# Patient Record
Sex: Female | Born: 1960 | Race: Black or African American | Hispanic: No | Marital: Single | Smoking: Current every day smoker
Health system: Southern US, Community
[De-identification: ages and names within clinical notes are randomized; demographics above are authoritative.]

## PROBLEM LIST (undated history)

## (undated) DIAGNOSIS — I10 Essential (primary) hypertension: Secondary | ICD-10-CM

## (undated) DIAGNOSIS — G43909 Migraine, unspecified, not intractable, without status migrainosus: Secondary | ICD-10-CM

## (undated) DIAGNOSIS — C801 Malignant (primary) neoplasm, unspecified: Secondary | ICD-10-CM

## (undated) DIAGNOSIS — G4733 Obstructive sleep apnea (adult) (pediatric): Secondary | ICD-10-CM

## (undated) DIAGNOSIS — F32A Depression, unspecified: Secondary | ICD-10-CM

## (undated) DIAGNOSIS — Z923 Personal history of irradiation: Secondary | ICD-10-CM

## (undated) DIAGNOSIS — T7840XA Allergy, unspecified, initial encounter: Secondary | ICD-10-CM

## (undated) DIAGNOSIS — R519 Headache, unspecified: Secondary | ICD-10-CM

## (undated) DIAGNOSIS — E785 Hyperlipidemia, unspecified: Secondary | ICD-10-CM

## (undated) DIAGNOSIS — F419 Anxiety disorder, unspecified: Secondary | ICD-10-CM

## (undated) DIAGNOSIS — C50919 Malignant neoplasm of unspecified site of unspecified female breast: Secondary | ICD-10-CM

## (undated) DIAGNOSIS — IMO0001 Reserved for inherently not codable concepts without codable children: Secondary | ICD-10-CM

## (undated) DIAGNOSIS — K5900 Constipation, unspecified: Secondary | ICD-10-CM

## (undated) DIAGNOSIS — R51 Headache: Secondary | ICD-10-CM

## (undated) DIAGNOSIS — F329 Major depressive disorder, single episode, unspecified: Secondary | ICD-10-CM

## (undated) DIAGNOSIS — E7800 Pure hypercholesterolemia, unspecified: Secondary | ICD-10-CM

## (undated) DIAGNOSIS — IMO0002 Reserved for concepts with insufficient information to code with codable children: Secondary | ICD-10-CM

## (undated) DIAGNOSIS — R921 Mammographic calcification found on diagnostic imaging of breast: Secondary | ICD-10-CM

## (undated) DIAGNOSIS — R569 Unspecified convulsions: Secondary | ICD-10-CM

## (undated) HISTORY — DX: Allergy, unspecified, initial encounter: T78.40XA

## (undated) HISTORY — DX: Reserved for concepts with insufficient information to code with codable children: IMO0002

## (undated) HISTORY — DX: Constipation, unspecified: K59.00

## (undated) HISTORY — PX: OTHER SURGICAL HISTORY: SHX169

## (undated) HISTORY — DX: Malignant (primary) neoplasm, unspecified: C80.1

## (undated) HISTORY — DX: Hyperlipidemia, unspecified: E78.5

## (undated) HISTORY — DX: Reserved for inherently not codable concepts without codable children: IMO0001

## (undated) HISTORY — DX: Unspecified convulsions: R56.9

## (undated) HISTORY — PX: COLONOSCOPY: SHX174

## (undated) HISTORY — DX: Obstructive sleep apnea (adult) (pediatric): G47.33

---

## 1998-08-15 HISTORY — PX: KNEE ARTHROSCOPY: SHX127

## 2001-03-22 ENCOUNTER — Other Ambulatory Visit: Admission: RE | Admit: 2001-03-22 | Discharge: 2001-03-22 | Payer: Self-pay | Admitting: *Deleted

## 2001-09-28 ENCOUNTER — Other Ambulatory Visit: Admission: RE | Admit: 2001-09-28 | Discharge: 2001-09-28 | Payer: Self-pay | Admitting: Obstetrics and Gynecology

## 2002-06-13 ENCOUNTER — Other Ambulatory Visit: Admission: RE | Admit: 2002-06-13 | Discharge: 2002-06-13 | Payer: Self-pay | Admitting: Obstetrics and Gynecology

## 2003-06-16 ENCOUNTER — Other Ambulatory Visit: Admission: RE | Admit: 2003-06-16 | Discharge: 2003-06-16 | Payer: Self-pay | Admitting: *Deleted

## 2003-06-16 ENCOUNTER — Other Ambulatory Visit: Admission: RE | Admit: 2003-06-16 | Discharge: 2003-06-16 | Payer: Self-pay | Admitting: Obstetrics and Gynecology

## 2004-05-18 ENCOUNTER — Encounter: Admission: RE | Admit: 2004-05-18 | Discharge: 2004-05-18 | Payer: Self-pay | Admitting: Emergency Medicine

## 2004-06-16 ENCOUNTER — Other Ambulatory Visit: Admission: RE | Admit: 2004-06-16 | Discharge: 2004-06-16 | Payer: Self-pay | Admitting: Obstetrics and Gynecology

## 2004-12-20 ENCOUNTER — Emergency Department (HOSPITAL_COMMUNITY): Admission: EM | Admit: 2004-12-20 | Discharge: 2004-12-20 | Payer: Self-pay | Admitting: *Deleted

## 2005-07-14 ENCOUNTER — Encounter: Admission: RE | Admit: 2005-07-14 | Discharge: 2005-07-14 | Payer: Self-pay | Admitting: Emergency Medicine

## 2005-07-18 ENCOUNTER — Other Ambulatory Visit: Admission: RE | Admit: 2005-07-18 | Discharge: 2005-07-18 | Payer: Self-pay | Admitting: Obstetrics and Gynecology

## 2006-08-18 ENCOUNTER — Encounter: Admission: RE | Admit: 2006-08-18 | Discharge: 2006-08-18 | Payer: Self-pay | Admitting: Emergency Medicine

## 2006-08-29 ENCOUNTER — Other Ambulatory Visit: Admission: RE | Admit: 2006-08-29 | Discharge: 2006-08-29 | Payer: Self-pay | Admitting: Obstetrics & Gynecology

## 2006-10-06 ENCOUNTER — Other Ambulatory Visit: Admission: RE | Admit: 2006-10-06 | Discharge: 2006-10-06 | Payer: Self-pay | Admitting: Obstetrics and Gynecology

## 2007-02-05 ENCOUNTER — Other Ambulatory Visit: Admission: RE | Admit: 2007-02-05 | Discharge: 2007-02-05 | Payer: Self-pay | Admitting: Obstetrics and Gynecology

## 2007-08-20 ENCOUNTER — Other Ambulatory Visit: Admission: RE | Admit: 2007-08-20 | Discharge: 2007-08-20 | Payer: Self-pay | Admitting: Obstetrics & Gynecology

## 2007-09-19 ENCOUNTER — Encounter: Admission: RE | Admit: 2007-09-19 | Discharge: 2007-09-19 | Payer: Self-pay | Admitting: Emergency Medicine

## 2008-10-06 ENCOUNTER — Other Ambulatory Visit: Admission: RE | Admit: 2008-10-06 | Discharge: 2008-10-06 | Payer: Self-pay | Admitting: Obstetrics and Gynecology

## 2008-10-15 ENCOUNTER — Encounter: Admission: RE | Admit: 2008-10-15 | Discharge: 2008-10-15 | Payer: Self-pay | Admitting: Emergency Medicine

## 2008-10-21 ENCOUNTER — Encounter: Admission: RE | Admit: 2008-10-21 | Discharge: 2008-10-21 | Payer: Self-pay | Admitting: Emergency Medicine

## 2009-10-19 ENCOUNTER — Encounter: Admission: RE | Admit: 2009-10-19 | Discharge: 2009-10-19 | Payer: Self-pay | Admitting: Obstetrics and Gynecology

## 2010-08-15 HISTORY — PX: VAGINAL HYSTERECTOMY: SUR661

## 2010-09-30 ENCOUNTER — Other Ambulatory Visit: Payer: Self-pay | Admitting: Family Medicine

## 2010-09-30 DIAGNOSIS — N6459 Other signs and symptoms in breast: Secondary | ICD-10-CM

## 2010-09-30 DIAGNOSIS — N63 Unspecified lump in unspecified breast: Secondary | ICD-10-CM

## 2010-10-06 ENCOUNTER — Other Ambulatory Visit: Payer: Self-pay | Admitting: Family Medicine

## 2010-10-06 ENCOUNTER — Ambulatory Visit
Admission: RE | Admit: 2010-10-06 | Discharge: 2010-10-06 | Disposition: A | Discharge status: Home / Self Care | Payer: Commercial Indemnity | Source: Ambulatory Visit | Attending: Family Medicine | Admitting: Family Medicine

## 2010-10-06 DIAGNOSIS — N63 Unspecified lump in unspecified breast: Secondary | ICD-10-CM

## 2010-10-06 DIAGNOSIS — N6320 Unspecified lump in the left breast, unspecified quadrant: Secondary | ICD-10-CM

## 2010-10-07 ENCOUNTER — Ambulatory Visit
Admission: RE | Admit: 2010-10-07 | Discharge: 2010-10-07 | Disposition: A | Discharge status: Home / Self Care | Payer: Commercial Indemnity | Source: Ambulatory Visit | Attending: Family Medicine | Admitting: Family Medicine

## 2010-10-07 ENCOUNTER — Other Ambulatory Visit: Payer: Self-pay | Admitting: Diagnostic Radiology

## 2010-10-07 DIAGNOSIS — N6320 Unspecified lump in the left breast, unspecified quadrant: Secondary | ICD-10-CM

## 2011-03-23 NOTE — Patient Instructions (Addendum)
20 Eileen Holland  03/24/2011   Your procedure is scheduled on:  03/29/11  Report to [facility] at 6 AM.  Call this number if you have problems the morning of surgery: [phone]   Remember:   Do not eat food:After Midnight.  Do not drink clear liquids: After Midnight.  Take these medicines the morning of surgery with A SIP OF WATER: all blood pressure medications   Do not wear jewelry, make-up or nail polish.  Do not wear lotions, powders, or perfumes. You may wear deodorant.  Do not shave 48 hours prior to surgery.  Do not bring valuables to the hospital.  Contacts, dentures or bridgework may not be worn into surgery.  Leave suitcase in the car. After surgery it may be brought to your room.  For patients admitted to the hospital, checkout time is 11:00 AM the day of discharge.   Patients discharged the day of surgery will not be allowed to drive home.  Name and phone number of your driver: NA  Special Instructions: use CHG wash as directed on written instruction sheet   Please read over the following fact sheets that you were given: MRSA Information

## 2011-03-24 ENCOUNTER — Encounter (HOSPITAL_COMMUNITY): Payer: Self-pay

## 2011-03-24 ENCOUNTER — Encounter (HOSPITAL_COMMUNITY)
Admission: RE | Admit: 2011-03-24 | Discharge: 2011-03-24 | Disposition: A | Discharge status: Home / Self Care | Payer: Managed Care, Other (non HMO) | Source: Ambulatory Visit | Attending: Obstetrics and Gynecology | Admitting: Obstetrics and Gynecology

## 2011-03-24 ENCOUNTER — Other Ambulatory Visit: Payer: Self-pay

## 2011-03-24 HISTORY — DX: Anxiety disorder, unspecified: F41.9

## 2011-03-24 HISTORY — DX: Major depressive disorder, single episode, unspecified: F32.9

## 2011-03-24 HISTORY — DX: Depression, unspecified: F32.A

## 2011-03-24 HISTORY — DX: Essential (primary) hypertension: I10

## 2011-03-24 LAB — CBC
HCT: 39 % (ref 36.0–46.0)
Hemoglobin: 13.1 g/dL (ref 12.0–15.0)
RBC: 3.66 MIL/uL — ABNORMAL LOW (ref 3.87–5.11)

## 2011-03-24 LAB — COMPREHENSIVE METABOLIC PANEL
ALT: 8 U/L (ref 0–35)
Alkaline Phosphatase: 61 U/L (ref 39–117)
CO2: 32 mEq/L (ref 19–32)
Chloride: 99 mEq/L (ref 96–112)
GFR calc Af Amer: >60 mL/min (ref >60)
GFR calc non Af Amer: >60 mL/min (ref >60)
Glucose, Bld: 95 mg/dL (ref 70–99)
Potassium: 4 mEq/L (ref 3.5–5.1)
Sodium: 135 mEq/L (ref 135–145)
Total Bilirubin: 0.4 mg/dL (ref 0.3–1.2)
Total Protein: 7.5 g/dL (ref 6.0–8.3)

## 2011-03-24 NOTE — Anesthesia Preprocedure Evaluation (Signed)
Anesthesia Evaluation  Name, MR# and DOB Patient awake  General Assessment Comment  Reviewed: Allergy & Precautions, H&P , Patient's Chart, lab work & pertinent test results, reviewed documented beta blocker date and time   History of Anesthesia Complications Negative for: history of anesthetic complications  Airway Mallampati: II TM Distance: >3 FB Neck ROM: full    Dental No notable dental hx.    Pulmonary  clear to auscultation  pulmonary exam normalPulmonary Exam Normal breath sounds clear to auscultation none    Cardiovascular Exercise Tolerance: Good hypertension, regular Normal    Neuro/Psych Negative Neurological ROS  Negative Psych ROS  GI/Hepatic/Renal negative GI ROS, negative Liver ROS, and negative Renal ROS (+)       Endo/Other  Negative Endocrine ROS (+)      Abdominal   Musculoskeletal   Hematology negative hematology ROS (+)   Peds  Reproductive/Obstetrics negative OB ROS    Anesthesia Other Findings             Anesthesia Physical Anesthesia Plan  ASA: II  Anesthesia Plan: General   Post-op Pain Management:    Induction:   Airway Management Planned:   Additional Equipment:   Intra-op Plan:   Post-operative Plan:   Informed Consent: I have reviewed the patients History and Physical, chart, labs and discussed the procedure including the risks, benefits and alternatives for the proposed anesthesia with the patient or authorized representative who has indicated his/her understanding and acceptance.   Dental Advisory Given  Plan Discussed with: CRNA and Surgeon  Anesthesia Plan Comments:         Anesthesia Quick Evaluation

## 2011-03-28 MED ORDER — DEXTROSE 5 % IV SOLN
2.0000 g | Freq: Once | INTRAVENOUS | Status: AC
  Administered 2011-03-29: 2 g via INTRAVENOUS
  Filled 2011-03-28: qty 2

## 2011-03-28 NOTE — H&P (Signed)
Eileen Holland is an 50 y.o. femalePatient has an enlarging uterus, and is now having pain into her buttock that may be from the bulk of the uterus. She has numerous fibroids ranging from 1 to 4 cm each, and her uterus is 10 x 6 x 10 cm on PUS.  The right ovary had a 3.7 cm clear cyst.  Left nl.     Pertinent Gynecological History: Menses: 2 times per month with associated pain. Bleeding: dysfunctional uterine bleeding Contraception:micronor OB History: G1 P1 SVD   Menstrual History:  No LMP recorded. 03/05/2011  Past Medical History  Diagnosis Date  . Hypertension   . Depression   . Anxiety     Past Surgical History  Procedure Date  . Knee arthroscopy 2000    No family history on file.  Social History:  reports that she has been smoking.  She does not have any smokeless tobacco history on file. She reports that she drinks alcohol. She reports that she does not use illicit drugs.  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  Not sexually active.  Allergies:  Allergies  Allergen Reactions  . Aspirin Hives  . Ibuprofen Hives    No prescriptions prior to admission    Review of Systems  All other systems reviewed and are negative.    There were no vitals taken for this visit.5 feet 3 inches, 149 pounds. Physical Exam  Constitutional: She is oriented to person, place, and time. She appears well-developed and well-nourished.  HENT:  Head: Normocephalic and atraumatic.  Eyes: Pupils are equal, round, and reactive to light.  Neck: Normal range of motion. Neck supple.  Cardiovascular: Normal rate and regular rhythm.   Respiratory: Effort normal and breath sounds normal.  GI: Soft. Bowel sounds are normal.  Genitourinary: Vagina normal.       Uterus 14-16 week size, nodular, mobile.  Adnexa neg.  Musculoskeletal: Normal range of motion.  Neurological: She is alert and oriented to person, place, and time.  Skin: Skin is warm and dry.  Psychiatric: She has a normal mood and affect.    No  results found for this or any previous visit (from the past 24 hour(s)).  No results found.  Assessment/Plan: 50 yr old SBF with enlarging and symptomatic fibroids, for R TLH.  Plan to retain ovaries if no.   ROMINE,CYNTHIA P 03/28/2011, 5:25 PM

## 2011-03-29 ENCOUNTER — Other Ambulatory Visit: Payer: Self-pay | Admitting: Obstetrics and Gynecology

## 2011-03-29 ENCOUNTER — Ambulatory Visit (HOSPITAL_COMMUNITY): Payer: Managed Care, Other (non HMO) | Admitting: Anesthesiology

## 2011-03-29 ENCOUNTER — Encounter (HOSPITAL_COMMUNITY): Payer: Self-pay | Admitting: Anesthesiology

## 2011-03-29 ENCOUNTER — Encounter (HOSPITAL_COMMUNITY): Payer: Self-pay | Admitting: *Deleted

## 2011-03-29 ENCOUNTER — Encounter (HOSPITAL_COMMUNITY)
Admission: RE | Disposition: A | Discharge status: Home / Self Care | Payer: Self-pay | Source: Ambulatory Visit | Attending: Obstetrics and Gynecology

## 2011-03-29 ENCOUNTER — Ambulatory Visit (HOSPITAL_COMMUNITY)
Admission: RE | Admit: 2011-03-29 | Discharge: 2011-03-29 | Disposition: A | Discharge status: Home / Self Care | Payer: Managed Care, Other (non HMO) | Source: Ambulatory Visit | Attending: Obstetrics and Gynecology | Admitting: Obstetrics and Gynecology

## 2011-03-29 DIAGNOSIS — Z01812 Encounter for preprocedural laboratory examination: Secondary | ICD-10-CM | POA: Insufficient documentation

## 2011-03-29 DIAGNOSIS — N920 Excessive and frequent menstruation with regular cycle: Secondary | ICD-10-CM | POA: Insufficient documentation

## 2011-03-29 DIAGNOSIS — Z01818 Encounter for other preprocedural examination: Secondary | ICD-10-CM | POA: Insufficient documentation

## 2011-03-29 DIAGNOSIS — D259 Leiomyoma of uterus, unspecified: Secondary | ICD-10-CM | POA: Insufficient documentation

## 2011-03-29 SURGERY — ROBOTIC ASSISTED TOTAL HYSTERECTOMY
Anesthesia: General | Site: Abdomen | Wound class: Clean Contaminated

## 2011-03-29 MED ORDER — NEOSTIGMINE METHYLSULFATE 1 MG/ML IJ SOLN
INTRAMUSCULAR | Status: AC
  Filled 2011-03-29: qty 10

## 2011-03-29 MED ORDER — SODIUM CHLORIDE 0.9 % IV SOLN
250.0000 mL | INTRAVENOUS | Status: DC
  Administered 2011-03-29: 10 mL via INTRAVENOUS

## 2011-03-29 MED ORDER — SODIUM CHLORIDE 0.9 % IJ SOLN: 3.0000 mL | INTRAMUSCULAR | Status: DC | PRN

## 2011-03-29 MED ORDER — DEXAMETHASONE SODIUM PHOSPHATE 10 MG/ML IJ SOLN
INTRAMUSCULAR | Status: DC | PRN
  Administered 2011-03-29: 10 mg via INTRAVENOUS

## 2011-03-29 MED ORDER — ONDANSETRON HCL 4 MG/2ML IJ SOLN
INTRAMUSCULAR | Status: DC | PRN
  Administered 2011-03-29: 4 mg via INTRAVENOUS

## 2011-03-29 MED ORDER — GLYCOPYRROLATE 0.2 MG/ML IJ SOLN
INTRAMUSCULAR | Status: DC | PRN
  Administered 2011-03-29: .6 mg via INTRAVENOUS

## 2011-03-29 MED ORDER — MIDAZOLAM HCL 5 MG/5ML IJ SOLN
INTRAMUSCULAR | Status: DC | PRN
  Administered 2011-03-29: 2 mg via INTRAVENOUS

## 2011-03-29 MED ORDER — PROMETHAZINE HCL 25 MG/ML IJ SOLN: 12.5000 mg | INTRAMUSCULAR | Status: DC | PRN

## 2011-03-29 MED ORDER — FENTANYL CITRATE 0.05 MG/ML IJ SOLN
25.0000 ug | INTRAMUSCULAR | Status: DC | PRN
  Administered 2011-03-29: 25 ug via INTRAVENOUS

## 2011-03-29 MED ORDER — LACTATED RINGERS IR SOLN
Status: DC | PRN
  Administered 2011-03-29: 3000 mL

## 2011-03-29 MED ORDER — SUFENTANIL CITRATE 50 MCG/ML IV SOLN
INTRAVENOUS | Status: DC | PRN
  Administered 2011-03-29: 20 ug via INTRAVENOUS
  Administered 2011-03-29 (×3): 10 ug via INTRAVENOUS

## 2011-03-29 MED ORDER — PROPOFOL 10 MG/ML IV EMUL
INTRAVENOUS | Status: AC
  Filled 2011-03-29: qty 40

## 2011-03-29 MED ORDER — MIDAZOLAM HCL 2 MG/2ML IJ SOLN
INTRAMUSCULAR | Status: AC
  Filled 2011-03-29: qty 2

## 2011-03-29 MED ORDER — SCOPOLAMINE 1 MG/3DAYS TD PT72
MEDICATED_PATCH | TRANSDERMAL | Status: AC
  Filled 2011-03-29: qty 1

## 2011-03-29 MED ORDER — SCOPOLAMINE 1 MG/3DAYS TD PT72: 1.0000 | MEDICATED_PATCH | TRANSDERMAL | Status: DC

## 2011-03-29 MED ORDER — LIDOCAINE HCL (CARDIAC) 20 MG/ML IV SOLN
INTRAVENOUS | Status: AC
  Filled 2011-03-29: qty 5

## 2011-03-29 MED ORDER — FENTANYL CITRATE 0.05 MG/ML IJ SOLN
INTRAMUSCULAR | Status: AC
  Filled 2011-03-29: qty 2

## 2011-03-29 MED ORDER — DEXAMETHASONE SODIUM PHOSPHATE 10 MG/ML IJ SOLN
INTRAMUSCULAR | Status: AC
  Filled 2011-03-29: qty 1

## 2011-03-29 MED ORDER — MORPHINE SULFATE 4 MG/ML IJ SOLN: 1.0000 mg | INTRAMUSCULAR | Status: DC | PRN

## 2011-03-29 MED ORDER — SUFENTANIL CITRATE 50 MCG/ML IV SOLN
INTRAVENOUS | Status: AC
  Filled 2011-03-29: qty 1

## 2011-03-29 MED ORDER — SODIUM CHLORIDE 0.9 % IV SOLN: 250.0000 mL | INTRAVENOUS | Status: DC

## 2011-03-29 MED ORDER — LIDOCAINE HCL (CARDIAC) 20 MG/ML IV SOLN
INTRAVENOUS | Status: DC | PRN
  Administered 2011-03-29: 60 mg via INTRAVENOUS

## 2011-03-29 MED ORDER — GLYCOPYRROLATE 0.2 MG/ML IJ SOLN
INTRAMUSCULAR | Status: AC
  Filled 2011-03-29: qty 2

## 2011-03-29 MED ORDER — DEXTROSE IN LACTATED RINGERS 5 % IV SOLN: INTRAVENOUS | Status: DC

## 2011-03-29 MED ORDER — ROCURONIUM BROMIDE 50 MG/5ML IV SOLN
INTRAVENOUS | Status: AC
  Filled 2011-03-29: qty 1

## 2011-03-29 MED ORDER — NEOSTIGMINE METHYLSULFATE 1 MG/ML IJ SOLN
INTRAMUSCULAR | Status: DC | PRN
  Administered 2011-03-29: 3 mg via INTRAMUSCULAR

## 2011-03-29 MED ORDER — ONDANSETRON HCL 4 MG/2ML IJ SOLN: 4.0000 mg | Freq: Once | INTRAMUSCULAR | Status: DC | PRN

## 2011-03-29 MED ORDER — LACTATED RINGERS IV SOLN
INTRAVENOUS | Status: DC
Start: 2011-03-29 — End: 2011-03-29
  Administered 2011-03-29 (×3): via INTRAVENOUS

## 2011-03-29 MED ORDER — ROPIVACAINE HCL 5 MG/ML IJ SOLN
INTRAMUSCULAR | Status: DC | PRN
  Administered 2011-03-29: 110 mL

## 2011-03-29 MED ORDER — SODIUM CHLORIDE 0.9 % IJ SOLN: 3.0000 mL | Freq: Two times a day (BID) | INTRAMUSCULAR | Status: DC

## 2011-03-29 MED ORDER — PROPOFOL 10 MG/ML IV EMUL
INTRAVENOUS | Status: DC | PRN
  Administered 2011-03-29: 50 mg via INTRAVENOUS
  Administered 2011-03-29: 150 mg via INTRAVENOUS

## 2011-03-29 MED ORDER — OXYCODONE-ACETAMINOPHEN 5-325 MG PO TABS
1.0000 | ORAL_TABLET | ORAL | Status: DC | PRN
Start: 2011-03-29 — End: 2011-03-29

## 2011-03-29 MED ORDER — ONDANSETRON HCL 4 MG/2ML IJ SOLN
INTRAMUSCULAR | Status: AC
  Filled 2011-03-29: qty 2

## 2011-03-29 MED ORDER — MUPIROCIN 2 % EX OINT
TOPICAL_OINTMENT | CUTANEOUS | Status: AC
  Filled 2011-03-29: qty 22

## 2011-03-29 MED ORDER — ROCURONIUM BROMIDE 100 MG/10ML IV SOLN
INTRAVENOUS | Status: DC | PRN
  Administered 2011-03-29 (×2): 10 mg via INTRAVENOUS
  Administered 2011-03-29: 20 mg via INTRAVENOUS
  Administered 2011-03-29: 50 mg via INTRAVENOUS

## 2011-03-29 SURGICAL SUPPLY — 58 items
APL SKNCLS STERI-STRIP NONHPOA (GAUZE/BANDAGES/DRESSINGS) ×1
BARRIER ADHS 3X4 INTERCEED (GAUZE/BANDAGES/DRESSINGS) ×2 IMPLANT
BENZOIN TINCTURE PRP APPL 2/3 (GAUZE/BANDAGES/DRESSINGS) ×2 IMPLANT
BLADELESS LONG 8MM (BLADE) IMPLANT
BRR ADH 4X3 ABS CNTRL BYND (GAUZE/BANDAGES/DRESSINGS) ×1
CABLE HIGH FREQUENCY MONO STRZ (ELECTRODE) ×2 IMPLANT
CONT PATH 16OZ SNAP LID 3702 (MISCELLANEOUS) ×2 IMPLANT
COVER MAYO STAND STRL (DRAPES) ×2 IMPLANT
COVER TABLE BACK 60X90 (DRAPES) ×4 IMPLANT
COVER TIP SHEARS 8 DVNC (MISCELLANEOUS) ×1 IMPLANT
COVER TIP SHEARS 8MM DA VINCI (MISCELLANEOUS) ×1
DECANTER SPIKE VIAL GLASS SM (MISCELLANEOUS) ×3 IMPLANT
DRAPE HUG U DISPOSABLE (DRAPE) ×2 IMPLANT
DRAPE LG THREE QUARTER DISP (DRAPES) ×4 IMPLANT
DRAPE MONITOR DA VINCI (DRAPE) ×2 IMPLANT
DRAPE UTILITY XL STRL (DRAPES) ×1 IMPLANT
DRAPE WARM FLUID 44X44 (DRAPE) ×2 IMPLANT
ELECT REM PT RETURN 9FT ADLT (ELECTROSURGICAL) ×2
ELECTRODE REM PT RTRN 9FT ADLT (ELECTROSURGICAL) ×1 IMPLANT
EVACUATOR SMOKE 8.L (FILTER) ×2 IMPLANT
GAUZE VASELINE 3X9 (GAUZE/BANDAGES/DRESSINGS) IMPLANT
GLOVE BIO SURGEON STRL SZ 6.5 (GLOVE) ×6 IMPLANT
GLOVE BIOGEL PI IND STRL 7.0 (GLOVE) ×4 IMPLANT
GLOVE BIOGEL PI INDICATOR 7.0 (GLOVE) ×4
GLOVE ECLIPSE 6.5 STRL STRAW (GLOVE) ×9 IMPLANT
GOWN PREVENTION PLUS LG XLONG (DISPOSABLE) ×14 IMPLANT
KIT DISP ACCESSORY 4 ARM (KITS) ×2 IMPLANT
NDL INSUFFLATION 14GA 120MM (NEEDLE) ×1 IMPLANT
NEEDLE INSUFFLATION 14GA 120MM (NEEDLE) ×2 IMPLANT
NS IRRIG 1000ML POUR BTL (IV SOLUTION) ×3 IMPLANT
OCCLUDER COLPOPNEUMO (BALLOONS) ×1 IMPLANT
PACK LAVH (CUSTOM PROCEDURE TRAY) ×2 IMPLANT
PAD PREP 24X48 CUFFED NSTRL (MISCELLANEOUS) ×4 IMPLANT
SET IRRIG TUBING LAPAROSCOPIC (IRRIGATION / IRRIGATOR) ×2 IMPLANT
SOLUTION ELECTROLUBE (MISCELLANEOUS) ×2 IMPLANT
SPONGE LAP 18X18 X RAY DECT (DISPOSABLE) IMPLANT
STRIP CLOSURE SKIN 1/4X4 (GAUZE/BANDAGES/DRESSINGS) ×2 IMPLANT
SUT VIC AB 0 CT1 27 (SUTURE) ×10
SUT VIC AB 0 CT1 27XBRD ANTBC (SUTURE) ×5 IMPLANT
SUT VICRYL 0 UR6 27IN ABS (SUTURE) ×2 IMPLANT
SUT VICRYL RAPIDE 4/0 PS 2 (SUTURE) ×4 IMPLANT
SYR 50ML LL SCALE MARK (SYRINGE) ×2 IMPLANT
SYSTEM CONVERTIBLE TROCAR (TROCAR) IMPLANT
TIP RUMI ORANGE 6.7MMX12CM (TIP) ×1 IMPLANT
TIP UTERINE 5.1X6CM LAV DISP (MISCELLANEOUS) IMPLANT
TIP UTERINE 6.7X10CM GRN DISP (MISCELLANEOUS) IMPLANT
TIP UTERINE 6.7X6CM WHT DISP (MISCELLANEOUS) IMPLANT
TIP UTERINE 6.7X8CM BLUE DISP (MISCELLANEOUS) IMPLANT
TOWEL OR 17X24 6PK STRL BLUE (TOWEL DISPOSABLE) ×6 IMPLANT
TRAY FOLEY BAG SILVER LF 14FR (CATHETERS) ×2 IMPLANT
TROCAR 12M 150ML BLUNT (TROCAR) ×2 IMPLANT
TROCAR DISP BLADELESS 8 DVNC (TROCAR) ×1 IMPLANT
TROCAR DISP BLADELESS 8MM (TROCAR) ×1
TROCAR XCEL 12X100 BLDLESS (ENDOMECHANICALS) ×2 IMPLANT
TROCAR Z-THREAD 12X150 (TROCAR) ×1 IMPLANT
TUBING FILTER THERMOFLATOR (ELECTROSURGICAL) ×2 IMPLANT
WARMER LAPAROSCOPE (MISCELLANEOUS) ×2 IMPLANT
WATER STERILE IRR 1000ML POUR (IV SOLUTION) ×3 IMPLANT

## 2011-03-29 NOTE — Op Note (Signed)
Preoperative diagnosis menorrhagia and uterine fibroids Postoperative diagnosis same path pending Procedure robotic assisted total laparoscopic hysterectomy Surgeon Dr. Aram Beecham Taneeka Curtner Asst. Dr. Leda Quail Anesthesia Gen. endotracheal Estimated blood loss 50 cc Complications none Procedure The patient was taken to the operating room and after induction of adequate general endotracheal anesthesia was placed in the dorsolithotomy position and prepped and draped in the usual fashion.  A posterior weighted and anterior Deaver were placed and the cervix was grasped on its anterior lip with a single-tooth tenaculum.  The cervix was dilated to #23 Shawnie Pons.  The uterus sounded to 12 cm.  A 12 cm roomy manipulator with a medium Koh ring was inserted into the uterus without difficulty.  A Foley catheter was inserted.  Attention was next turned to the abdomen.  An area approximately 2 fingerbreadths above the umbilicus was infiltrated with half percent ropivacaine that had been diluted one-to-one with sterile saline.  The skin was incised with a knife.  A Veress needle was inserted into the peritoneal space.  Proper placement of the Veress needle was tested by noting a negative aspirate, and free flow of saline through the verres needle again with a negative aspirate,, and then by noting the response of a drop of saline placed at the hub of the Verres needle as the abdominal wall was elevated.  Pneumoperitoneum was created with 4 L of CO2 using the automatic insufflator.  A 12 mm bladed trocar was then inserted and the peritoneal space in its proper placement noted with the laparoscope.  60 cc of a dilute ropivacaine solution were then injected into the pelvis.  The planned sites for the 2 robotic ports and the assistant's port were marked with a marking pen and then transilluminated using the laparoscope to ensure no visible nearby vasculature.  The sites were infiltrated with a dilute ropivacaine solution, incised  with a knife, and the trochars were all inserted under direct visualization.  The robot was then brought in and out from the side.  The monopolar scissors were placed and port one in the PK gyrus was placed and port 2 and these were followed in to the pelvis using direct visualization.  The surgeon then proceeded to the consult.  The ureters were identified bilaterally and could be seen peristalsing in their placement in the pelvis noted.  The ovaries were inspected and were freely mobile and felt to be normal.  The utero-ovarian ligament tube and mesosalpinx and round ligaments were cauterized and cut.  The anterior and posterior leaf of the broad ligament on the patient's right wall were opened.  The bladder was dissected off the cervix sharply.  The uterine artery on the patient's right was skeletonized cauterized multiple times and cut.  Attention was then turned to the patient's left again the ureter was identified.  The utero-ovarian ligament to mesosalpinx and round ligament were cauterized and then cut.  Interlooped and posterior leafs of the broad ligament were taken down sharply.  Further dissection was done to ensure that the bladder was dissected free off the cervix and again the uterine artery on the left was skeletonized.  It was cauterized multiple times and then cut.  The uterus was felt to be approximately 14 weeks size with multiple fibroids.  Since it was too large to be delivered through the vagina intact several of the fibroids were dissected so that they tangled on a small stalk from their attachments in the uterus to enable the uterus to fold and then come  out through the vagina.  When the uterus was pulled through the vagina it was necessary to completely remove 2 of these fibroids to enable the uterus to be delivered through the vagina and 2 fibroids were also delivered into the vagina.  The robotic instruments were then changed out for a Prograsp and port 2 and a mega-suture cut needle  holder in port one.  3 sutures of 0 Vicryl in a figure-of-eight fashion were used to close the vaginal cuff.  The pelvis was then irrigated and found to be hemostatic.  The ureters were again identified on both sides and seen peristalsing freely.  A sheet of intercede was then placed over the vaginal cuff.  The instruments were removed from the abdomen. the trocar sleeves were removed under direct visualization.  Pneumoperitoneum was allowed to escape through the trocar sleeve above the umbilicus prior to its removal.  The fascia at the umbilicus and a 12 mm assistant's port were closed with a single suture of 0 Vicryl.  The skin was closed with 4-0 Vicryl Rapide at all 4 incisions.  Benzoin and Steri-Strips were applied.  The vagina was wiped clean of clots with a sponge.  The Foley catheter is removed.  Sponge needle instrument counts were correct x3.  The patient was taken to the recovery room in satisfactory condition.

## 2011-03-29 NOTE — Anesthesia Postprocedure Evaluation (Signed)
  Anesthesia Post-op Note  Patient: Eileen Holland  Procedure(s) Performed:  ROBOTIC ASSISTED TOTAL HYSTERECTOMY  Patient Location: womens unit  Anesthesia Type: General  Level of Consciousness: awake, alert  and oriented  Airway and Oxygen Therapy: Patient Spontanous Breathing  Post-op Pain: mild  Post-op Assessment: Post-op Vital signs reviewed, Patient's Cardiovascular Status Stable and Respiratory Function Stable  Post-op Vital Signs: Reviewed  Complications: No apparent anesthesia complications

## 2011-03-29 NOTE — Anesthesia Postprocedure Evaluation (Signed)
  Anesthesia Post-op Note  Patient: Eileen Holland  Procedure(s) Performed:  ROBOTIC ASSISTED TOTAL HYSTERECTOMY  Patient Location: Women's Unit  Anesthesia Type: General  Level of Consciousness: awake, alert  and oriented  Airway and Oxygen Therapy: Patient Spontanous Breathing  Post-op Pain: mild  Post-op Assessment: Patient's Cardiovascular Status Stable and Respiratory Function Stable  Post-op Vital Signs: Reviewed and stable  Complications: No apparent anesthesia complications

## 2011-03-29 NOTE — Transfer of Care (Signed)
Immediate Anesthesia Transfer of Care Note  Patient: Eileen Holland  Procedure(s) Performed:  ROBOTIC ASSISTED TOTAL HYSTERECTOMY  Patient Location: PACU  Anesthesia Type: General  Level of Consciousness: alert , oriented and sedated  Airway & Oxygen Therapy: Patient Spontanous Breathing and Patient connected to nasal cannula oxygen  Post-op Assessment: Report given to PACU RN and Post -op Vital signs reviewed and stable  Post vital signs: Reviewed and stable  Complications: No apparent anesthesia complications

## 2011-06-14 NOTE — Discharge Summary (Signed)
Physician Discharge Summary  Patient ID: Eileen Holland MRN: [id number] DOB/AGE: 09/18/1960 50 y.o.  Admit date: 03/29/2011 Discharge date: 06/14/2011  Admission Diagnoses:  Discharge Diagnoses:  Active Problems:  * No active hospital problems. *    Discharged Condition: good  Hospital Course: 50 yo SBF with 14-16 week fibroids.  Pt had a R-TLH on 03-29-11 without complication.  Pathology revealed a 393g uterus, with multiple myomas.  Pt was discharged the afternoon of surgery with full discharge instructions.  Consults: none  Significant Diagnostic Studies:none  Treatments: IV hydration, surgery  Discharge Exam: Blood pressure 135/77, pulse 53, temperature 97.6 F (36.4 C), temperature source Oral, resp. rate 14, height 5\' 3"  (1.6 m), weight 66.225 kg (146 lb), last menstrual period 03/14/2011, SpO2 98.00%. General appearance: alert  Disposition: Home or Self Care   Discharge Medication List as of 03/29/2011  3:14 PM    CONTINUE these medications which have NOT CHANGED   Details  ACETAMINOPHEN-BUTALBITAL 50-325 MG TABS Take 1 tablet by mouth every 4 (four) hours as needed. For pain  , Until Discontinued, Historical Med    fluticasone (VERAMYST) 27.5 MCG/SPRAY nasal spray Place 2 sprays into the nose daily.  , Until Discontinued, Historical Med    hydrochlorothiazide 25 MG tablet Take 25 mg by mouth daily.  , Until Discontinued, Historical Med    levocetirizine (XYZAL) 5 MG tablet Take 5 mg by mouth daily as needed. For allergies  , Until Discontinued, Historical Med    losartan (COZAAR) 50 MG tablet Take 50 mg by mouth daily.  , Until Discontinued, Historical Med    methocarbamol (ROBAXIN) 500 MG tablet Take 500 mg by mouth 4 (four) times daily.  , Until Discontinued, Historical Med    metoprolol (TOPROL-XL) 50 MG 24 hr tablet Take 50 mg by mouth daily.  , Until Discontinued, Historical Med    Multiple Vitamins-Minerals (STRESS TAB NF PO) Take 1 tablet by mouth daily.   , Until Discontinued, Historical Med    multivitamin (THERAGRAN) per tablet Take 1 tablet by mouth daily.  , Until Discontinued, Historical Med    PARoxetine (PAXIL) 20 MG tablet Take 20 mg by mouth every morning.  , Until Discontinued, Historical Med    DOCUSATE SODIUM PO Take 200 mg by mouth daily as needed. For stool softener , Until Discontinued, Historical Med      STOP taking these medications     norethindrone (MICRONOR,CAMILA,ERRIN) 0.35 MG tablet        Follow-up Information    Follow up with ROMINE,CYNTHIA P. (as scheduled)    Contact information:   [address] [place] [address] [phone]          Signed: Alison Murray 06/14/2011, 4:15 PM

## 2013-06-04 DIAGNOSIS — F325 Major depressive disorder, single episode, in full remission: Secondary | ICD-10-CM | POA: Insufficient documentation

## 2013-06-04 DIAGNOSIS — I10 Essential (primary) hypertension: Secondary | ICD-10-CM | POA: Insufficient documentation

## 2013-06-04 DIAGNOSIS — Z720 Tobacco use: Secondary | ICD-10-CM | POA: Diagnosis present

## 2013-06-04 DIAGNOSIS — E785 Hyperlipidemia, unspecified: Secondary | ICD-10-CM | POA: Insufficient documentation

## 2014-03-18 ENCOUNTER — Encounter: Payer: Self-pay | Admitting: Gastroenterology

## 2014-04-08 ENCOUNTER — Telehealth: Payer: Self-pay | Admitting: Oncology

## 2014-04-08 NOTE — Telephone Encounter (Signed)
PER PATIENT WANT TO BE SEEN AT THE [facility] LOCATION

## 2014-05-01 ENCOUNTER — Ambulatory Visit (AMBULATORY_SURGERY_CENTER): Payer: Managed Care, Other (non HMO) | Admitting: *Deleted

## 2014-05-01 VITALS — Ht 62.5 in | Wt 163.8 lb

## 2014-05-01 DIAGNOSIS — Z1211 Encounter for screening for malignant neoplasm of colon: Secondary | ICD-10-CM

## 2014-05-01 MED ORDER — MOVIPREP 100 G PO SOLR
ORAL | Status: DC
Start: 2014-05-01 — End: 2014-05-15

## 2014-05-01 NOTE — Progress Notes (Signed)
No allergies to eggs or soy. No problems with anesthesia.  Pt given Emmi instructions for colonoscopy  No oxygen use  No diet drug use  

## 2014-05-09 ENCOUNTER — Encounter: Payer: Self-pay | Admitting: Gastroenterology

## 2014-05-14 ENCOUNTER — Ambulatory Visit (HOSPITAL_BASED_OUTPATIENT_CLINIC_OR_DEPARTMENT_OTHER): Payer: Managed Care, Other (non HMO) | Admitting: Hematology & Oncology

## 2014-05-14 ENCOUNTER — Ambulatory Visit: Payer: Managed Care, Other (non HMO)

## 2014-05-14 ENCOUNTER — Encounter: Payer: Self-pay | Admitting: Hematology & Oncology

## 2014-05-14 ENCOUNTER — Other Ambulatory Visit (HOSPITAL_BASED_OUTPATIENT_CLINIC_OR_DEPARTMENT_OTHER): Payer: Managed Care, Other (non HMO) | Admitting: Lab

## 2014-05-14 VITALS — BP 131/79 | HR 66 | Temp 97.9°F | Resp 14 | Ht 64.0 in | Wt 164.0 lb

## 2014-05-14 DIAGNOSIS — D7589 Other specified diseases of blood and blood-forming organs: Secondary | ICD-10-CM

## 2014-05-14 DIAGNOSIS — R05 Cough: Secondary | ICD-10-CM

## 2014-05-14 DIAGNOSIS — R059 Cough, unspecified: Secondary | ICD-10-CM

## 2014-05-14 DIAGNOSIS — F17200 Nicotine dependence, unspecified, uncomplicated: Secondary | ICD-10-CM

## 2014-05-14 DIAGNOSIS — Z801 Family history of malignant neoplasm of trachea, bronchus and lung: Secondary | ICD-10-CM

## 2014-05-14 DIAGNOSIS — Z803 Family history of malignant neoplasm of breast: Secondary | ICD-10-CM

## 2014-05-14 DIAGNOSIS — R058 Other specified cough: Secondary | ICD-10-CM

## 2014-05-14 LAB — CBC WITH DIFFERENTIAL (CANCER CENTER ONLY)
BASO#: 0.1 10*3/uL (ref 0.0–0.2)
BASO%: 0.5 % (ref 0.0–2.0)
EOS%: 1.6 % (ref 0.0–7.0)
Eosinophils Absolute: 0.2 10*3/uL (ref 0.0–0.5)
HCT: 37.1 % (ref 34.8–46.6)
HEMOGLOBIN: 12.7 g/dL (ref 11.6–15.9)
LYMPH#: 2.5 10*3/uL (ref 0.9–3.3)
LYMPH%: 22.6 % (ref 14.0–48.0)
MCH: 35.4 pg — AB (ref 26.0–34.0)
MCHC: 34.2 g/dL (ref 32.0–36.0)
MCV: 103 fL — AB (ref 81–101)
MONO#: 0.7 10*3/uL (ref 0.1–0.9)
MONO%: 6.1 % (ref 0.0–13.0)
NEUT%: 69.2 % (ref 39.6–80.0)
NEUTROS ABS: 7.6 10*3/uL — AB (ref 1.5–6.5)
Platelets: 229 10*3/uL (ref 145–400)
RBC: 3.59 10*6/uL — ABNORMAL LOW (ref 3.70–5.32)
RDW: 11.2 % (ref 11.1–15.7)
WBC: 10.9 10*3/uL — ABNORMAL HIGH (ref 3.9–10.0)

## 2014-05-14 LAB — CHCC SATELLITE - SMEAR

## 2014-05-14 NOTE — Progress Notes (Signed)
Referral MD  Reason for Referral: Macrocytic red cell index without anemia   Chief Complaint  Patient presents with  . New pt  : I was told that my blood is abnormal  HPI: Eileen Holland is a very charming 53 year old African American female. She is originally from [place]. She's been down here for about 18 years. She works in a lab doing toxicology studies.  She is followed by  [facility]. She is not anemic. She has been in good health. However, she was found to have a macrocytic index with her red blood cells.  Lab work done back in July showed a white cell count 8.6. Hemoglobin 12.1. Hematocrit 36.1. Platelet count was 243. Her MCV was 103.  Back in June, her MCV was 104. She has a normal white cell differential.   She's had normal electrolytes.  She was then referred to the [place] for an evaluation of this macrocytic index.  Eileen Holland still smokes. She smoked for about 30 years. She's not had a chest x-ray from what she can remember. She's had a little cough but no hemoptysis.  She's had no rashes. She's had no nausea or vomiting. There is no problems with her diet. She is not a vegetarian. She's had no headache. She's had no change in bowel or bladder habits.  She goes for a colonoscopy tomorrow.  She does not drink. There is no history of sickle cell the family. She does have a history of cancer in the family with her father having lung cancer and a sister having breast cancer. Eileen Holland had her last mammogram was last year.    Past Medical History  Diagnosis Date  . Hypertension   . Depression   . Anxiety   . Allergy   :  Past Surgical History  Procedure Laterality Date  . Knee arthroscopy Right 2000  . Vaginal hysterectomy  2012  :  Current outpatient prescriptions:amLODipine (NORVASC) 5 MG tablet, Take 10 mg by mouth daily. , Disp: , Rfl: ;  cyclobenzaprine (FLEXERIL) 10 MG tablet, Take 10 mg by mouth 3 (three) times daily as needed for muscle  spasms., Disp: , Rfl: ;  DOCUSATE SODIUM PO, Take 200 mg by mouth daily as needed. For stool softener , Disp: , Rfl: ;  fluticasone (VERAMYST) 27.5 MCG/SPRAY nasal spray, Place 2 sprays into the nose daily.  , Disp: , Rfl:  hydrochlorothiazide 25 MG tablet, Take 25 mg by mouth daily.  , Disp: , Rfl: ;  levocetirizine (XYZAL) 5 MG tablet, Take 5 mg by mouth daily as needed. For allergies  , Disp: , Rfl: ;  losartan (COZAAR) 50 MG tablet, Take 50 mg by mouth daily.  , Disp: , Rfl: ;  metoprolol (TOPROL-XL) 50 MG 24 hr tablet, Take 50 mg by mouth daily.  , Disp: , Rfl: ;  Multiple Vitamins-Minerals (STRESS TAB NF PO), Take 1 tablet by mouth daily.  , Disp: , Rfl:  multivitamin (THERAGRAN) per tablet, Take 1 tablet by mouth daily.  , Disp: , Rfl: ;  PARoxetine (PAXIL) 20 MG tablet, Take 20 mg by mouth every morning.  , Disp: , Rfl: ;  MOVIPREP 100 G SOLR, moviprep as directed. No substitutions, Disp: 1 kit, Rfl: 0:  :  Allergies  Allergen Reactions  . Aspirin Hives  . Ibuprofen Hives  :  Family History  Problem Relation Age of Onset  . Colon cancer Neg Hx   :  History   Social History  .  Marital Status: Single    Spouse Name: N/A    Number of Children: N/A  . Years of Education: N/A   Occupational History  . Not on file.   Social History Main Topics  . Smoking status: Current Every Day Smoker -- 0.50 packs/day for 30 years    Types: Cigarettes  . Smokeless tobacco: Never Used  . Alcohol Use: 0.6 oz/week    1 Glasses of wine per week  . Drug Use: No  . Sexual Activity: Not on file   Other Topics Concern  . Not on file   Social History Narrative  . No narrative on file  :  Pertinent items are noted in HPI.  Exam: '@IPVITALS' @  well-developed and well-nourished African American female in no obvious distress. Vital signs were temperature of 97.9. Pulse 66. Blood pressure 131/79. Weight is 164 pounds. Head and exam shows a normocephalic and atraumatic skull. There are no ocular  or oral lesions. She has no palpable cervical or supraclavicular lymph nodes. Lungs are clear. Cardiac exam regular in rhythm with no murmurs, rubs or bruits. Abdomen is soft. Has good bowel sounds. There is no fluid wave. There is no palpable liver or spleen tip. Back exam shows no tenderness over the spine, ribs or hips. Extremities shows no clubbing, cyanosis or edema. Skin exam no rashes, ecchymoses or petechia. Neurological exam shows no focal neurological deficits.    Recent Labs  05/14/14 1206  WBC 10.9*  HGB 12.7  HCT 37.1  PLT 229   No results found for this basename: NA, K, CL, CO2, GLUCOSE, BUN, CREATININE, CALCIUM,  in the last 72 hours  Blood smear review: Normochromic and normocytic red blood cells. There may be some minimal macrocystic changes. There is no nucleated red cells. There is no teardrop cells. There is no rouleau formation. She has no inclusion bodies. There is no target cells. White cells per normal in morphology and maturation. There is no hypersegmented polys. There is no immature myeloid or lymphoid forms. Platelets are adequate in number and size. Platelets are well granulated.  Pathology: None     Assessment and Plan:   Eileen Holland is a 53 year old African American female. She has minimal macrocytic changes with her red cells. I suppose it could be medication related. She's not anemic. There is nothing on her blood smear or on her physical exam that would suggest a blood disorder. She does not have pernicious anemia. There is no myelodysplasia. These would be the most likely causes of a macrocytic population of red cells. She's not hemolyzing.  Out of it more worried about her smoking and an underlying malignancy. She needs to have a chest x-ray done. We will try to order this.  I really don't see that she has to come back to see [place]. Again, there is no hematologic issue that we need to deal with. Her white cell count is up minimally, but this is reactive more  than anything else.  I spent about 45 minutes with her. I showed her her lab work. I explained to her what I thought was the issue. She looks good. She feels good. I told her that we needed to have a chest x-ray done.

## 2014-05-15 ENCOUNTER — Encounter: Payer: Self-pay | Admitting: Gastroenterology

## 2014-05-15 ENCOUNTER — Ambulatory Visit (AMBULATORY_SURGERY_CENTER): Payer: Managed Care, Other (non HMO) | Admitting: Gastroenterology

## 2014-05-15 VITALS — BP 143/90 | HR 58 | Temp 97.0°F | Resp 19 | Ht 62.0 in | Wt 163.0 lb

## 2014-05-15 DIAGNOSIS — D120 Benign neoplasm of cecum: Secondary | ICD-10-CM

## 2014-05-15 DIAGNOSIS — Z1211 Encounter for screening for malignant neoplasm of colon: Secondary | ICD-10-CM

## 2014-05-15 MED ORDER — SODIUM CHLORIDE 0.9 % IV SOLN: 500.0000 mL | INTRAVENOUS | Status: DC

## 2014-05-15 NOTE — Progress Notes (Signed)
Called to room to assist during endoscopic procedure.  Patient ID and intended procedure confirmed with present staff. Received instructions for my participation in the procedure from the performing physician.  

## 2014-05-15 NOTE — Progress Notes (Signed)
Report to PACU, RN, vss, BBS= Clear.  

## 2014-05-15 NOTE — Op Note (Signed)
[place]  [address] [place], [address]   COLONOSCOPY PROCEDURE REPORT  PATIENT: Eileen Holland, Eileen Holland  MR#: [id number] BIRTHDATE: [birth year] , 89  yrs. old GENDER: female ENDOSCOPIST: Ladene Artist, MD, [facility] REFERRED BY: Kennieth Francois, FNP PROCEDURE DATE:  05/15/2014 PROCEDURE:   Colonoscopy with biopsy First Screening Colonoscopy - Avg.  risk and is 50 yrs.  old or older Yes.  Prior Negative Screening - Now for repeat screening. N/A  History of Adenoma - Now for follow-up colonoscopy & has been > or = to 3 yrs.  N/A  Polyps Removed Today? Yes. ASA CLASS:   Class II INDICATIONS:average risk for colorectal cancer. MEDICATIONS: Monitored anesthesia care and Propofol 200 mg IV DESCRIPTION OF PROCEDURE:   After the risks benefits and alternatives of the procedure were thoroughly explained, informed consent was obtained.  The digital rectal exam revealed no abnormalities of the rectum.   The LB [id number]  endoscope was introduced through the anus and advanced to the cecum, which was identified by both the appendix and ileocecal valve. No adverse events experienced.   The quality of the prep was good, using MoviPrep  The instrument was then slowly withdrawn as the colon was fully examined.  COLON FINDINGS: A semi-pedunculated polyp measuring 35 mm x 20 mm in size with a broad base was found at the cecum. It was not amenable to a simple polypectomy and it was not removed.  Multiple biopsies were performed.   There was mild diverticulosis noted in the sigmoid colon and ascending colon.   The examination was otherwise normal.  Retroflexed views revealed internal Grade III hemorrhoids. The time to cecum=1 minutes 54 seconds.  Withdrawal time=11 minutes 10 seconds.  The scope was withdrawn and the procedure completed. COMPLICATIONS: There were no immediate complications.  ENDOSCOPIC IMPRESSION: 1.   Semi-pedunculated polyp 3.5 cm x 2 cm at the cecum;  multiple biopsies performed 2.   Mild diverticulosis in the sigmoid colon and ascending colon 3.   Grade III internal hemorrhoids  RECOMMENDATIONS: 1.  Await pathology results 2.  High fiber diet with liberal fluid intake. 3.  Out patient follow-up in 2 weeks to discuss options for polyp removal  eSigned:  Ladene Artist, MD, [facility] 05/15/2014 8:55 AM

## 2014-05-15 NOTE — Patient Instructions (Signed)
Discharge instructions given with verbal understanding. Handouts on polyps,diverticulosis and hemorrhoids. Resume previous medications. YOU HAD AN ENDOSCOPIC PROCEDURE TODAY AT THE [facility]: Refer to the procedure report that was given to you for any specific questions about what was found during the examination.  If the procedure report does not answer your questions, please call your gastroenterologist to clarify.  If you requested that your care partner not be given the details of your procedure findings, then the procedure report has been included in a sealed envelope for you to review at your convenience later.  YOU SHOULD EXPECT: Some feelings of bloating in the abdomen. Passage of more gas than usual.  Walking can help get rid of the air that was put into your GI tract during the procedure and reduce the bloating. If you had a lower endoscopy (such as a colonoscopy or flexible sigmoidoscopy) you may notice spotting of blood in your stool or on the toilet paper. If you underwent a bowel prep for your procedure, then you may not have a normal bowel movement for a few days.  DIET: Your first meal following the procedure should be a light meal and then it is ok to progress to your normal diet.  A half-sandwich or bowl of soup is an example of a good first meal.  Heavy or fried foods are harder to digest and may make you feel nauseous or bloated.  Likewise meals heavy in dairy and vegetables can cause extra gas to form and this can also increase the bloating.  Drink plenty of fluids but you should avoid alcoholic beverages for 24 hours.  ACTIVITY: Your care partner should take you home directly after the procedure.  You should plan to take it easy, moving slowly for the rest of the day.  You can resume normal activity the day after the procedure however you should NOT DRIVE or use heavy machinery for 24 hours (because of the sedation medicines used during the test).    SYMPTOMS TO REPORT  IMMEDIATELY: A gastroenterologist can be reached at any hour.  During normal business hours, 8:30 AM to 5:00 PM Monday through Friday, call [phone].  After hours and on weekends, please call the GI answering service at [phone] who will take a message and have the physician on call contact you.   Following lower endoscopy (colonoscopy or flexible sigmoidoscopy):  Excessive amounts of blood in the stool  Significant tenderness or worsening of abdominal pains  Swelling of the abdomen that is new, acute  Fever of 100F or higher  FOLLOW UP: If any biopsies were taken you will be contacted by phone or by letter within the next 1-3 weeks.  Call your gastroenterologist if you have not heard about the biopsies in 3 weeks.  Our staff will call the home number listed on your records the next business day following your procedure to check on you and address any questions or concerns that you may have at that time regarding the information given to you following your procedure. This is a courtesy call and so if there is no answer at the home number and we have not heard from you through the emergency physician on call, we will assume that you have returned to your regular daily activities without incident.  SIGNATURES/CONFIDENTIALITY: You and/or your care partner have signed paperwork which will be entered into your electronic medical record.  These signatures attest to the fact that that the information above on your After Visit Summary   has been reviewed and is understood.  Full responsibility of the confidentiality of this discharge information lies with you and/or your care-partner. 

## 2014-05-16 ENCOUNTER — Telehealth: Payer: Self-pay

## 2014-05-16 ENCOUNTER — Telehealth: Payer: Self-pay | Admitting: Hematology & Oncology

## 2014-05-16 NOTE — Telephone Encounter (Signed)
  Follow up Call-  Call back number 05/15/2014  Post procedure Call Back phone  # [phone]  Permission to leave phone message Yes     Patient questions:  Do you have a fever, pain , or abdominal swelling? No. Pain Score  0 *  Have you tolerated food without any problems? Yes.    Have you been able to return to your normal activities? Yes.    Do you have any questions about your discharge instructions: Diet   No. Medications  No. Follow up visit  No.  Do you have questions or concerns about your Care? No.  Actions: * If pain score is 4 or above: No action needed, pain <4.

## 2014-05-16 NOTE — Telephone Encounter (Signed)
Talked to [employer] at [place]. Pt aware she can walk in to [address] location, until 4 pm. Order faxed to [phone]

## 2014-05-22 ENCOUNTER — Encounter: Payer: Self-pay | Admitting: Gastroenterology

## 2014-06-02 ENCOUNTER — Telehealth: Payer: Self-pay

## 2014-06-02 ENCOUNTER — Encounter: Payer: Self-pay | Admitting: Gastroenterology

## 2014-06-02 ENCOUNTER — Telehealth: Payer: Self-pay | Admitting: *Deleted

## 2014-06-02 ENCOUNTER — Ambulatory Visit (INDEPENDENT_AMBULATORY_CARE_PROVIDER_SITE_OTHER): Payer: Managed Care, Other (non HMO) | Admitting: Gastroenterology

## 2014-06-02 VITALS — BP 140/90 | HR 87 | Ht 62.5 in | Wt 153.2 lb

## 2014-06-02 DIAGNOSIS — D120 Benign neoplasm of cecum: Secondary | ICD-10-CM

## 2014-06-02 NOTE — Telephone Encounter (Signed)
[facility] called back with appointment information. Patient informed of appointment with  Dr. Michael Boston on November 2nd 2015 at 9:30am, arrive at 9:00am.  She is aware of their location.

## 2014-06-02 NOTE — Progress Notes (Signed)
    History of Present Illness: This is a 53 year old female who returns for followup for a large cecal polyp not removed at colonoscopy earlier this month. Biopsies showed tubular adenoma. She has no gastrointestinal complaints.  Current Medications, Allergies, Past Medical History, Past Surgical History, Family History and Social History were reviewed in [employer] record.  Physical Exam: General: Well developed , well nourished, no acute distress Head: Normocephalic and atraumatic Eyes:  sclerae anicteric, EOMI Ears: Normal auditory acuity Mouth: No deformity or lesions Lungs: Clear throughout to auscultation Heart: Regular rate and rhythm; no murmurs, rubs or bruits Abdomen: Soft, non tender and non distended. No masses, hepatosplenomegaly or hernias noted. Normal Bowel sounds Musculoskeletal: Symmetrical with no gross deformities  Pulses:  Normal pulses noted Extremities: No clubbing, cyanosis, edema or deformities noted Neurological: Alert oriented x 4, grossly nonfocal Psychological:  Alert and cooperative. Normal mood and affect  Assessment and Recommendations:  1. Large (3.5 cm x 2 cm) cecal polyp not removed. Pathology was reviewed with the patient she understands the final pathology will be after the polyp has been completely removed. She understands this polyp needs to be removed. She was given options of surgical resection or EMR at a tertiary center. She prefers surgical management. Plan for a surveillance colonoscopy in 2 years, October 2017.

## 2014-06-02 NOTE — Telephone Encounter (Signed)
We received a call from [place].  This patient is scheduled with Dr. Sheliah Plane for 06-16-2014 @ 9:30 am .  Arrival time is 9:00 am .

## 2014-06-02 NOTE — Patient Instructions (Signed)
You have been scheduled for an appointment with ______________ at [facility]. Your appointment is on __________ at __________. Please arrive at ___________ for registration. Make certain to bring a list of current medications, including any over the counter medications or vitamins. Also bring your co-pay if you have one as well as your insurance cards. [facility] is located at [address]. Should you need to reschedule your appointment, please contact them at [phone].  We will put a colon recall in the system for 05/2016 and you will be notified then.   I appreciate the opportunity to care for you.

## 2014-06-16 ENCOUNTER — Other Ambulatory Visit (INDEPENDENT_AMBULATORY_CARE_PROVIDER_SITE_OTHER): Payer: Self-pay | Admitting: Surgery

## 2014-06-16 NOTE — H&P (Signed)
Eileen Holland 06/16/2014 9:53 AM Location: [facility] Patient #: [id number] DOB: November 21, 1960 Single / Language: Eileen Holland / Race: Black or African American Female  History of Present Illness Eileen Hector MD; 06/16/2014 11:07 AM) Patient words: Cecal polyp removal.  The patient is a 53 year old female who presents with a colonic polyp. Patient sent to me from Dr. Lucio Edward with [facility] [place] woman underwent screening colonoscopy. Recalls father having polyps found in his 27s. One brother with Crohn's disease. Otherwise no history of other inflammatory bowel or gastroenterology issues. She has mild constipation. Improved with stool softener. Tends to have one bowel movement a day. Mildly irritating hemorrhoid but usually well controlled when bowels regular. History of robotic partial hysterectomy but no other abdominal surgeries. On colonoscopy last month, she was found to have a large polyp in the proximal colon. 3.5 cm at cecum. Biopsy shows adenomatous polyp. Felt to be too large to remove by endoscopy. Patient did not want to consider endoscopic resection at tertiary referral center and referred to consider surgery to remove the polyp. Therefore consultation requested. No personal nor family history of GI/colon cancer, UC, irritable bowel syndrome, allergy such as Celiac Sprue, dietary/dairy problems, colitis, ulcers nor gastritis. No recent sick contacts/gastroenteritis. No travel outside the country. No changes in diet. No dysphagia to solids or liquids. No significant heartburn or reflux. No hematochezia, hematemesis, coffee ground emesis. No evidence of prior gastric/peptic ulceration. She works in the lab. Can walk a half hour without difficulty. No history of cardiopulmonary issues.   Other Problems Eileen Holland, CMA; 06/16/2014 9:53 AM) Depression Hemorrhoids High blood pressure Migraine Headache  Past Surgical History ([place];  06/16/2014 9:53 AM) Hysterectomy (not due to cancer) - Partial Knee Surgery Right. Oral Surgery  Diagnostic Studies History Eileen Holland, CMA; 06/16/2014 9:53 AM) Colonoscopy within last year Mammogram 1-3 years ago Pap Smear 1-5 years ago  Allergies Eileen Holland, CMA; 06/16/2014 9:55 AM) Aspirin *ANALGESICS - NonNarcotic* Ibuprofen *ANALGESICS - ANTI-INFLAMMATORY*  Medication History (Eileen Holland, CMA; 06/16/2014 9:55 AM) AmLODIPine Besylate (5MG  Tablet, Oral daily) Active. Cyclobenzaprine HCl (10MG  Tablet, Oral) Active. Hydrochlorothiazide (25MG  Tablet, Oral daily) Active. Fluticasone Propionate (50MCG/ACT Suspension, Nasal as needed) Active. Levocetirizine Dihydrochloride (5MG  Tablet, Oral daily) Active. Metoprolol Succinate ER (100MG  Tablet ER 24HR, Oral daily) Active. Losartan Potassium (100MG  Tablet, Oral daily) Active. PARoxetine HCl (20MG  Tablet, Oral daily) Active. Topiramate (50MG  Tablet, Oral daily) Active.  Social History Eileen Holland, [place]; 06/16/2014 9:53 AM) Alcohol use Occasional alcohol use. Caffeine use Carbonated beverages, Coffee, Tea. Illicit drug use Remotely quit drug use. Tobacco use Current every day smoker.  Family History Eileen Holland, CMA; 06/16/2014 9:53 AM) Alcohol Abuse Brother, Father. Arthritis Mother. Breast Cancer Sister. Colon Polyps Father. Depression Daughter, Sister. Diabetes Mellitus Brother, Mother. Hypertension Brother, Father. Migraine Headache Daughter, Sister.  Pregnancy / Birth History Eileen Holland, [place]; 06/16/2014 9:53 AM) Age at menarche 42 years. Contraceptive History Depo-provera, Oral contraceptives. Gravida 1 Maternal age 62-20 Para 1  Review of Systems ([place]; 06/16/2014 9:53 AM) Skin Not Present- Change in Wart/Mole, Dryness, Hives, Jaundice, New Lesions, Non-Healing Wounds, Rash and Ulcer. HEENT Present- Seasonal Allergies and Wears glasses/contact lenses. Not Present- Earache,  Hearing Loss, Hoarseness, Nose Bleed, Oral Ulcers, Ringing in the Ears, Sinus Pain, Sore Throat, Visual Disturbances and Yellow Eyes. Respiratory Present- Snoring. Not Present- Bloody sputum, Chronic Cough, Difficulty Breathing and Wheezing. Breast Not Present- Breast Mass, Breast Pain, Nipple Discharge and Skin Changes. Cardiovascular Not Present- Chest  Pain, Difficulty Breathing Lying Down, Leg Cramps, Palpitations, Rapid Heart Rate, Shortness of Breath and Swelling of Extremities. Gastrointestinal Present- Hemorrhoids. Not Present- Abdominal Pain, Bloating, Bloody Stool, Change in Bowel Habits, Chronic diarrhea, Constipation, Difficulty Swallowing, Excessive gas, Gets full quickly at meals, Indigestion, Nausea, Rectal Pain and Vomiting. Female Genitourinary Not Present- Frequency, Nocturia, Painful Urination, Pelvic Pain and Urgency. Musculoskeletal Not Present- Back Pain, Joint Pain, Joint Stiffness, Muscle Pain, Muscle Weakness and Swelling of Extremities. Neurological Not Present- Decreased Memory, Fainting, Headaches, Numbness, Seizures, Tingling, Tremor, Trouble walking and Weakness. Psychiatric Not Present- Anxiety, Bipolar, Change in Sleep Pattern, Depression, Fearful and Frequent crying. Endocrine Not Present- Cold Intolerance, Excessive Hunger, Hair Changes, Heat Intolerance, Hot flashes and New Diabetes. Hematology Not Present- Easy Bruising, Excessive bleeding, Gland problems, HIV and Persistent Infections.   Vitals (Eileen Holland CMA; 06/16/2014 9:56 AM) 06/16/2014 9:55 AM Weight: 164 lb Height: 62in Body Surface Area: 1.8 m Body Mass Index: 30 kg/m Temp.: 31F(Temporal)  Pulse: 75 (Regular)  BP: 128/82 (Sitting, Left Arm, Standard)    Physical Exam Eileen Hector MD; 06/16/2014 10:42 AM) General Mental Status-Alert. General Appearance-Not in acute distress, Not Sickly. Orientation-Oriented X3. Hydration-Well  hydrated. Voice-Normal.  Integumentary Global Assessment Upon inspection and palpation of skin surfaces of the - Axillae: non-tender, no inflammation or ulceration, no drainage. and Distribution of scalp and body hair is normal. General Characteristics Temperature - normal warmth is noted.  Head and Neck Head-normocephalic, atraumatic with no lesions or palpable masses. Face Global Assessment - atraumatic, no absence of expression. Neck Global Assessment - no abnormal movements, no bruit auscultated on the right, no bruit auscultated on the left, no decreased range of motion, non-tender. Trachea-midline. Thyroid Gland Characteristics - non-tender.  Eye Eyeball - Left-Extraocular movements intact, No Nystagmus. Eyeball - Right-Extraocular movements intact, No Nystagmus. Cornea - Left-No Hazy. Cornea - Right-No Hazy. Sclera/Conjunctiva - Left-No scleral icterus, No Discharge. Sclera/Conjunctiva - Right-No scleral icterus, No Discharge. Pupil - Left-Direct reaction to light normal. Pupil - Right-Direct reaction to light normal.  ENMT Ears Pinna - Left - no drainage observed, no generalized tenderness observed. Right - no drainage observed, no generalized tenderness observed. Nose and Sinuses External Inspection of the Nose - no destructive lesion observed. Inspection of the nares - Left - quiet respiration. Right - quiet respiration. Mouth and Throat Lips - Upper Lip - no fissures observed, no pallor noted. Lower Lip - no fissures observed, no pallor noted. Nasopharynx - no discharge present. Oral Cavity/Oropharynx - Tongue - no dryness observed. Oral Mucosa - no cyanosis observed. Hypopharynx - no evidence of airway distress observed.  Chest and Lung Exam Inspection Movements - Normal and Symmetrical. Accessory muscles - No use of accessory muscles in breathing. Palpation Palpation of the chest reveals - Non-tender. Auscultation Breath sounds - Normal  and Clear.  Cardiovascular Auscultation Rhythm - Regular. Murmurs & Other Heart Sounds - Auscultation of the heart reveals - No Murmurs and No Systolic Clicks.  Abdomen Inspection Inspection of the abdomen reveals - No Visible peristalsis and No Abnormal pulsations. Umbilicus - No Bleeding, No Urine drainage. Palpation/Percussion Palpation and Percussion of the abdomen reveal - Soft, Non Tender, No Rebound tenderness, No Rigidity (guarding) and No Cutaneous hyperesthesia. Note: Robotic port incisions well healed   Female Genitourinary Sexual Maturity Tanner 5 - Adult hair pattern. Note: No vaginal bleeding nor discharge   Peripheral Vascular Upper Extremity Inspection - Left - No Cyanotic nailbeds, Not Ischemic. Right - No Cyanotic nailbeds, Not Ischemic.  Neurologic Neurologic evaluation reveals -normal attention span and ability to concentrate, able to name objects and repeat phrases. Appropriate fund of knowledge , normal sensation and normal coordination. Mental Status Affect - not angry, not paranoid. Cranial Nerves-Normal Bilaterally. Gait-Normal.  Neuropsychiatric Mental status exam performed with findings of-able to articulate well with normal speech/language, rate, volume and coherence, thought content normal with ability to perform basic computations and apply abstract reasoning and no evidence of hallucinations, delusions, obsessions or homicidal/suicidal ideation.  Musculoskeletal Global Assessment Spine, Ribs and Pelvis - no instability, subluxation or laxity. Right Upper Extremity - no instability, subluxation or laxity.  Lymphatic Head & Neck  General Head & Neck Lymphatics: Bilateral - Description - No Localized lymphadenopathy. Axillary  General Axillary Region: Bilateral - Description - No Localized lymphadenopathy. Femoral & Inguinal  Generalized Femoral & Inguinal Lymphatics: Left - Description - No Localized lymphadenopathy. Right -  Description - No Localized lymphadenopathy.    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 It too large to safely remove by polypectomy only. Patient declines quaternary referral for possible endoscopic mucosal resection. Therefore recommend segmental colon resection. Good candidate for a MIS robotic approach with in-line "isoperistaltic" intracorporeal anastomosis. d/w the patient. She agrees to proceed Current Plans  Schedule for Surgery The anatomy & physiology of the digestive tract was discussed. The pathophysiology of the colon was discussed. Natural history risks without surgery was discussed. I feel the risks of no intervention will lead to serious problems that outweigh the operative risks; therefore, I recommended a partial colectomy to remove the pathology. Minimally invasive (Robotic/Laparoscopic) & open techniques were discussed.  Risks such as bleeding, infection, abscess, leak, reoperation, possible ostomy, hernia, heart attack, death, and other risks were discussed. I noted a good likelihood this will help address the problem. Goals of post-operative recovery were discussed as well. Need for adequate nutrition, daily bowel regimen and healthy physical activity, to optimize recovery was noted as well. We will work to minimize complications. Educational materials were available as well. Questions were answered. The patient expresses understanding & wishes to proceed with surgery. Pt Education - [facility] Bowel Prep Pt Education - [facility] Good Bowel Health (Niquan Charnley) Pt Education - [facility] Abdominal Surgery (Rajanee Schuelke) Started Neomycin Sulfate 500MG , 2 (two) Tablet SEE NOTE, #6, 06/16/2014, No Refill. Local Order: TAKE TWO TABLETS AT 2 PM, 3 PM, AND 10 PM THE DAY PRIOR TO SURGERY Started Flagyl 500MG , 2 (two) Tablet SEE NOTE, #6, 06/16/2014, No Refill. Local Order: Take at 2pm,  3pm, and 10pm the day prior to your colon operation TOBACCO ABUSE (305.1  Z72.0) Impression: QUIT SMOKING! Current Plans Free Text Instructions : discussed with patient and provided information.   Signed by Eileen Hector, MD (06/16/2014 11:09 AM

## 2014-06-26 ENCOUNTER — Telehealth: Payer: Self-pay | Admitting: Gastroenterology

## 2014-06-26 NOTE — Telephone Encounter (Signed)
Patient did see [facility] today, but she is not in network and has to pay upfront to have the surgery with them.  Patient advised that we would need to know who she needs to go to, because we are not familiar with [facility].  She is not sure of any herself.  She asked that I forward all the notes on to her primary care, since they are a [facility] facility she will ask her recommendation for a surgeon.  She understands to call me back if she needs [place] to help with the referral to another location if her primary care can't do the referral.  .

## 2014-07-04 DIAGNOSIS — G43909 Migraine, unspecified, not intractable, without status migrainosus: Secondary | ICD-10-CM | POA: Insufficient documentation

## 2014-07-04 HISTORY — DX: Migraine, unspecified, not intractable, without status migrainosus: G43.909

## 2014-07-14 ENCOUNTER — Encounter (HOSPITAL_COMMUNITY): Payer: Self-pay

## 2014-07-14 ENCOUNTER — Encounter (HOSPITAL_COMMUNITY)
Admission: RE | Admit: 2014-07-14 | Discharge: 2014-07-14 | Disposition: A | Discharge status: Home / Self Care | Payer: Managed Care, Other (non HMO) | Source: Ambulatory Visit | Attending: Surgery | Admitting: Surgery

## 2014-07-14 ENCOUNTER — Ambulatory Visit (HOSPITAL_COMMUNITY)
Admission: RE | Admit: 2014-07-14 | Discharge: 2014-07-14 | Disposition: A | Discharge status: Home / Self Care | Payer: Managed Care, Other (non HMO) | Source: Ambulatory Visit | Attending: Anesthesiology | Admitting: Anesthesiology

## 2014-07-14 DIAGNOSIS — I10 Essential (primary) hypertension: Secondary | ICD-10-CM | POA: Diagnosis not present

## 2014-07-14 DIAGNOSIS — Z01818 Encounter for other preprocedural examination: Secondary | ICD-10-CM | POA: Insufficient documentation

## 2014-07-14 DIAGNOSIS — F17210 Nicotine dependence, cigarettes, uncomplicated: Secondary | ICD-10-CM | POA: Diagnosis not present

## 2014-07-14 DIAGNOSIS — I517 Cardiomegaly: Secondary | ICD-10-CM | POA: Diagnosis not present

## 2014-07-14 HISTORY — DX: Headache: R51

## 2014-07-14 HISTORY — DX: Headache, unspecified: R51.9

## 2014-07-14 NOTE — Progress Notes (Signed)
07-14-14 1600 Copy of waveform EKG(07-04-14) from [place] and placed with chart.

## 2014-07-14 NOTE — Progress Notes (Signed)
Received EKG interpretation results from PCP visit from 07/04/2014 on chart; awaiting tracing to be faxed.

## 2014-07-14 NOTE — Progress Notes (Signed)
Your patient has screened at an elevated risk for Obstructive Sleep Apnea using the Stop-Bang Tool during a pre-surgical vist. A score of 4 or greater is an elevated risk. Score of 4. 

## 2014-07-14 NOTE — Patient Instructions (Signed)
[place]  07/14/2014   Your procedure is scheduled on:       Thursday July 17, 2014  Report to [facility] Main Entrance and follow signs to  [place] at 1000 AM.  Call this number if you have problems the morning of surgery [phone] or Presurgical Testing [phone].   Remember:  Do not eat food or drink liquids :After Midnight.      Take these medicines the morning of surgery with A SIP OF WATER: Alprazolam (Xanax) if needed;Metoprolol;Paxil                               You may not have any metal on your body including hair pins and piercings  Do not wear jewelry, make-up, lotions, powders, or deodorant.  Do not shave body hair  48 hours(2 days) of CHG soap use.                Do not bring valuables to the hospital. [place].  Contacts, dentures or bridgework may not be worn into surgery.  Leave suitcase in the car. After surgery it may be brought to your room.  For patients admitted to the hospital, checkout time is 11:00 AM the day of discharge.      Special Instructions: review fact sheets for Blood Transfusion fact sheet Remember: Type/Screen "Blue armsbands"- may not be removed once applied(would result in being retested AM of surgery, if removed). ________________________________________________________________________  [facility] - Preparing for Surgery Before surgery, you can play an important role.  Because skin is not sterile, your skin needs to be as free of germs as possible.  You can reduce the number of germs on your skin by washing with CHG (chlorahexidine gluconate) soap before surgery.  CHG is an antiseptic cleaner which kills germs and bonds with the skin to continue killing germs even after washing. Please DO NOT use if you have an allergy to CHG or antibacterial soaps.  If your skin becomes reddened/irritated stop using the CHG and inform your nurse when you arrive at Short Stay. Do  not shave (including legs and underarms) for at least 48 hours prior to the first CHG shower.  You may shave your face/neck. Please follow these instructions carefully:  1.  Shower with CHG Soap the night before surgery and the  morning of Surgery.  2.  If you choose to wash your hair, wash your hair first as usual with your  normal  shampoo.  3.  After you shampoo, rinse your hair and body thoroughly to remove the  shampoo.                           4.  Use CHG as you would any other liquid soap.  You can apply chg directly  to the skin and wash                       Gently with a scrungie or clean washcloth.  5.  Apply the CHG Soap to your body ONLY FROM THE NECK DOWN.   Do not use on face/ open                           Wound or open sores. Avoid contact with eyes, ears mouth and genitals (  private parts).                       Wash face,  Genitals (private parts) with your normal soap.             6.  Wash thoroughly, paying special attention to the area where your surgery  will be performed.  7.  Thoroughly rinse your body with warm water from the neck down.  8.  DO NOT shower/wash with your normal soap after using and rinsing off  the CHG Soap.                9.  Pat yourself dry with a clean towel.            10.  Wear clean pajamas.            11.  Place clean sheets on your bed the night of your first shower and do not  sleep with pets. Day of Surgery : Do not apply any lotions/deodorants the morning of surgery.  Please wear clean clothes to the hospital/surgery center.  FAILURE TO FOLLOW THESE INSTRUCTIONS MAY RESULT IN THE CANCELLATION OF YOUR SURGERY PATIENT SIGNATURE_________________________________  NURSE SIGNATURE__________________________________  ________________________________________________________________________

## 2014-07-14 NOTE — Progress Notes (Signed)
CBC and CMP results from 07/04/2014 per chart

## 2014-07-15 LAB — HEMOGLOBIN A1C
HEMOGLOBIN A1C: 5.1 % (ref <5.7)
Mean Plasma Glucose: 100 mg/dL (ref <117)

## 2014-07-16 NOTE — Anesthesia Preprocedure Evaluation (Addendum)
Anesthesia Evaluation  Patient identified by MRN, date of birth, ID band Patient awake    Reviewed: Allergy & Precautions, H&P , NPO status , Patient's Chart, lab work & pertinent test results  Airway Mallampati: III  TM Distance: >3 FB Neck ROM: Full    Dental no notable dental hx. (+) Dental Advisory Given   Pulmonary Current Smoker,  breath sounds clear to auscultation  Pulmonary exam normal       Cardiovascular Exercise Tolerance: Good hypertension, Pt. on medications Rhythm:Regular Rate:Normal     Neuro/Psych  Headaches, PSYCHIATRIC DISORDERS Anxiety Depression    GI/Hepatic Neg liver ROS, Tubular adenoma    Endo/Other  negative endocrine ROS  Renal/GU negative Renal ROS  negative genitourinary   Musculoskeletal negative musculoskeletal ROS (+)   Abdominal   Peds negative pediatric ROS (+)  Hematology negative hematology ROS (+)   Anesthesia Other Findings   Reproductive/Obstetrics negative OB ROS                            Anesthesia Physical Anesthesia Plan  ASA: II  Anesthesia Plan: General   Post-op Pain Management:    Induction: Intravenous  Airway Management Planned: Oral ETT  Additional Equipment:   Intra-op Plan:   Post-operative Plan: Extubation in OR  Informed Consent: I have reviewed the patients History and Physical, chart, labs and discussed the procedure including the risks, benefits and alternatives for the proposed anesthesia with the patient or authorized representative who has indicated his/her understanding and acceptance.   Dental advisory given  Plan Discussed with: CRNA  Anesthesia Plan Comments:         Anesthesia Quick Evaluation

## 2014-07-17 ENCOUNTER — Encounter (HOSPITAL_COMMUNITY)
Admission: RE | Disposition: A | Discharge status: Home / Self Care | Payer: Self-pay | Source: Ambulatory Visit | Attending: Surgery

## 2014-07-17 ENCOUNTER — Inpatient Hospital Stay (HOSPITAL_COMMUNITY): Payer: Managed Care, Other (non HMO) | Admitting: Anesthesiology

## 2014-07-17 ENCOUNTER — Encounter (HOSPITAL_COMMUNITY): Payer: Self-pay | Admitting: *Deleted

## 2014-07-17 ENCOUNTER — Inpatient Hospital Stay (HOSPITAL_COMMUNITY)
Admission: RE | Admit: 2014-07-17 | Discharge: 2014-07-21 | DRG: 331 | Disposition: A | Discharge status: Home / Self Care | Payer: Managed Care, Other (non HMO) | Source: Ambulatory Visit | Attending: Surgery | Admitting: Surgery

## 2014-07-17 DIAGNOSIS — Z79899 Other long term (current) drug therapy: Secondary | ICD-10-CM

## 2014-07-17 DIAGNOSIS — J029 Acute pharyngitis, unspecified: Secondary | ICD-10-CM | POA: Diagnosis not present

## 2014-07-17 DIAGNOSIS — K5900 Constipation, unspecified: Secondary | ICD-10-CM | POA: Diagnosis present

## 2014-07-17 DIAGNOSIS — F329 Major depressive disorder, single episode, unspecified: Secondary | ICD-10-CM | POA: Diagnosis present

## 2014-07-17 DIAGNOSIS — G43909 Migraine, unspecified, not intractable, without status migrainosus: Secondary | ICD-10-CM | POA: Diagnosis present

## 2014-07-17 DIAGNOSIS — I10 Essential (primary) hypertension: Secondary | ICD-10-CM | POA: Diagnosis present

## 2014-07-17 DIAGNOSIS — Z833 Family history of diabetes mellitus: Secondary | ICD-10-CM | POA: Diagnosis not present

## 2014-07-17 DIAGNOSIS — F17210 Nicotine dependence, cigarettes, uncomplicated: Secondary | ICD-10-CM | POA: Diagnosis present

## 2014-07-17 DIAGNOSIS — D120 Benign neoplasm of cecum: Secondary | ICD-10-CM | POA: Diagnosis present

## 2014-07-17 DIAGNOSIS — K649 Unspecified hemorrhoids: Secondary | ICD-10-CM | POA: Diagnosis present

## 2014-07-17 DIAGNOSIS — Z803 Family history of malignant neoplasm of breast: Secondary | ICD-10-CM | POA: Diagnosis not present

## 2014-07-17 DIAGNOSIS — D126 Benign neoplasm of colon, unspecified: Secondary | ICD-10-CM | POA: Diagnosis present

## 2014-07-17 DIAGNOSIS — K635 Polyp of colon: Secondary | ICD-10-CM | POA: Diagnosis present

## 2014-07-17 DIAGNOSIS — Z8371 Family history of colonic polyps: Secondary | ICD-10-CM

## 2014-07-17 DIAGNOSIS — Z720 Tobacco use: Secondary | ICD-10-CM | POA: Diagnosis present

## 2014-07-17 DIAGNOSIS — Z90711 Acquired absence of uterus with remaining cervical stump: Secondary | ICD-10-CM | POA: Diagnosis present

## 2014-07-17 DIAGNOSIS — Z8249 Family history of ischemic heart disease and other diseases of the circulatory system: Secondary | ICD-10-CM | POA: Diagnosis not present

## 2014-07-17 HISTORY — PX: COLON SURGERY: SHX602

## 2014-07-17 HISTORY — DX: Migraine, unspecified, not intractable, without status migrainosus: G43.909

## 2014-07-17 HISTORY — PX: POLYPECTOMY: SHX149

## 2014-07-17 LAB — CREATININE, SERUM
Creatinine, Ser: 0.98 mg/dL (ref 0.50–1.10)
GFR calc Af Amer: 76 mL/min — ABNORMAL LOW (ref >90)
GFR calc non Af Amer: 65 mL/min — ABNORMAL LOW (ref >90)

## 2014-07-17 LAB — TYPE AND SCREEN
ABO/RH(D): O POS
ANTIBODY SCREEN: NEGATIVE

## 2014-07-17 LAB — CBC
HCT: 34.8 % — ABNORMAL LOW (ref 36.0–46.0)
Hemoglobin: 11.9 g/dL — ABNORMAL LOW (ref 12.0–15.0)
MCH: 34.9 pg — AB (ref 26.0–34.0)
MCHC: 34.2 g/dL (ref 30.0–36.0)
MCV: 102.1 fL — ABNORMAL HIGH (ref 78.0–100.0)
Platelets: 235 10*3/uL (ref 150–400)
RBC: 3.41 MIL/uL — AB (ref 3.87–5.11)
RDW: 11.8 % (ref 11.5–15.5)
WBC: 18 10*3/uL — ABNORMAL HIGH (ref 4.0–10.5)

## 2014-07-17 LAB — ABO/RH: ABO/RH(D): O POS

## 2014-07-17 SURGERY — COLECTOMY, PARTIAL, ROBOT-ASSISTED, LAPAROSCOPIC
Anesthesia: General

## 2014-07-17 MED ORDER — HYDROMORPHONE HCL 1 MG/ML IJ SOLN
INTRAMUSCULAR | Status: DC | PRN
  Administered 2014-07-17: .4 mg via INTRAVENOUS

## 2014-07-17 MED ORDER — SODIUM CHLORIDE 0.9 % IV SOLN
INTRAVENOUS | Status: AC
  Administered 2014-07-17: 14:00:00 via INTRAPERITONEAL
  Filled 2014-07-17: qty 6

## 2014-07-17 MED ORDER — PHENOL 1.4 % MT LIQD: 2.0000 | OROMUCOSAL | Status: DC | PRN

## 2014-07-17 MED ORDER — ATROPINE SULFATE 0.4 MG/ML IJ SOLN
INTRAMUSCULAR | Status: AC
  Filled 2014-07-17: qty 1

## 2014-07-17 MED ORDER — KETOCONAZOLE 2 % EX CREA: 1.0000 "application " | TOPICAL_CREAM | Freq: Every day | CUTANEOUS | Status: DC | PRN

## 2014-07-17 MED ORDER — SODIUM CHLORIDE 0.9 % IV SOLN
INTRAVENOUS | Status: DC
  Administered 2014-07-17: 1000 mL via INTRAVENOUS
  Administered 2014-07-18 – 2014-07-19 (×2): via INTRAVENOUS

## 2014-07-17 MED ORDER — ACETAMINOPHEN 10 MG/ML IV SOLN
INTRAVENOUS | Status: DC | PRN
  Administered 2014-07-17: 1000 mg via INTRAVENOUS

## 2014-07-17 MED ORDER — CHLORHEXIDINE GLUCONATE 4 % EX LIQD: 60.0000 mL | Freq: Once | CUTANEOUS | Status: DC

## 2014-07-17 MED ORDER — ONDANSETRON HCL 4 MG/2ML IJ SOLN
INTRAMUSCULAR | Status: DC | PRN
  Administered 2014-07-17 (×2): 2 mg via INTRAVENOUS

## 2014-07-17 MED ORDER — SUFENTANIL CITRATE 50 MCG/ML IV SOLN
INTRAVENOUS | Status: DC | PRN
  Administered 2014-07-17: 10 ug via INTRAVENOUS
  Administered 2014-07-17: 5 ug via INTRAVENOUS
  Administered 2014-07-17: 15 ug via INTRAVENOUS
  Administered 2014-07-17 (×2): 10 ug via INTRAVENOUS

## 2014-07-17 MED ORDER — ACETAMINOPHEN 500 MG PO TABS
1000.0000 mg | ORAL_TABLET | Freq: Three times a day (TID) | ORAL | Status: DC
  Administered 2014-07-17 – 2014-07-18 (×2): 1000 mg via ORAL
  Filled 2014-07-17 (×5): qty 2

## 2014-07-17 MED ORDER — FLUTICASONE PROPIONATE 50 MCG/ACT NA SUSP
1.0000 | Freq: Every day | NASAL | Status: DC
  Administered 2014-07-21: 1 via NASAL
  Filled 2014-07-17: qty 16

## 2014-07-17 MED ORDER — PHENYLEPHRINE HCL 10 MG/ML IJ SOLN
INTRAMUSCULAR | Status: DC | PRN
  Administered 2014-07-17 (×3): 20 ug via INTRAVENOUS

## 2014-07-17 MED ORDER — ALVIMOPAN 12 MG PO CAPS
12.0000 mg | ORAL_CAPSULE | Freq: Once | ORAL | Status: AC
  Administered 2014-07-17: 12 mg via ORAL
  Filled 2014-07-17: qty 1

## 2014-07-17 MED ORDER — PROMETHAZINE HCL 25 MG/ML IJ SOLN: 6.2500 mg | INTRAMUSCULAR | Status: DC | PRN

## 2014-07-17 MED ORDER — BUPIVACAINE-EPINEPHRINE 0.25% -1:200000 IJ SOLN
INTRAMUSCULAR | Status: DC | PRN
  Administered 2014-07-17: 80 mL

## 2014-07-17 MED ORDER — LACTATED RINGERS IV SOLN
INTRAVENOUS | Status: DC
  Administered 2014-07-17: 13:00:00 via INTRAVENOUS
  Administered 2014-07-17 (×2): 1000 mL via INTRAVENOUS
  Administered 2014-07-17: 15:00:00 via INTRAVENOUS

## 2014-07-17 MED ORDER — CEFOTETAN DISODIUM-DEXTROSE 2-2.08 GM-% IV SOLR
INTRAVENOUS | Status: AC
  Filled 2014-07-17: qty 50

## 2014-07-17 MED ORDER — GLYCOPYRROLATE 0.2 MG/ML IJ SOLN
INTRAMUSCULAR | Status: DC | PRN
  Administered 2014-07-17: 0.6 mg via INTRAVENOUS

## 2014-07-17 MED ORDER — LIP MEDEX EX OINT
1.0000 "application " | TOPICAL_OINTMENT | Freq: Two times a day (BID) | CUTANEOUS | Status: DC
  Administered 2014-07-17 – 2014-07-21 (×3): 1 via TOPICAL
  Filled 2014-07-17: qty 7

## 2014-07-17 MED ORDER — GLYCOPYRROLATE 0.2 MG/ML IJ SOLN
INTRAMUSCULAR | Status: AC
  Filled 2014-07-17: qty 3

## 2014-07-17 MED ORDER — DIPHENHYDRAMINE HCL 12.5 MG/5ML PO ELIX: 12.5000 mg | ORAL_SOLUTION | Freq: Four times a day (QID) | ORAL | Status: DC | PRN

## 2014-07-17 MED ORDER — LACTATED RINGERS IV BOLUS (SEPSIS): 1000.0000 mL | Freq: Three times a day (TID) | INTRAVENOUS | Status: AC | PRN

## 2014-07-17 MED ORDER — ONDANSETRON HCL 4 MG/2ML IJ SOLN
INTRAMUSCULAR | Status: AC
  Filled 2014-07-17: qty 2

## 2014-07-17 MED ORDER — CISATRACURIUM BESYLATE 20 MG/10ML IV SOLN
INTRAVENOUS | Status: AC
  Filled 2014-07-17: qty 10

## 2014-07-17 MED ORDER — FENTANYL CITRATE 0.05 MG/ML IJ SOLN
25.0000 ug | INTRAMUSCULAR | Status: DC | PRN
  Administered 2014-07-17: 25 ug via INTRAVENOUS

## 2014-07-17 MED ORDER — SODIUM CHLORIDE 0.9 % IJ SOLN
INTRAMUSCULAR | Status: AC
  Filled 2014-07-17: qty 10

## 2014-07-17 MED ORDER — BUPIVACAINE-EPINEPHRINE 0.25% -1:200000 IJ SOLN
INTRAMUSCULAR | Status: AC
  Filled 2014-07-17: qty 1

## 2014-07-17 MED ORDER — MENTHOL 3 MG MT LOZG: 1.0000 | LOZENGE | OROMUCOSAL | Status: DC | PRN

## 2014-07-17 MED ORDER — CISATRACURIUM BESYLATE (PF) 10 MG/5ML IV SOLN
INTRAVENOUS | Status: DC | PRN
  Administered 2014-07-17: 12 mg via INTRAVENOUS
  Administered 2014-07-17: 8 mg via INTRAVENOUS
  Administered 2014-07-17: 2 mg via INTRAVENOUS
  Administered 2014-07-17: 4 mg via INTRAVENOUS

## 2014-07-17 MED ORDER — ONDANSETRON HCL 4 MG PO TABS: 4.0000 mg | ORAL_TABLET | Freq: Four times a day (QID) | ORAL | Status: DC | PRN

## 2014-07-17 MED ORDER — EPHEDRINE SULFATE 50 MG/ML IJ SOLN
INTRAMUSCULAR | Status: AC
  Filled 2014-07-17: qty 1

## 2014-07-17 MED ORDER — DEXTROSE 5 % IV SOLN
2.0000 g | INTRAVENOUS | Status: AC
  Administered 2014-07-17: 2 g via INTRAVENOUS
  Filled 2014-07-17: qty 2

## 2014-07-17 MED ORDER — DEXTROSE 5 % IV SOLN
2.0000 g | Freq: Two times a day (BID) | INTRAVENOUS | Status: AC
  Administered 2014-07-18: 2 g via INTRAVENOUS
  Filled 2014-07-17: qty 2

## 2014-07-17 MED ORDER — ALBUMIN HUMAN 5 % IV SOLN
INTRAVENOUS | Status: AC
  Filled 2014-07-17: qty 250

## 2014-07-17 MED ORDER — MIDAZOLAM HCL 5 MG/5ML IJ SOLN
INTRAMUSCULAR | Status: DC | PRN
  Administered 2014-07-17: 2 mg via INTRAVENOUS

## 2014-07-17 MED ORDER — PROPOFOL 10 MG/ML IV BOLUS
INTRAVENOUS | Status: DC | PRN
  Administered 2014-07-17: 150 mg via INTRAVENOUS

## 2014-07-17 MED ORDER — HYDROMORPHONE HCL 1 MG/ML IJ SOLN: INTRAMUSCULAR | Status: DC | PRN

## 2014-07-17 MED ORDER — OXYCODONE HCL 5 MG PO TABS: 5.0000 mg | ORAL_TABLET | ORAL | Status: DC | PRN

## 2014-07-17 MED ORDER — LIDOCAINE HCL (CARDIAC) 20 MG/ML IV SOLN
INTRAVENOUS | Status: DC | PRN
  Administered 2014-07-17: 100 mg via INTRAVENOUS

## 2014-07-17 MED ORDER — ALBUMIN HUMAN 5 % IV SOLN
12.5000 g | Freq: Once | INTRAVENOUS | Status: AC
  Administered 2014-07-17: 12.5 g via INTRAVENOUS

## 2014-07-17 MED ORDER — DIPHENHYDRAMINE HCL 50 MG/ML IJ SOLN: 12.5000 mg | Freq: Four times a day (QID) | INTRAMUSCULAR | Status: DC | PRN

## 2014-07-17 MED ORDER — BUPIVACAINE-EPINEPHRINE (PF) 0.25% -1:200000 IJ SOLN
INTRAMUSCULAR | Status: AC
  Filled 2014-07-17: qty 30

## 2014-07-17 MED ORDER — LACTATED RINGERS IR SOLN
Status: DC | PRN
Start: 2014-07-17 — End: 2014-07-17
  Administered 2014-07-17: 1000 mL

## 2014-07-17 MED ORDER — HEPARIN SODIUM (PORCINE) 5000 UNIT/ML IJ SOLN
5000.0000 [IU] | Freq: Three times a day (TID) | INTRAMUSCULAR | Status: DC
  Administered 2014-07-18 – 2014-07-20 (×9): 5000 [IU] via SUBCUTANEOUS
  Filled 2014-07-17 (×13): qty 1

## 2014-07-17 MED ORDER — DEXAMETHASONE SODIUM PHOSPHATE 10 MG/ML IJ SOLN
INTRAMUSCULAR | Status: AC
  Filled 2014-07-17: qty 1

## 2014-07-17 MED ORDER — EPHEDRINE SULFATE 50 MG/ML IJ SOLN
INTRAMUSCULAR | Status: DC | PRN
  Administered 2014-07-17 (×2): 10 mg via INTRAVENOUS

## 2014-07-17 MED ORDER — HYDROMORPHONE HCL 2 MG/ML IJ SOLN
INTRAMUSCULAR | Status: AC
  Filled 2014-07-17: qty 1

## 2014-07-17 MED ORDER — SUFENTANIL CITRATE 50 MCG/ML IV SOLN
INTRAVENOUS | Status: AC
  Filled 2014-07-17: qty 1

## 2014-07-17 MED ORDER — ALPRAZOLAM 0.5 MG PO TABS: 0.5000 mg | ORAL_TABLET | Freq: Two times a day (BID) | ORAL | Status: DC | PRN

## 2014-07-17 MED ORDER — FLUTICASONE FUROATE 27.5 MCG/SPRAY NA SUSP: 2.0000 | Freq: Every morning | NASAL | Status: DC

## 2014-07-17 MED ORDER — ONDANSETRON HCL 4 MG/2ML IJ SOLN: 4.0000 mg | Freq: Once | INTRAMUSCULAR | Status: DC | PRN

## 2014-07-17 MED ORDER — FENTANYL CITRATE 0.05 MG/ML IJ SOLN
INTRAMUSCULAR | Status: AC
  Filled 2014-07-17: qty 2

## 2014-07-17 MED ORDER — LIDOCAINE HCL (CARDIAC) 20 MG/ML IV SOLN
INTRAVENOUS | Status: AC
  Filled 2014-07-17: qty 5

## 2014-07-17 MED ORDER — METOPROLOL TARTRATE 1 MG/ML IV SOLN
5.0000 mg | Freq: Four times a day (QID) | INTRAVENOUS | Status: DC
  Administered 2014-07-18 – 2014-07-20 (×8): 5 mg via INTRAVENOUS
  Filled 2014-07-17 (×15): qty 5

## 2014-07-17 MED ORDER — PAROXETINE HCL 20 MG PO TABS
20.0000 mg | ORAL_TABLET | Freq: Every day | ORAL | Status: DC
  Administered 2014-07-18 – 2014-07-21 (×4): 20 mg via ORAL
  Filled 2014-07-17 (×4): qty 1

## 2014-07-17 MED ORDER — METOPROLOL TARTRATE 12.5 MG HALF TABLET
12.5000 mg | ORAL_TABLET | Freq: Two times a day (BID) | ORAL | Status: DC
  Administered 2014-07-17 – 2014-07-19 (×5): 12.5 mg via ORAL
  Filled 2014-07-17 (×7): qty 1

## 2014-07-17 MED ORDER — ALVIMOPAN 12 MG PO CAPS
12.0000 mg | ORAL_CAPSULE | Freq: Two times a day (BID) | ORAL | Status: DC
  Administered 2014-07-18 (×2): 12 mg via ORAL
  Filled 2014-07-17 (×4): qty 1

## 2014-07-17 MED ORDER — ALUM & MAG HYDROXIDE-SIMETH 200-200-20 MG/5ML PO SUSP
30.0000 mL | Freq: Four times a day (QID) | ORAL | Status: DC | PRN
  Filled 2014-07-17: qty 30

## 2014-07-17 MED ORDER — 0.9 % SODIUM CHLORIDE (POUR BTL) OPTIME
TOPICAL | Status: DC | PRN
  Administered 2014-07-17: 2000 mL

## 2014-07-17 MED ORDER — MAGIC MOUTHWASH: 15.0000 mL | Freq: Four times a day (QID) | ORAL | Status: DC | PRN

## 2014-07-17 MED ORDER — SACCHAROMYCES BOULARDII 250 MG PO CAPS
250.0000 mg | ORAL_CAPSULE | Freq: Two times a day (BID) | ORAL | Status: DC
  Administered 2014-07-17 – 2014-07-21 (×8): 250 mg via ORAL
  Filled 2014-07-17 (×9): qty 1

## 2014-07-17 MED ORDER — PROPOFOL 10 MG/ML IV BOLUS
INTRAVENOUS | Status: AC
  Filled 2014-07-17: qty 20

## 2014-07-17 MED ORDER — HEPARIN SODIUM (PORCINE) 5000 UNIT/ML IJ SOLN
5000.0000 [IU] | Freq: Once | INTRAMUSCULAR | Status: AC
  Administered 2014-07-17: 5000 [IU] via SUBCUTANEOUS
  Filled 2014-07-17: qty 1

## 2014-07-17 MED ORDER — ZOLPIDEM TARTRATE 5 MG PO TABS
5.0000 mg | ORAL_TABLET | Freq: Every evening | ORAL | Status: DC | PRN
  Administered 2014-07-18: 5 mg via ORAL
  Filled 2014-07-17: qty 1

## 2014-07-17 MED ORDER — PRENATAL 27-0.8 MG PO TABS
1.0000 | ORAL_TABLET | Freq: Every day | ORAL | Status: DC
  Administered 2014-07-18 – 2014-07-20 (×3): 1 via ORAL
  Filled 2014-07-17 (×4): qty 1

## 2014-07-17 MED ORDER — HYDROMORPHONE HCL 1 MG/ML IJ SOLN
0.5000 mg | INTRAMUSCULAR | Status: DC | PRN
  Administered 2014-07-17 – 2014-07-18 (×4): 1 mg via INTRAVENOUS
  Filled 2014-07-17 (×4): qty 1

## 2014-07-17 MED ORDER — NEOSTIGMINE METHYLSULFATE 10 MG/10ML IV SOLN
INTRAVENOUS | Status: DC | PRN
  Administered 2014-07-17: 3 mg via INTRAVENOUS

## 2014-07-17 MED ORDER — SUCCINYLCHOLINE CHLORIDE 20 MG/ML IJ SOLN
INTRAMUSCULAR | Status: DC | PRN
  Administered 2014-07-17: 100 mg via INTRAVENOUS

## 2014-07-17 MED ORDER — ACETAMINOPHEN 10 MG/ML IV SOLN
1000.0000 mg | Freq: Once | INTRAVENOUS | Status: AC
  Filled 2014-07-17: qty 100

## 2014-07-17 MED ORDER — ONDANSETRON HCL 4 MG/2ML IJ SOLN: 4.0000 mg | Freq: Four times a day (QID) | INTRAMUSCULAR | Status: DC | PRN

## 2014-07-17 MED ORDER — DEXAMETHASONE SODIUM PHOSPHATE 10 MG/ML IJ SOLN
INTRAMUSCULAR | Status: DC | PRN
  Administered 2014-07-17: 10 mg via INTRAVENOUS

## 2014-07-17 MED ORDER — MIDAZOLAM HCL 2 MG/2ML IJ SOLN
INTRAMUSCULAR | Status: AC
  Filled 2014-07-17: qty 2

## 2014-07-17 SURGICAL SUPPLY — 122 items
APPLIER CLIP 5 13 M/L LIGAMAX5 (MISCELLANEOUS)
APPLIER CLIP ROT 10 11.4 M/L (STAPLE)
APR CLP MED LRG 11.4X10 (STAPLE)
APR CLP MED LRG 5 ANG JAW (MISCELLANEOUS)
BLADE EXTENDED COATED 6.5IN (ELECTRODE) ×1 IMPLANT
BLADE SURG SZ11 CARB STEEL (BLADE) ×3 IMPLANT
CABLE HIGH FREQUENCY MONO STRZ (ELECTRODE) IMPLANT
CANNULA REDUC XI 12-8 STAPL (CANNULA) ×1
CANNULA REDUC XI 12-8MM STAPL (CANNULA) ×1
CANNULA REDUCER 12-8 DVNC XI (CANNULA) IMPLANT
CATH KIT ON-Q SILVERSOAK 7.5 (CATHETERS) IMPLANT
CATH KIT ON-Q SILVERSOAK 7.5IN (CATHETERS) IMPLANT
CATH ROBINSON RED A/P 22FR (CATHETERS) IMPLANT
CELLS DAT CNTRL 66122 CELL SVR (MISCELLANEOUS) IMPLANT
CHLORAPREP W/TINT 26ML (MISCELLANEOUS) ×3 IMPLANT
CLIP APPLIE 5 13 M/L LIGAMAX5 (MISCELLANEOUS) IMPLANT
CLIP APPLIE ROT 10 11.4 M/L (STAPLE) IMPLANT
CLIP LIGATING HEM O LOK PURPLE (MISCELLANEOUS) IMPLANT
CLIP LIGATING HEMO O LOK GREEN (MISCELLANEOUS) IMPLANT
CLIP LIGATING HEMOLOK MED (MISCELLANEOUS) IMPLANT
COVER MAYO STAND STRL (DRAPES) ×3 IMPLANT
COVER TIP SHEARS 8 DVNC (MISCELLANEOUS) ×1 IMPLANT
COVER TIP SHEARS 8MM DA VINCI (MISCELLANEOUS) ×2
DECANTER SPIKE VIAL GLASS SM (MISCELLANEOUS) ×2 IMPLANT
DEVICE TROCAR PUNCTURE CLOSURE (ENDOMECHANICALS) IMPLANT
DRAIN CHANNEL 19F RND (DRAIN) IMPLANT
DRAPE ARM DVNC X/XI (DISPOSABLE) ×4 IMPLANT
DRAPE COLUMN DVNC XI (DISPOSABLE) ×1 IMPLANT
DRAPE DA VINCI XI ARM (DISPOSABLE) ×8
DRAPE DA VINCI XI COLUMN (DISPOSABLE) ×2
DRAPE SHEET LG 3/4 BI-LAMINATE (DRAPES) ×5 IMPLANT
DRAPE SURG IRRIG POUCH 19X23 (DRAPES) ×1 IMPLANT
DRAPE WARM FLUID 44X44 (DRAPE) ×3 IMPLANT
DRSG OPSITE POSTOP 3X4 (GAUZE/BANDAGES/DRESSINGS) ×2 IMPLANT
DRSG OPSITE POSTOP 4X10 (GAUZE/BANDAGES/DRESSINGS) IMPLANT
DRSG OPSITE POSTOP 4X6 (GAUZE/BANDAGES/DRESSINGS) IMPLANT
DRSG OPSITE POSTOP 4X8 (GAUZE/BANDAGES/DRESSINGS) IMPLANT
DRSG TEGADERM 2-3/8X2-3/4 SM (GAUZE/BANDAGES/DRESSINGS) ×9 IMPLANT
DRSG TEGADERM 4X4.75 (GAUZE/BANDAGES/DRESSINGS) IMPLANT
ELECT PENCIL ROCKER SW 15FT (MISCELLANEOUS) ×6 IMPLANT
ELECT REM PT RETURN 9FT ADLT (ELECTROSURGICAL) ×3
ELECTRODE REM PT RTRN 9FT ADLT (ELECTROSURGICAL) ×1 IMPLANT
ENDOLOOP SUT PDS II  0 18 (SUTURE)
ENDOLOOP SUT PDS II 0 18 (SUTURE) IMPLANT
EVACUATOR SILICONE 100CC (DRAIN) IMPLANT
GAUZE SPONGE 2X2 8PLY STRL LF (GAUZE/BANDAGES/DRESSINGS) ×1 IMPLANT
GAUZE SPONGE 4X4 12PLY STRL (GAUZE/BANDAGES/DRESSINGS) IMPLANT
GLOVE ECLIPSE 8.0 STRL XLNG CF (GLOVE) ×9 IMPLANT
GLOVE INDICATOR 8.0 STRL GRN (GLOVE) ×9 IMPLANT
GOWN STRL REUS W/TWL XL LVL3 (GOWN DISPOSABLE) ×12 IMPLANT
KIT BASIN OR (CUSTOM PROCEDURE TRAY) ×6 IMPLANT
KIT PROCEDURE DA VINCI SI (MISCELLANEOUS) ×2
KIT PROCEDURE DVNC SI (MISCELLANEOUS) ×1 IMPLANT
LEGGING LITHOTOMY PAIR STRL (DRAPES) ×3 IMPLANT
LUBRICANT JELLY K Y 4OZ (MISCELLANEOUS) ×3 IMPLANT
NDL INSUFFLATION 14GA 120MM (NEEDLE) ×1 IMPLANT
NEEDLE HYPO 22GX1.5 SAFETY (NEEDLE) ×3 IMPLANT
NEEDLE INSUFFLATION 14GA 120MM (NEEDLE) ×3 IMPLANT
PACK CARDIOVASCULAR III (CUSTOM PROCEDURE TRAY) ×3 IMPLANT
PACK GENERAL/GYN (CUSTOM PROCEDURE TRAY) ×3 IMPLANT
PEN SKIN MARKING BROAD (MISCELLANEOUS) ×3 IMPLANT
PORT LAP GEL ALEXIS MED 5-9CM (MISCELLANEOUS) ×2 IMPLANT
PUMP PAIN ON-Q (MISCELLANEOUS) IMPLANT
RELOAD STAPLE 45 BLU REG DVNC (STAPLE) IMPLANT
RELOAD STAPLE 45 GRN THCK DVNC (STAPLE) IMPLANT
RETRACTOR WND ALEXIS 18 MED (MISCELLANEOUS) IMPLANT
RTRCTR WOUND ALEXIS 18CM MED (MISCELLANEOUS)
SCISSORS LAP 5X45 EPIX DISP (ENDOMECHANICALS) IMPLANT
SCRUB PCMX 4 OZ (MISCELLANEOUS) ×3 IMPLANT
SEAL CANN UNIV 5-8 DVNC XI (MISCELLANEOUS) ×3 IMPLANT
SEAL XI 5MM-8MM UNIVERSAL (MISCELLANEOUS) ×6
SEALER TISSUE G2 STRG ARTC 35C (ENDOMECHANICALS) IMPLANT
SEALER VESSEL DA VINCI XI (MISCELLANEOUS)
SEALER VESSEL EXT DVNC XI (MISCELLANEOUS) IMPLANT
SET IRRIG TUBING LAPAROSCOPIC (IRRIGATION / IRRIGATOR) ×3 IMPLANT
SLEEVE XCEL OPT CAN 5 100 (ENDOMECHANICALS) IMPLANT
SOLUTION ELECTROLUBE (MISCELLANEOUS) ×3 IMPLANT
SPONGE GAUZE 2X2 STER 10/PKG (GAUZE/BANDAGES/DRESSINGS)
SPONGE LAP 18X18 X RAY DECT (DISPOSABLE) IMPLANT
STAPLER 45 BLU RELOAD XI (STAPLE) ×3 IMPLANT
STAPLER 45 BLUE RELOAD XI (STAPLE) ×6
STAPLER 45 GREEN RELOAD XI (STAPLE)
STAPLER 45 GRN RELOAD XI (STAPLE) IMPLANT
STAPLER CANNULA SEAL DVNC XI (STAPLE) ×1 IMPLANT
STAPLER CANNULA SEAL XI (STAPLE) ×2
STAPLER SHEATH (SHEATH) ×2
STAPLER SHEATH ENDOWRIST DVNC (SHEATH) IMPLANT
SUCTION POOLE TIP (SUCTIONS) ×3 IMPLANT
SUT MNCRL AB 4-0 PS2 18 (SUTURE) ×3 IMPLANT
SUT PDS AB 1 CTX 36 (SUTURE) ×6 IMPLANT
SUT PDS AB 1 TP1 96 (SUTURE) IMPLANT
SUT PDS AB 2-0 CT2 27 (SUTURE) IMPLANT
SUT PROLENE 0 CT 2 (SUTURE) ×1 IMPLANT
SUT PROLENE 2 0 SH DA (SUTURE) IMPLANT
SUT SILK 2 0 (SUTURE) ×3
SUT SILK 2 0 SH (SUTURE) ×4 IMPLANT
SUT SILK 2 0 SH CR/8 (SUTURE) ×3 IMPLANT
SUT SILK 2-0 18XBRD TIE 12 (SUTURE) ×1 IMPLANT
SUT SILK 3 0 (SUTURE) ×3
SUT SILK 3 0 SH CR/8 (SUTURE) ×3 IMPLANT
SUT SILK 3-0 18XBRD TIE 12 (SUTURE) ×1 IMPLANT
SUT V-LOC BARB 180 2/0GR6 GS22 (SUTURE) ×6
SUT VIC AB 3-0 SH 18 (SUTURE) IMPLANT
SUT VIC AB 3-0 SH 27 (SUTURE)
SUT VIC AB 3-0 SH 27XBRD (SUTURE) IMPLANT
SUT VICRYL 0 UR6 27IN ABS (SUTURE) ×1 IMPLANT
SUT VLOC 180 2-0 9IN GS21 (SUTURE) IMPLANT
SUTURE V-LC BRB 180 2/0GR6GS22 (SUTURE) IMPLANT
SYR 20CC LL (SYRINGE) ×3 IMPLANT
SYRINGE 10CC LL (SYRINGE) ×3 IMPLANT
SYS LAPSCP GELPORT 120MM (MISCELLANEOUS)
SYSTEM LAPSCP GELPORT 120MM (MISCELLANEOUS) IMPLANT
TAPE UMBILICAL COTTON 1/8X30 (MISCELLANEOUS) ×1 IMPLANT
TOWEL OR 17X26 10 PK STRL BLUE (TOWEL DISPOSABLE) ×6 IMPLANT
TOWEL OR NON WOVEN STRL DISP B (DISPOSABLE) ×3 IMPLANT
TRAY FOLEY CATH 14FRSI W/METER (CATHETERS) ×3 IMPLANT
TROCAR BLADELESS OPT 5 100 (ENDOMECHANICALS) ×3 IMPLANT
TUBING CONNECTING 10 (TUBING) IMPLANT
TUBING CONNECTING 10' (TUBING)
TUBING FILTER THERMOFLATOR (ELECTROSURGICAL) ×3 IMPLANT
TUNNELER SHEATH ON-Q 16GX12 DP (PAIN MANAGEMENT) IMPLANT
YANKAUER SUCT BULB TIP 10FT TU (MISCELLANEOUS) ×3 IMPLANT

## 2014-07-17 NOTE — H&P (Signed)
Eileen Holland  Location: [facility] Patient #: [id number] DOB: [birth year] Single / Language: [name] / Race: Black or African American Female  History of Present Illness [name] MD; 06/16/2014 11:07 AM) Patient words: Cecal polyp removal.  The patient is a 53 year old female who presents with a colonic polyp. Patient sent to me from Dr. Lucio Edward with [facility] Pleasant woman underwent screening colonoscopy. Recalls father having polyps found in his 41s. One brother with Crohn's disease. Otherwise no history of other inflammatory bowel or gastroenterology issues. She has mild constipation. Improved with stool softener. Tends to have one bowel movement a day. Mildly irritating hemorrhoid but usually well controlled when bowels regular. History of robotic partial hysterectomy but no other abdominal surgeries.   On colonoscopy last month, she was found to have a large polyp in the proximal colon. 3.5 cm at cecum. Biopsy shows adenomatous polyp. Felt to be too large to remove by endoscopy. Patient did not want to consider endoscopic resection at tertiary referral center and referred to consider surgery to remove the polyp. Therefore consultation requested.   No personal nor family history of GI/colon cancer, UC, irritable bowel syndrome, allergy such as Celiac Sprue, dietary/dairy problems, colitis, ulcers nor gastritis. No recent sick contacts/gastroenteritis. No travel outside the country. No changes in diet. No dysphagia to solids or liquids. No significant heartburn or reflux. No hematochezia, hematemesis, coffee ground emesis. No evidence of prior gastric/peptic ulceration. She works in the lab. Can walk a half hour without difficulty. No history of cardiopulmonary issues.   Other Problems [name], CMA; 06/16/2014 9:53 AM) Depression Hemorrhoids High blood pressure Migraine Headache  Past Surgical History ([place]; 06/16/2014 9:53  AM) Hysterectomy (not due to cancer) - Partial Knee Surgery Right. Oral Surgery  Diagnostic Studies History [name], CMA; 06/16/2014 9:53 AM) Colonoscopy within last year Mammogram 1-3 years ago Pap Smear 1-5 years ago  Allergies [name], CMA; 06/16/2014 9:55 AM) Aspirin *ANALGESICS - NonNarcotic* Ibuprofen *ANALGESICS - ANTI-INFLAMMATORY*  Medication History ([name], CMA; 06/16/2014 9:55 AM) AmLODIPine Besylate (5MG  Tablet, Oral daily) Active. Cyclobenzaprine HCl (10MG  Tablet, Oral) Active. Hydrochlorothiazide (25MG  Tablet, Oral daily) Active. Fluticasone Propionate (50MCG/ACT Suspension, Nasal as needed) Active. Levocetirizine Dihydrochloride (5MG  Tablet, Oral daily) Active. Metoprolol Succinate ER (100MG  Tablet ER 24HR, Oral daily) Active. Losartan Potassium (100MG  Tablet, Oral daily) Active. PARoxetine HCl (20MG  Tablet, Oral daily) Active. Topiramate (50MG  Tablet, Oral daily) Active.  Social History [name], [place]; 06/16/2014 9:53 AM) Alcohol use Occasional alcohol use. Caffeine use Carbonated beverages, Coffee, Tea. Illicit drug use Remotely quit drug use. Tobacco use Current every day smoker.  Family History [name], CMA; 06/16/2014 9:53 AM) Alcohol Abuse Brother, Father. Arthritis Mother. Breast Cancer Sister. Colon Polyps Father. Depression Daughter, Sister. Diabetes Mellitus Brother, Mother. Hypertension Brother, Father. Migraine Headache Daughter, Sister.  Pregnancy / Birth History [name], [place]; 06/16/2014 9:53 AM) Age at menarche 61 years. Contraceptive History Depo-provera, Oral contraceptives. Gravida 1 Maternal age 77-20 Para 1  Review of Systems ([place]; 06/16/2014 9:53 AM) Skin Not Present- Change in Wart/Mole, Dryness, Hives, Jaundice, New Lesions, Non-Healing Wounds, Rash and Ulcer. HEENT Present- Seasonal Allergies and Wears glasses/contact lenses. Not Present- Earache, Hearing Loss,  Hoarseness, Nose Bleed, Oral Ulcers, Ringing in the Ears, Sinus Pain, Sore Throat, Visual Disturbances and Yellow Eyes. Respiratory Present- Snoring. Not Present- Bloody sputum, Chronic Cough, Difficulty Breathing and Wheezing. Breast Not Present- Breast Mass, Breast Pain, Nipple Discharge and Skin Changes. Cardiovascular Not  Present- Chest Pain, Difficulty Breathing Lying Down, Leg Cramps, Palpitations, Rapid Heart Rate, Shortness of Breath and Swelling of Extremities. Gastrointestinal Present- Hemorrhoids. Not Present- Abdominal Pain, Bloating, Bloody Stool, Change in Bowel Habits, Chronic diarrhea, Constipation, Difficulty Swallowing, Excessive gas, Gets full quickly at meals, Indigestion, Nausea, Rectal Pain and Vomiting. Female Genitourinary Not Present- Frequency, Nocturia, Painful Urination, Pelvic Pain and Urgency. Musculoskeletal Not Present- Back Pain, Joint Pain, Joint Stiffness, Muscle Pain, Muscle Weakness and Swelling of Extremities. Neurological Not Present- Decreased Memory, Fainting, Headaches, Numbness, Seizures, Tingling, Tremor, Trouble walking and Weakness. Psychiatric Not Present- Anxiety, Bipolar, Change in Sleep Pattern, Depression, Fearful and Frequent crying. Endocrine Not Present- Cold Intolerance, Excessive Hunger, Hair Changes, Heat Intolerance, Hot flashes and New Diabetes. Hematology Not Present- Easy Bruising, Excessive bleeding, Gland problems, HIV and Persistent Infections.   Vitals ([name] CMA; 06/16/2014 9:56 AM) 06/16/2014 9:55 AM Weight: 164 lb Height: 62in Body Surface Area: 1.8 m Body Mass Index: 30 kg/m Temp.: 73F(Temporal)  Pulse: 75 (Regular)  BP: 128/82 (Sitting, Left Arm, Standard)    Physical Exam [name] MD; 06/16/2014 10:42 AM) General Mental Status-Alert. General Appearance-Not in acute distress, Not Sickly. Orientation-Oriented X3. Hydration-Well hydrated. Voice-Normal.  Integumentary Global  Assessment Upon inspection and palpation of skin surfaces of the - Axillae: non-tender, no inflammation or ulceration, no drainage. and Distribution of scalp and body hair is normal. General Characteristics Temperature - normal warmth is noted.  Head and Neck Head-normocephalic, atraumatic with no lesions or palpable masses. Face Global Assessment - atraumatic, no absence of expression. Neck Global Assessment - no abnormal movements, no bruit auscultated on the right, no bruit auscultated on the left, no decreased range of motion, non-tender. Trachea-midline. Thyroid Gland Characteristics - non-tender.  Eye Eyeball - Left-Extraocular movements intact, No Nystagmus. Eyeball - Right-Extraocular movements intact, No Nystagmus. Cornea - Left-No Hazy. Cornea - Right-No Hazy. Sclera/Conjunctiva - Left-No scleral icterus, No Discharge. Sclera/Conjunctiva - Right-No scleral icterus, No Discharge. Pupil - Left-Direct reaction to light normal. Pupil - Right-Direct reaction to light normal.  ENMT Ears Pinna - Left - no drainage observed, no generalized tenderness observed. Right - no drainage observed, no generalized tenderness observed. Nose and Sinuses External Inspection of the Nose - no destructive lesion observed. Inspection of the nares - Left - quiet respiration. Right - quiet respiration. Mouth and Throat Lips - Upper Lip - no fissures observed, no pallor noted. Lower Lip - no fissures observed, no pallor noted. Nasopharynx - no discharge present. Oral Cavity/Oropharynx - Tongue - no dryness observed. Oral Mucosa - no cyanosis observed. Hypopharynx - no evidence of airway distress observed.  Chest and Lung Exam Inspection Movements - Normal and Symmetrical. Accessory muscles - No use of accessory muscles in breathing. Palpation Palpation of the chest reveals - Non-tender. Auscultation Breath sounds - Normal and Clear.  Cardiovascular Auscultation Rhythm -  Regular. Murmurs & Other Heart Sounds - Auscultation of the heart reveals - No Murmurs and No Systolic Clicks.  Abdomen Inspection Inspection of the abdomen reveals - No Visible peristalsis and No Abnormal pulsations. Umbilicus - No Bleeding, No Urine drainage. Palpation/Percussion Palpation and Percussion of the abdomen reveal - Soft, Non Tender, No Rebound tenderness, No Rigidity (guarding) and No Cutaneous hyperesthesia. Note: Robotic port incisions well healed   Female Genitourinary Sexual Maturity Tanner 5 - Adult hair pattern. Note: No vaginal bleeding nor discharge   Peripheral Vascular Upper Extremity Inspection - Left - No Cyanotic nailbeds, Not Ischemic. Right - No Cyanotic nailbeds, Not  Ischemic.  Neurologic Neurologic evaluation reveals -normal attention span and ability to concentrate, able to name objects and repeat phrases. Appropriate fund of knowledge , normal sensation and normal coordination. Mental Status Affect - not angry, not paranoid. Cranial Nerves-Normal Bilaterally. Gait-Normal.  Neuropsychiatric Mental status exam performed with findings of-able to articulate well with normal speech/language, rate, volume and coherence, thought content normal with ability to perform basic computations and apply abstract reasoning and no evidence of hallucinations, delusions, obsessions or homicidal/suicidal ideation.  Musculoskeletal Global Assessment Spine, Ribs and Pelvis - no instability, subluxation or laxity. Right Upper Extremity - no instability, subluxation or laxity.  Lymphatic Head & Neck  General Head & Neck Lymphatics: Bilateral - Description - No Localized lymphadenopathy. Axillary  General Axillary Region: Bilateral - Description - No Localized lymphadenopathy. Femoral & Inguinal  Generalized Femoral & Inguinal Lymphatics: Left - Description - No Localized lymphadenopathy. Right - Description - No Localized  lymphadenopathy.    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 It too large to safely remove by polypectomy only. Patient declines quaternary referral for possible endoscopic mucosal resection. Therefore recommend segmental colon resection. Good candidate for a MIS robotic approach with in-line "isoperistaltic" intracorporeal anastomosis. d/w the patient. She agrees to proceed  Current Plans  Schedule for Surgery The anatomy & physiology of the digestive tract was discussed. The pathophysiology of the colon was discussed. Natural history risks without surgery was discussed. I feel the risks of no intervention will lead to serious problems that outweigh the operative risks; therefore, I recommended a partial colectomy to remove the pathology. Minimally invasive (Robotic/Laparoscopic) & open techniques were discussed.  Risks such as bleeding, infection, abscess, leak, reoperation, possible ostomy, hernia, heart attack, death, and other risks were discussed. I noted a good likelihood this will help address the problem. Goals of post-operative recovery were discussed as well. Need for adequate nutrition, daily bowel regimen and healthy physical activity, to optimize recovery was noted as well. We will work to minimize complications. Educational materials were available as well. Questions were answered. The patient expresses understanding & wishes to proceed with surgery.   Pt Education - [facility] Bowel Prep Pt Education - [facility] Good Bowel Health (Kunio Cummiskey) Pt Education - [facility] Abdominal Surgery ([employer]) Started Neomycin Sulfate 500MG , 2 (two) Tablet SEE NOTE, #6, 06/16/2014, No Refill. Local Order: TAKE TWO TABLETS AT 2 PM, 3 PM, AND 10 PM THE DAY PRIOR TO SURGERY Started Flagyl 500MG , 2 (two) Tablet SEE NOTE, #6, 06/16/2014, No Refill. Local Order: Take at 2pm, 3pm, and 10pm the day  prior to your colon operation TOBACCO ABUSE (305.1  Z72.0) Impression: QUIT SMOKING! Current Plans Free Text Instructions : discussed with patient and provided information.   Signed by [name], MD  [name], M.D., F.A.C.S. Gastrointestinal and Minimally Invasive Surgery [facility] [address] [place], [place] [address] [phone] Main / Paging

## 2014-07-17 NOTE — Anesthesia Procedure Notes (Signed)
Procedure Name: Intubation Date/Time: 07/17/2014 12:43 PM Performed by: Danley Danker L Patient Re-evaluated:Patient Re-evaluated prior to inductionOxygen Delivery Method: Circle system utilized Preoxygenation: Pre-oxygenation with 100% oxygen Intubation Type: IV induction Ventilation: Mask ventilation without difficulty Laryngoscope Size: Miller and 2 Grade View: Grade I Tube type: Oral Tube size: 7.5 mm Number of attempts: 1 Airway Equipment and Method: Stylet Placement Confirmation: ETT inserted through vocal cords under direct vision,  breath sounds checked- equal and bilateral and positive ETCO2 Secured at: 20 cm Tube secured with: Tape Dental Injury: Teeth and Oropharynx as per pre-operative assessment

## 2014-07-17 NOTE — Progress Notes (Signed)
PACU note-----blood pressure 90'/60's and decreased urine output; Dr. Jillyn Hidden in to check pt; orders rec'd;  Continue to give pt extra IV fluid and observe

## 2014-07-17 NOTE — Transfer of Care (Signed)
Immediate Anesthesia Transfer of Care Note  Patient: Eileen Holland  Procedure(s) Performed: Procedure(s): XI ROBOT ASSISTED LAPAROSCOPIC PROXIMAL RIGHT COLECTOMY (N/A)  Patient Location: PACU  Anesthesia Type:General  Level of Consciousness: awake, alert , oriented and patient cooperative  Airway & Oxygen Therapy: Patient Spontanous Breathing and Patient connected to face mask oxygen  Post-op Assessment: Report given to PACU RN, Post -op Vital signs reviewed and stable and Patient moving all extremities  Post vital signs: Reviewed and stable  Complications: No apparent anesthesia complications

## 2014-07-17 NOTE — Op Note (Signed)
07/17/2014  4:02 PM  PATIENT:  Eileen Holland  53 y.o. female  Patient Care Team: [name], MD as PCP - General (Family Medicine)  PRE-OPERATIVE DIAGNOSIS:  cecal colon polyp  POST-OPERATIVE DIAGNOSIS:  Large cecal colon polyp  PROCEDURE:  Procedure(s): XI ROBOT ASSISTED LAPAROSCOPIC PROXIMAL RIGHT COLECTOMY  SURGEON:  Surgeon(s): Michael Boston, MD  ASSISTANT: Leighton Ruff, MD   ANESTHESIA:   local and general  EBL:  Total I/O In: 2000 [I.V.:2000] Out: 59 [Urine:50]  Delay start of Pharmacological VTE agent (>24hrs) due to surgical blood loss or risk of bleeding:  no  DRAINS: none   SPECIMEN:  Proximal "right" colon (polyp in cecum)  DISPOSITION OF SPECIMEN:  PATHOLOGY  COUNTS:  YES  PLAN OF CARE: Admit to inpatient   PATIENT DISPOSITION:  PACU - hemodynamically stable.  INDICATION:    Pleasant woman with large polyp in proximal colon at cecum.  Too large to safely be removed endoscopically.  I recommended segmental resection:  The anatomy & physiology of the digestive tract was discussed.  The pathophysiology was discussed.  Natural history risks without surgery was discussed.   I worked to give an overview of the disease and the frequent need to have multispecialty involvement.  I feel the risks of no intervention will lead to serious problems that outweigh the operative risks; therefore, I recommended a partial colectomy to remove the pathology.  Laparoscopic & open techniques were discussed.   Risks such as bleeding, infection, abscess, leak, reoperation, possible ostomy, hernia, heart attack, death, and other risks were discussed.  I noted a good likelihood this will help address the problem.   Goals of post-operative recovery were discussed as well.  We will work to minimize complications.  An educational handout on the pathology was given as well.  Questions were answered.    The patient expresses understanding & wishes to proceed with surgery.  OR FINDINGS:    Patient had a bulky but soft polyp in cecum.  4 cm.  No obvious metastatic disease on visceral parietal peritoneum or liver.  It is an ileocolonic anastomosis that rests in the hepatic flexure.  Side to side isoperistaltic  DESCRIPTION:   Informed consent was confirmed.  The patient underwent general anaesthesia without difficulty.  The patient was positioned with arms tucked & secured appropriately.  VTE prevention in place.  The patient's abdomen was clipped, prepped, & draped in a sterile fashion.  Surgical timeout confirmed our plan.  The patient was positioned in reverse Trendelenburg.  Abdominal entry was gained using Veress needle technique using trach hook on the anterior abdominal wall fascia for countertension in the left upper abdomen.  Entry was clean.  I induced carbon dioxide insufflation.  Camera inspection revealed no injury.  Extra ports were carefully placed under direct laparoscopic visualization.  We docked the [employer]uitive Vinci robot carefully and placed intstruments under visualization  I mobilized & reflected the greater omentum in the upper abdomen.   I was able to elevate the proximal colon to isolate the ileocolonic pedicle.  I scored the ileal mesentery just proximal to that.   I carried that further dissection in a medial to lateral fashion.  I was able to bluntly get into the retro-mesenteric plane on the right side.  I freed the proximal right sided colonic mesentery off the retroperitoneum including the duodenal sweep, pancreatic head, & Gerota's fascia of the right kidney. I was able to get underneath the hepatic flexure.  I was able to get  underneath the proximal and mid transverse colon.  I isolated the proximal ileocecal pedicle.  I skeletonized it & transected the vessels.  And up elevating the proximal transverse colon off the gallbladder.  Patient had a small right colic pedicle was very thin.  Much more impressive middle colic artery.  I ligated the right colic  pedicle.  I then proceeded to mobilize the terminal ileum & proximal "right" colon in a lateral to medial fashion.  I mobilized the distal ileal mesentery off its retroperitoneal and pelvic attachments.  I mobilized the ascending colon off It is side wall attachments to the paracolic gutter and retroperitoneum.  I also mobilized the greater omentum off the proximal transverse colon and mobilized the mid to proximal transverse colon in a superior to inferior fashion.  This allowed me to mobilize the hepatic flexure and get a complete mobilization of the proximal "right" colon to the mid-transverse colon..  I could isolate the pathology. When he went ahead and proceeded with transection.  I transected the mesentery of the distal ileum just proximal to the ileocolic pedicle.  I transected in the distal ileum with a robotic stapler.  I then chose an area in the proximal/mid transverse colon near the right middle colic pedicle.  I transected the proximal colon mesentery just proximal to this middle colic pedicle  Then transected at the proximal transverse colon with a robotic stapler.   We assured hemostasis.     I then proceeded with a isoperistaltic take anastomosis.  I brought the ileum up to the proximal transverse colon at the hepatic flexure.  I placed a silk stitch at the proximal end of the transverse colon and 10 cm proximal to the ileal staple line.  Allowed the staple end of the ileum to lay more distally.  I did a side-to-side stapled anastomosis of ileum to mid-transverse colon using a 5mm robotic stapler.  I sewed the common staple channel wound with an absorbable suture (2-0 V-lock) x2 in a Connell fashion.  We did reinspection of the abdomen.  Hemostasis was good.   Ureters, retroperitoneum, and bowel uninjured.  The anastomosis looked healthy.   We did a final irrigation of antibiotic solution (900 mg clindamycin/240 mg gentamicin in a liter of crystalloid).   I placed the wound protector  through the stapler port left paramedian after enlarging the wound to 3 cm.  Specimen removed without incident.   Ports and wound protector removed.  The patient was re-draped.  Sterile unused instruments were used from this point out per colon SSI prevention protocol.  Fascia at the extraction site closed with PDS suture.   Skin closed using Monocryl stitch and sterile dressing.  I closed the skin with some interrupted Monocryl stitches. I placed wicks in the extraction site x2 between those areas. I placed sterile dressing.   Patient is being extubated go to recovery room. I discussed postop care with the patient in detail the office & in the holding area. Instructions are written.  I updated the patient's status to the family.  Recommendations were made.  Questions were answered.  The family expressed understanding & appreciation.  [name], M.D., F.A.C.S. Gastrointestinal and Minimally Invasive Surgery [facility] [address], [place], [place] [address] [phone] Main / Paging

## 2014-07-17 NOTE — Discharge Instructions (Signed)
ABDOMINAL SURGERY: POST OP INSTRUCTIONS ° °1. DIET: Follow a light bland diet the first 24 hours after arrival home, such as soup, liquids, crackers, etc.  Be sure to include lots of fluids daily.  Avoid fast food or heavy meals as your are more likely to get nauseated.  Eat a low fat the next few days after surgery.   °2. Take your usually prescribed home medications unless otherwise directed. °3. PAIN CONTROL: °a. Pain is best controlled by a usual combination of three different methods TOGETHER: °i. Ice/Heat °ii. Over the counter pain medication °iii. Prescription pain medication °b. Most patients will experience some swelling and bruising around the incisions.  Ice packs or heating pads (30-60 minutes up to 6 times a day) will help. Use ice for the first few days to help decrease swelling and bruising, then switch to heat to help relax tight/sore spots and speed recovery.  Some people prefer to use ice alone, heat alone, alternating between ice & heat.  Experiment to what works for you.  Swelling and bruising can take several weeks to resolve.   °c. It is helpful to take an over-the-counter pain medication regularly for the first few weeks.  Choose one of the following that works best for you: °i. Naproxen (Aleve, etc)  Two 220mg tabs twice a day °ii. Ibuprofen (Advil, etc) Three 200mg tabs four times a day (every meal & bedtime) °iii. Acetaminophen (Tylenol, etc) 500-650mg four times a day (every meal & bedtime) °d. A  prescription for pain medication (such as oxycodone, hydrocodone, etc) should be given to you upon discharge.  Take your pain medication as prescribed.  °i. If you are having problems/concerns with the prescription medicine (does not control pain, nausea, vomiting, rash, itching, etc), please call us [phone] to see if we need to switch you to a different pain medicine that will work better for you and/or control your side effect better. °ii. If you need a refill on your pain medication,  please contact your pharmacy.  They will contact our office to request authorization. Prescriptions will not be filled after 5 pm or on week-ends. °4. Avoid getting constipated.  Between the surgery and the pain medications, it is common to experience some constipation.  Increasing fluid intake and taking a fiber supplement (such as Metamucil, Citrucel, FiberCon, MiraLax, etc) 1-2 times a day regularly will usually help prevent this problem from occurring.  A mild laxative (prune juice, Milk of Magnesia, MiraLax, etc) should be taken according to package directions if there are no bowel movements after 48 hours.   °5. Watch out for diarrhea.  If you have many loose bowel movements, simplify your diet to bland foods & liquids for a few days.  Stop any stool softeners and decrease your fiber supplement.  Switching to mild anti-diarrheal medications (Kayopectate, Pepto Bismol) can help.  If this worsens or does not improve, please call us. °6. Wash / shower every day.  You may shower over the incision / wound.  Avoid baths until the skin is fully healed.  Continue to shower over incision(s) after the dressing is off. °7. Remove your waterproof bandages 5 days after surgery.  You may leave the incision open to air.  You may replace a dressing/Band-Aid to cover the incision for comfort if you wish. °8. ACTIVITIES as tolerated:   °a. You may resume regular (light) daily activities beginning the next day--such as daily self-care, walking, climbing stairs--gradually increasing activities as tolerated.  If you can   walk 30 minutes without difficulty, it is safe to try more intense activity such as jogging, treadmill, bicycling, low-impact aerobics, swimming, etc. b. Save the most intensive and strenuous activity for last such as sit-ups, heavy lifting, contact sports, etc  Refrain from any heavy lifting or straining until you are off narcotics for pain control.   c. DO NOT PUSH THROUGH PAIN.  Let pain be your guide: If it  hurts to do something, don't do it.  Pain is your body warning you to avoid that activity for another week until the pain goes down. d. You may drive when you are no longer taking prescription pain medication, you can comfortably wear a seatbelt, and you can safely maneuver your car and apply brakes. e. Dennis Bast may have sexual intercourse when it is comfortable.  9. FOLLOW UP in our office a. Please call [facility] at [phone] to set up an appointment to see your surgeon in the office for a follow-up appointment approximately 1-2 weeks after your surgery. b. Make sure that you call for this appointment the day you arrive home to insure a convenient appointment time. 10. IF YOU HAVE DISABILITY OR FAMILY LEAVE FORMS, BRING THEM TO THE OFFICE FOR PROCESSING.  DO NOT GIVE THEM TO YOUR DOCTOR.   WHEN TO CALL us [phone]: 1. Poor pain control 2. Reactions / problems with new medications (rash/itching, nausea, etc)  3. Fever over 101.5 F (38.5 C) 4. Inability to urinate 5. Nausea and/or vomiting 6. Worsening swelling or bruising 7. Continued bleeding from incision. 8. Increased pain, redness, or drainage from the incision  The clinic staff is available to answer your questions during regular business hours (8:30am-5pm).  Please dont hesitate to call and ask to speak to one of our nurses for clinical concerns.   A surgeon from [facility] is always on call at the hospitals   If you have a medical emergency, go to the nearest emergency room or call 911.    [facility], [place], [place], [place], [place]  [address] ? MAIN: [phone] ? TOLL FREE: [phone] ? FAX [phone] [email or web address]  GETTING TO GOOD BOWEL HEALTH. Irregular bowel habits such as constipation and diarrhea can lead to many problems over time.  Having one soft bowel movement a day is the most important way to prevent further problems.  The anorectal canal  is designed to handle stretching and feces to safely manage our ability to get rid of solid waste (feces, poop, stool) out of our body.  BUT, hard constipated stools can act like ripping concrete bricks and diarrhea can be a burning fire to this very sensitive area of our body, causing inflamed hemorrhoids, anal fissures, increasing risk is perirectal abscesses, abdominal pain/bloating, an making irritable bowel worse.     The goal: ONE SOFT BOWEL MOVEMENT A DAY!  To have soft, regular bowel movements:   Drink at least 8 tall glasses of water a day.    Take plenty of fiber.  Fiber is the undigested part of plant food that passes into the colon, acting s natures broom to encourage bowel motility and movement.  Fiber can absorb and hold large amounts of water. This results in a larger, bulkier stool, which is soft and easier to pass. Work gradually over several weeks up to 6 servings a day of fiber (25g a day even more if needed) in the form of: o Vegetables -- Root (potatoes, carrots, turnips), leafy green (  lettuce, salad greens, celery, spinach), or cooked high residue (cabbage, broccoli, etc) o Fruit -- Fresh (unpeeled skin & pulp), Dried (prunes, apricots, cherries, etc ),  or stewed ( applesauce)  o Whole grain breads, pasta, etc (whole wheat)  o Bran cereals   Bulking Agents -- This type of water-retaining fiber generally is easily obtained each day by one of the following:  o Psyllium bran -- The psyllium plant is remarkable because its ground seeds can retain so much water. This product is available as Metamucil, Konsyl, Effersyllium, Per Diem Fiber, or the less expensive generic preparation in drug and health food stores. Although labeled a laxative, it really is not a laxative.  o Methylcellulose -- This is another fiber derived from wood which also retains water. It is available as Citrucel. o Polyethylene Glycol - and artificial fiber commonly called Miralax or Glycolax.  It is helpful  for people with gassy or bloated feelings with regular fiber o Flax Seed - a less gassy fiber than psyllium  No reading or other relaxing activity while on the toilet. If bowel movements take longer than 5 minutes, you are too constipated  AVOID CONSTIPATION.  High fiber and water intake usually takes care of this.  Sometimes a laxative is needed to stimulate more frequent bowel movements, but   Laxatives are not a good long-term solution as it can wear the colon out. o Osmotics (Milk of Magnesia, Fleets phosphosoda, Magnesium citrate, MiraLax, GoLytely) are safer than  o Stimulants (Senokot, Castor Oil, Dulcolax, Ex Lax)    o Do not take laxatives for more than 7days in a row.   IF SEVERELY CONSTIPATED, try a Bowel Retraining Program: o Do not use laxatives.  o Eat a diet high in roughage, such as bran cereals and leafy vegetables.  o Drink six (6) ounces of prune or apricot juice each morning.  o Eat two (2) large servings of stewed fruit each day.  o Take one (1) heaping tablespoon of a psyllium-based bulking agent twice a day. Use sugar-free sweetener when possible to avoid excessive calories.  o Eat a normal breakfast.  o Set aside 15 minutes after breakfast to sit on the toilet, but do not strain to have a bowel movement.  o If you do not have a bowel movement by the third day, use an enema and repeat the above steps.   Controlling diarrhea o Switch to liquids and simpler foods for a few days to avoid stressing your intestines further. o Avoid dairy products (especially milk & ice cream) for a short time.  The intestines often can lose the ability to digest lactose when stressed. o Avoid foods that cause gassiness or bloating.  Typical foods include beans and other legumes, cabbage, broccoli, and dairy foods.  Every person has some sensitivity to other foods, so listen to our body and avoid those foods that trigger problems for you. o Adding fiber (Citrucel, Metamucil, psyllium,  Miralax) gradually can help thicken stools by absorbing excess fluid and retrain the intestines to act more normally.  Slowly increase the dose over a few weeks.  Too much fiber too soon can backfire and cause cramping & bloating. o Probiotics (such as active yogurt, Align, etc) may help repopulate the intestines and colon with normal bacteria and calm down a sensitive digestive tract.  Most studies show it to be of mild help, though, and such products can be costly. o Medicines: - Bismuth subsalicylate (ex. Kayopectate, Pepto Bismol) every 30 minutes for  up to 6 doses can help control diarrhea.  Avoid if pregnant. - Loperamide (Immodium) can slow down diarrhea.  Start with two tablets (4mg  total) first and then try one tablet every 6 hours.  Avoid if you are having fevers or severe pain.  If you are not better or start feeling worse, stop all medicines and call your doctor for advice o Call your doctor if you are getting worse or not better.  Sometimes further testing (cultures, endoscopy, X-ray studies, bloodwork, etc) may be needed to help diagnose and treat the cause of the diarrhea.  Managing Pain  Pain after surgery or related to activity is often due to strain/injury to muscle, tendon, nerves and/or incisions.  This pain is usually short-term and will improve in a few months.   Many people find it helpful to do the following things TOGETHER to help speed the process of healing and to get back to regular activity more quickly:  1. Avoid heavy physical activity a.  no lifting greater than 20 pounds b. Do not push through the pain.  Listen to your body and avoid positions and maneuvers than reproduce the pain c. Walking is okay as tolerated, but go slowly and stop when getting sore.  d. Remember: If it hurts to do it, then dont do it! 2. Take Anti-inflammatory medication  a. Take with food/snack around the clock for 1-2 weeks i. This helps the muscle and nerve tissues become less irritable  and calm down faster b. Choose ONE of the following over-the-counter medications: i. Naproxen 220mg  tabs (ex. Aleve) 1-2 pills twice a day  ii. Ibuprofen 200mg  tabs (ex. Advil, Motrin) 3-4 pills with every meal and just before bedtime iii. Acetaminophen 500mg  tabs (Tylenol) 1-2 pills with every meal and just before bedtime 3. Use a Heating pad or Ice/Cold Pack a. 4-6 times a day b. May use warm bath/hottub  or showers 4. Try Gentle Massage and/or Stretching  a. at the area of pain many times a day b. stop if you feel pain - do not overdo it  Try these steps together to help you body heal faster and avoid making things get worse.  Doing just one of these things may not be enough.    If you are not getting better after two weeks or are noticing you are getting worse, contact our office for further advice; we may need to re-evaluate you & see what other things we can do to help.  Colon Polyps Polyps are lumps of extra tissue growing inside the body. Polyps can grow in the large intestine (colon). Most colon polyps are noncancerous (benign). However, some colon polyps can become cancerous over time. Polyps that are larger than a pea may be harmful. To be safe, caregivers remove and test all polyps. CAUSES  Polyps form when mutations in the genes cause your cells to grow and divide even though no more tissue is needed. RISK FACTORS There are a number of risk factors that can increase your chances of getting colon polyps. They include:  Being older than 50 years.  Family history of colon polyps or colon cancer.  Long-term colon diseases, such as colitis or Crohn disease.  Being overweight.  Smoking.  Being inactive.  Drinking too much alcohol. SYMPTOMS  Most small polyps do not cause symptoms. If symptoms are present, they may include:  Blood in the stool. The stool may look dark red or black.  Constipation or diarrhea that lasts longer than 1 week. DIAGNOSIS  People often do not  know they have polyps until their caregiver finds them during a regular checkup. Your caregiver can use 4 tests to check for polyps:  Digital rectal exam. The caregiver wears gloves and feels inside the rectum. This test would find polyps only in the rectum.  Barium enema. The caregiver puts a liquid called barium into your rectum before taking X-rays of your colon. Barium makes your colon look white. Polyps are dark, so they are easy to see in the X-ray pictures.  Sigmoidoscopy. A thin, flexible tube (sigmoidoscope) is placed into your rectum. The sigmoidoscope has a light and tiny camera in it. The caregiver uses the sigmoidoscope to look at the last third of your colon.  Colonoscopy. This test is like sigmoidoscopy, but the caregiver looks at the entire colon. This is the most common method for finding and removing polyps. TREATMENT  Any polyps will be removed during a sigmoidoscopy or colonoscopy. The polyps are then tested for cancer. PREVENTION  To help lower your risk of getting more colon polyps:  Eat plenty of fruits and vegetables. Avoid eating fatty foods.  Do not smoke.  Avoid drinking alcohol.  Exercise every day.  Lose weight if recommended by your caregiver.  Eat plenty of calcium and folate. Foods that are rich in calcium include milk, cheese, and broccoli. Foods that are rich in folate include chickpeas, kidney beans, and spinach. HOME CARE INSTRUCTIONS Keep all follow-up appointments as directed by your caregiver. You may need periodic exams to check for polyps. SEEK MEDICAL CARE IF: You notice bleeding during a bowel movement. Document Released: 04/27/2004 Document Revised: 10/24/2011 Document Reviewed: 10/11/2011 [facility] Patient Information 2015 [place], [place]. This information is not intended to replace advice given to you by your health care provider. Make sure you discuss any questions you have with your health care provider.

## 2014-07-17 NOTE — Interval H&P Note (Signed)
History and Physical Interval Note:  07/17/2014 11:45 AM  Eileen Holland  has presented today for surgery, with the diagnosis of cecal colon polyp  The various methods of treatment have been discussed with the patient and family. After consideration of risks, benefits and other options for treatment, the patient has consented to  Procedure(s): XI ROBOT ASSISTED LAPAROSCOPIC PARTIAL COLECTOMY (N/A) as a surgical intervention .  The patient's history has been reviewed, patient examined, no change in status, stable for surgery.  I have reviewed the patient's chart and labs.  Questions were answered to the patient's satisfaction.     Tahjae Clausing C.

## 2014-07-17 NOTE — Anesthesia Postprocedure Evaluation (Signed)
  Anesthesia Post-op Note  Patient: Eileen Holland  Procedure(s) Performed: Procedure(s) (LRB): XI ROBOT ASSISTED LAPAROSCOPIC PROXIMAL RIGHT COLECTOMY (N/A)  Patient Location: PACU  Anesthesia Type: General  Level of Consciousness: awake and alert   Airway and Oxygen Therapy: Patient Spontanous Breathing  Post-op Pain: mild  Post-op Assessment: Post-op Vital signs reviewed, Patient's Cardiovascular Status Stable, Respiratory Function Stable, Patent Airway and No signs of Nausea or vomiting  Last Vitals:  Filed Vitals:   07/17/14 1745  BP: 102/57  Pulse: 59  Temp:   Resp: 12    Post-op Vital Signs: stable   Complications: No apparent anesthesia complications

## 2014-07-18 LAB — CBC
HEMATOCRIT: 33.1 % — AB (ref 36.0–46.0)
Hemoglobin: 11.2 g/dL — ABNORMAL LOW (ref 12.0–15.0)
MCH: 35.1 pg — ABNORMAL HIGH (ref 26.0–34.0)
MCHC: 33.8 g/dL (ref 30.0–36.0)
MCV: 103.8 fL — AB (ref 78.0–100.0)
Platelets: 215 10*3/uL (ref 150–400)
RBC: 3.19 MIL/uL — AB (ref 3.87–5.11)
RDW: 11.7 % (ref 11.5–15.5)
WBC: 20.7 10*3/uL — AB (ref 4.0–10.5)

## 2014-07-18 LAB — BASIC METABOLIC PANEL
Anion gap: 12 (ref 5–15)
BUN: 7 mg/dL (ref 6–23)
CO2: 24 mEq/L (ref 19–32)
Calcium: 8.8 mg/dL (ref 8.4–10.5)
Chloride: 101 mEq/L (ref 96–112)
Creatinine, Ser: 0.87 mg/dL (ref 0.50–1.10)
GFR calc Af Amer: 87 mL/min — ABNORMAL LOW (ref >90)
GFR calc non Af Amer: 75 mL/min — ABNORMAL LOW (ref >90)
GLUCOSE: 122 mg/dL — AB (ref 70–99)
POTASSIUM: 4.1 meq/L (ref 3.7–5.3)
SODIUM: 137 meq/L (ref 137–147)

## 2014-07-18 LAB — MAGNESIUM: Magnesium: 1.5 mg/dL (ref 1.5–2.5)

## 2014-07-18 MED ORDER — METHOCARBAMOL 500 MG PO TABS: 1000.0000 mg | ORAL_TABLET | Freq: Four times a day (QID) | ORAL | Status: DC | PRN

## 2014-07-18 MED ORDER — ACETAMINOPHEN 500 MG PO TABS
1000.0000 mg | ORAL_TABLET | Freq: Four times a day (QID) | ORAL | Status: DC
  Administered 2014-07-18 – 2014-07-21 (×3): 1000 mg via ORAL
  Filled 2014-07-18 (×12): qty 2

## 2014-07-18 MED ORDER — MORPHINE SULFATE 2 MG/ML IJ SOLN
2.0000 mg | INTRAMUSCULAR | Status: DC | PRN
  Administered 2014-07-18 – 2014-07-19 (×3): 2 mg via INTRAVENOUS

## 2014-07-18 MED ORDER — METHOCARBAMOL 1000 MG/10ML IJ SOLN
1000.0000 mg | Freq: Four times a day (QID) | INTRAVENOUS | Status: DC | PRN
  Administered 2014-07-18 (×2): 1000 mg via INTRAVENOUS
  Filled 2014-07-18 (×2): qty 10

## 2014-07-18 MED ORDER — MORPHINE SULFATE 2 MG/ML IJ SOLN
INTRAMUSCULAR | Status: AC
  Filled 2014-07-18: qty 1

## 2014-07-18 MED ORDER — METHOCARBAMOL 1000 MG/10ML IJ SOLN
1000.0000 mg | Freq: Once | INTRAVENOUS | Status: AC
  Filled 2014-07-18: qty 10

## 2014-07-18 MED ORDER — MAGIC MOUTHWASH
15.0000 mL | Freq: Three times a day (TID) | ORAL | Status: AC
  Filled 2014-07-18 (×3): qty 15

## 2014-07-18 MED ORDER — ALUM & MAG HYDROXIDE-SIMETH 200-200-20 MG/5ML PO SUSP
30.0000 mL | Freq: Once | ORAL | Status: AC
  Administered 2014-07-18: 30 mL via ORAL

## 2014-07-18 NOTE — Progress Notes (Signed)
[facility]  [address] [place], [place] [address] Phone: [phone] FAX: [phone]    Eileen Holland [id number] October 21, 1960  CARE TEAM:  PCP: Suzanna Obey, MD  Outpatient Care Team: Patient Care Team: [name], MD as PCP - General (Family Medicine) Ladene Artist, MD as Consulting Physician (Gastroenterology)  Inpatient Treatment Team: Treatment Team: Attending Provider: Michael Boston, MD; Registered Nurse: Buel Ream Debrew, RN   Subjective:  C/o sore throat Feels bloated Walked a little in room Sleeps with Dilaudid but feels like it does not last  Objective:  Vital signs:  Filed Vitals:   07/17/14 2058 07/17/14 2147 07/18/14 0127 07/18/14 0544  BP: 125/78 122/76 132/67 130/68  Pulse: 67 55 61 72  Temp: 97.7 F (36.5 C) 97.7 F (36.5 C) 97.6 F (36.4 C) 97.5 F (36.4 C)  TempSrc: Axillary Oral Oral Oral  Resp: '14 12 14 16  ' Height:      Weight:      SpO2: 100% 100% 96% 94%       Intake/Output   Yesterday:  12/03 0701 - 12/04 0700 In: 4316.3 [I.V.:3066.3; IV Piggyback:1250] Out: 7619 [JKDTO:6712] This shift:  Total I/O In: 841.3 [I.V.:841.3] Out: 1250 [Urine:1250]  Bowel function:  Flatus: n  BM: n  Drain: n/a  Physical Exam:  General: Pt awake/alert/oriented x4 in no acute distress.  Anxious but consolable Eyes: PERRL, normal EOM.  Sclera clear.  No icterus Neuro: CN II-XII intact w/o focal sensory/motor deficits. Lymph: No head/neck/groin lymphadenopathy Psych:  No delerium/psychosis/paranoia HENT: Normocephalic, Mucus membranes moist.  No thrush.  No hoarseness.  Strong voice Neck: Supple, No tracheal deviation Chest: No chest wall pain w good excursion CV:  Pulses intact.  Regular rhythm MS: Normal AROM mjr joints.  No obvious deformity Abdomen: Mostly soft.  Obese.  Mild/mod distended.  Mod tender at incisions only.  No evidence of peritonitis.  No incarcerated hernias. Ext:   SCDs BLE.  No mjr edema.  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 female  1 Day Post-Op  Procedure(s): Cruger  Plan:  -change narcotics.  Inc tylenol.  Add robaxin PRN -liquids only -palliate sore throat -HTN - BP OK on 1/2 dose metoprolol.  Follow -anxiolysis -f/u pathology -VTE prophylaxis- SCDs, etc -mobilize as tolerated to help recovery  I updated the patient's status to the patient.  OR findings noted.  Recommendations were made.  Questions were answered.  The patient expressed understanding & appreciation.  [name], M.D., F.A.C.S. Gastrointestinal and Minimally Invasive Surgery [facility] [address], [place], [place] [address] [phone] Main / Paging   07/18/2014   Results:   Labs: Results for orders placed or performed during the hospital encounter of 07/17/14 (from the past 48 hour(s))  Type and screen     Status: None   Collection Time: 07/17/14 10:15 AM  Result Value Ref Range   ABO/RH(D) O POS    Antibody Screen NEG    Sample Expiration 07/20/2014   ABO/Rh     Status: None   Collection Time: 07/17/14 10:15 AM  Result Value Ref Range   ABO/RH(D) O POS   CBC     Status: Abnormal   Collection Time: 07/17/14  7:05 PM  Result Value Ref Range   WBC 18.0 (H) 4.0 - 10.5 K/uL   RBC 3.41 (L) 3.87 - 5.11 MIL/uL   Hemoglobin 11.9 (L) 12.0 - 15.0 g/dL   HCT 34.8 (L) 36.0 - 46.0 %   MCV 102.1 (H) 78.0 - 100.0 fL   MCH 34.9 (H) 26.0 - 34.0 pg   MCHC 34.2 30.0 - 36.0 g/dL   RDW 11.8 11.5 - 15.5 %   Platelets 235 150 - 400 K/uL  Creatinine, serum     Status: Abnormal   Collection Time: 07/17/14  7:05 PM  Result Value Ref Range   Creatinine, Ser 0.98 0.50 - 1.10 mg/dL   GFR calc non Af  Amer 65 (L) >90 mL/min   GFR calc Af Amer 76 (L) >90 mL/min    Comment: (NOTE) The eGFR has been calculated using the CKD EPI equation. This calculation has not been validated in all clinical situations. eGFR's persistently <90 mL/min signify possible Chronic Kidney Disease.   Basic metabolic panel     Status: Abnormal   Collection Time: 07/18/14  4:22 AM  Result Value Ref Range   Sodium 137 137 - 147 mEq/L   Potassium 4.1 3.7 - 5.3 mEq/L   Chloride 101 96 - 112 mEq/L   CO2 24 19 - 32 mEq/L   Glucose, Bld 122 (H) 70 - 99 mg/dL   BUN 7 6 - 23 mg/dL   Creatinine, Ser 0.87 0.50 - 1.10 mg/dL   Calcium 8.8 8.4 - 10.5 mg/dL   GFR calc non Af Amer 75 (L) >90 mL/min   GFR calc Af Amer 87 (L) >90 mL/min    Comment: (NOTE) The eGFR has been calculated using the CKD EPI equation. This calculation has not been validated in all clinical situations. eGFR's persistently <90 mL/min signify possible Chronic Kidney Disease.    Anion gap 12 5 - 15  CBC     Status: Abnormal   Collection Time: 07/18/14  4:22 AM  Result Value Ref Range   WBC 20.7 (H) 4.0 - 10.5 K/uL   RBC 3.19 (L) 3.87 - 5.11 MIL/uL   Hemoglobin 11.2 (L) 12.0 - 15.0 g/dL   HCT 33.1 (L) 36.0 - 46.0 %   MCV 103.8 (H) 78.0 - 100.0 fL   MCH 35.1 (H) 26.0 - 34.0 pg   MCHC 33.8 30.0 - 36.0 g/dL   RDW 11.7 11.5 - 15.5 %   Platelets 215 150 - 400 K/uL  Magnesium     Status: None   Collection Time: 07/18/14  4:22 AM  Result Value Ref Range   Magnesium 1.5 1.5 - 2.5 mg/dL    Imaging / Studies: No results found.  Medications / Allergies: per chart  Antibiotics: Anti-infectives    Start     Dose/Rate Route Frequency Ordered Stop   07/18/14 0100  cefoTEtan (CEFOTAN) 2 g in dextrose 5 % 50 mL IVPB     2 g100 mL/hr over 30 Minutes Intravenous Every 12 hours 07/17/14 1835 07/18/14 0130   07/17/14 1300  clindamycin (CLEOCIN) 900 mg, gentamicin (GARAMYCIN) 240 mg in sodium chloride 0.9 % 1,000 mL for intraperitoneal lavage       Intraperitoneal To Surgery 07/17/14 1248 07/17/14 1351   07/17/14 1005  cefoTEtan (CEFOTAN) 2 g in dextrose 5 % 50 mL IVPB     2 g100 mL/hr over 30 Minutes Intravenous On call to O.R. 07/17/14 1005 07/17/14 1251       Note: Portions of  this report may have been transcribed using voice recognition software. Every effort was made to ensure accuracy; however, inadvertent computerized transcription errors may be present.   Any transcriptional errors that result from this process are unintentional.

## 2014-07-19 MED ORDER — HYDROCODONE-ACETAMINOPHEN 5-325 MG PO TABS
1.0000 | ORAL_TABLET | ORAL | Status: DC | PRN
  Administered 2014-07-19 – 2014-07-21 (×8): 2 via ORAL
  Filled 2014-07-19 (×7): qty 2

## 2014-07-19 MED ORDER — MORPHINE SULFATE 2 MG/ML IJ SOLN
INTRAMUSCULAR | Status: AC
  Filled 2014-07-19: qty 1

## 2014-07-19 MED ORDER — HYDROCODONE-ACETAMINOPHEN 5-325 MG PO TABS
ORAL_TABLET | ORAL | Status: AC
  Filled 2014-07-19: qty 2

## 2014-07-19 NOTE — Plan of Care (Signed)
Problem: Phase II Progression Outcomes Goal: Pain controlled Outcome: Completed/Met Date Met:  07/19/14 Goal: Progress activity as tolerated unless otherwise ordered Outcome: Completed/Met Date Met:  07/19/14 Goal: Progressing with IS, TCDB Outcome: Completed/Met Date Met:  07/19/14 Goal: Return of bowel function (flatus, BM) IF ABDOMINAL SURGERY:  Outcome: Completed/Met Date Met:  07/19/14 Goal: Foley discontinued Outcome: Not Applicable Date Met:  23/55/73 Goal: Discharge plan established Outcome: Completed/Met Date Met:  07/19/14 Goal: Tolerating diet Outcome: Completed/Met Date Met:  07/19/14

## 2014-07-19 NOTE — Progress Notes (Addendum)
General Surgery Note  LOS: 2 days  POD -  2 Days Post-Op  PCP - Suzanna Obey, MD Fabienne Bruns Fuller Plan  Assessment/Plan: 1.  XI ROBOT ASSISTED LAPAROSCOPIC PROXIMAL RIGHT COLECTOMY - 07/17/2014 Eileen Holland  On Entereg  Had 2 BMs.  On clear liquids, to advance to full liquids.  Moderate pain, will start Vicodin  2.  DVT prophylaxis - SQ Heparin   Active Problems:   Current tobacco use   Adenomatous colon polyp s/p robotic right proximal colectomy 07/17/2014  Subjective:  Doing okay.  Had 2 BM's.  Moderate pain. Objective:   Filed Vitals:   07/19/14 0510  BP: 193/94  Pulse: 87  Temp: 98.8 F (37.1 C)  Resp: 18     Intake/Output from previous day:  12/04 0701 - 12/05 0700 In: 2185.8 [P.O.:720; I.V.:1465.8] Out: 2650 [Urine:2650]  Intake/Output this shift:  Total I/O In: -  Out: 800 [Urine:800]   Physical Exam:   General: WN AA F who is alert and oriented.    HEENT: Normal. Pupils equal. .   Lungs: clear   Abdomen: Mild distention.  Has BS.   Wound: Clean.  Covered   Lab Results:    Recent Labs  07/17/14 1905 07/18/14 0422  WBC 18.0* 20.7*  HGB 11.9* 11.2*  HCT 34.8* 33.1*  PLT 235 215    BMET   Recent Labs  07/17/14 1905 07/18/14 0422  NA  --  137  K  --  4.1  CL  --  101  CO2  --  24  GLUCOSE  --  122*  BUN  --  7  CREATININE 0.98 0.87  CALCIUM  --  8.8    PT/INR  No results for input(s): LABPROT, INR in the last 72 hours.  ABG  No results for input(s): PHART, HCO3 in the last 72 hours.  Invalid input(s): PCO2, PO2   Studies/Results:  No results found.   Anti-infectives:   Anti-infectives    Start     Dose/Rate Route Frequency Ordered Stop   07/18/14 0100  cefoTEtan (CEFOTAN) 2 g in dextrose 5 % 50 mL IVPB     2 g100 mL/hr over 30 Minutes Intravenous Every 12 hours 07/17/14 1835 07/18/14 0130   07/17/14 1300  clindamycin (CLEOCIN) 900 mg, gentamicin (GARAMYCIN) 240 mg in sodium chloride 0.9 % 1,000 mL for intraperitoneal lavage     Intraperitoneal To Surgery 07/17/14 1248 07/17/14 1351   07/17/14 1005  cefoTEtan (CEFOTAN) 2 g in dextrose 5 % 50 mL IVPB     2 g100 mL/hr over 30 Minutes Intravenous On call to O.R. 07/17/14 1005 07/17/14 1251      Alphonsa Overall, MD, FACS Pager: [facility] Office: [phone] 07/19/2014

## 2014-07-19 NOTE — Progress Notes (Signed)
Pharmacy Brief Note - Alvimopan (Entereg)  The standing order set for alvimopan (Entereg) now includes an automatic order to discontinue the drug after the patient has had a bowel movement. The change was approved by the [facility] and the Medical Executive Committee.   This patient has had bowel movements documented by nursing. Therefore, alvimopan has been discontinued. If there are questions, please contact the pharmacy at [phone].   Thank you- Dolly Rias RPh 07/19/2014, 1:35 PM Pager [phone]

## 2014-07-19 NOTE — Plan of Care (Signed)
Problem: Phase III Progression Outcomes Goal: Pain controlled on oral analgesia Outcome: Completed/Met Date Met:  07/19/14 Goal: Voiding independently Outcome: Completed/Met Date Met:  07/19/14 Goal: Nasogastric tube discontinued Outcome: Not Applicable Date Met:  20/10/07

## 2014-07-20 MED ORDER — AMLODIPINE BESYLATE 5 MG PO TABS
5.0000 mg | ORAL_TABLET | Freq: Every day | ORAL | Status: DC
  Administered 2014-07-20 – 2014-07-21 (×2): 5 mg via ORAL
  Filled 2014-07-20 (×2): qty 1

## 2014-07-20 MED ORDER — LOSARTAN POTASSIUM 50 MG PO TABS
100.0000 mg | ORAL_TABLET | Freq: Every day | ORAL | Status: DC
  Administered 2014-07-20 – 2014-07-21 (×2): 100 mg via ORAL
  Filled 2014-07-20 (×2): qty 2

## 2014-07-20 MED ORDER — HYDROCHLOROTHIAZIDE 25 MG PO TABS
25.0000 mg | ORAL_TABLET | Freq: Every day | ORAL | Status: DC
  Administered 2014-07-20 – 2014-07-21 (×2): 25 mg via ORAL
  Filled 2014-07-20 (×2): qty 1

## 2014-07-20 MED ORDER — METOPROLOL SUCCINATE ER 100 MG PO TB24
100.0000 mg | ORAL_TABLET | Freq: Every day | ORAL | Status: DC
  Administered 2014-07-20 – 2014-07-21 (×2): 100 mg via ORAL
  Filled 2014-07-20 (×2): qty 1

## 2014-07-20 NOTE — Plan of Care (Signed)
Problem: Phase III Progression Outcomes Goal: Activity at appropriate level-compared to baseline (UP IN CHAIR FOR HEMODIALYSIS)  Outcome: Completed/Met Date Met:  07/20/14  Problem: Discharge Progression Outcomes Goal: Pain controlled with appropriate interventions Outcome: Completed/Met Date Met:  07/20/14 Goal: Tolerating diet Outcome: Completed/Met Date Met:  07/20/14 Goal: Tubes and drains discontinued if indicated Outcome: Completed/Met Date Met:  07/20/14

## 2014-07-20 NOTE — Progress Notes (Signed)
3 Days Post-Op  Subjective: No complaints  Objective: Vital signs in last 24 hours: Temp:  [97.7 F (36.5 C)-98.1 F (36.7 C)] 98.1 F (36.7 C) (12/06 0552) Pulse Rate:  [55-71] 56 (12/06 0552) Resp:  [18] 18 (12/06 0552) BP: (128-151)/(80-93) 145/86 mmHg (12/06 0552) SpO2:  [98 %-100 %] 98 % (12/06 0552) Weight:  [168 lb 3.4 oz (76.3 kg)] 168 lb 3.4 oz (76.3 kg) (12/06 0500) Last BM Date: 07/19/14  Intake/Output from previous day: 12/05 0701 - 12/06 0700 In: 1175 [I.V.:1175] Out: 2750 [Urine:2750] Intake/Output this shift:    Resp: clear to auscultation bilaterally Cardio: regular rate and rhythm GI: soft, nontender. good bs. incisions look good  Lab Results:   Recent Labs  07/17/14 1905 07/18/14 0422  WBC 18.0* 20.7*  HGB 11.9* 11.2*  HCT 34.8* 33.1*  PLT 235 215   BMET  Recent Labs  07/17/14 1905 07/18/14 0422  NA  --  137  K  --  4.1  CL  --  101  CO2  --  24  GLUCOSE  --  122*  BUN  --  7  CREATININE 0.98 0.87  CALCIUM  --  8.8   PT/INR No results for input(s): LABPROT, INR in the last 72 hours. ABG No results for input(s): PHART, HCO3 in the last 72 hours.  Invalid input(s): PCO2, PO2  Studies/Results: No results found.  Anti-infectives: Anti-infectives    Start     Dose/Rate Route Frequency Ordered Stop   07/18/14 0100  cefoTEtan (CEFOTAN) 2 g in dextrose 5 % 50 mL IVPB     2 g100 mL/hr over 30 Minutes Intravenous Every 12 hours 07/17/14 1835 07/18/14 0130   07/17/14 1300  clindamycin (CLEOCIN) 900 mg, gentamicin (GARAMYCIN) 240 mg in sodium chloride 0.9 % 1,000 mL for intraperitoneal lavage      Intraperitoneal To Surgery 07/17/14 1248 07/17/14 1351   07/17/14 1005  cefoTEtan (CEFOTAN) 2 g in dextrose 5 % 50 mL IVPB     2 g100 mL/hr over 30 Minutes Intravenous On call to O.R. 07/17/14 1005 07/17/14 1251      Assessment/Plan: s/p Procedure(s): XI ROBOT ASSISTED LAPAROSCOPIC PROXIMAL RIGHT COLECTOMY (N/A) Advance diet. Start regular  food Restart home meds for hypertension ambulate  LOS: 3 days    TOTH III,PAUL S 07/20/2014

## 2014-07-20 NOTE — Plan of Care (Signed)
Problem: Phase III Progression Outcomes Goal: Demonstrates TCDB, IS independently Outcome: Completed/Met Date Met:  07/20/14

## 2014-07-21 MED ORDER — ACETAMINOPHEN 500 MG PO TABS: 1000.0000 mg | ORAL_TABLET | Freq: Four times a day (QID) | ORAL | Status: DC

## 2014-07-21 MED ORDER — SACCHAROMYCES BOULARDII 250 MG PO CAPS: ORAL_CAPSULE | ORAL | Status: DC

## 2014-07-21 MED ORDER — HYDROCODONE-ACETAMINOPHEN 5-325 MG PO TABS: 1.0000 | ORAL_TABLET | ORAL | Status: DC | PRN

## 2014-07-21 NOTE — Plan of Care (Signed)
Problem: Phase III Progression Outcomes Goal: IV changed to normal saline lock Outcome: Completed/Met Date Met:  07/21/14 Goal: Discharge plan remains appropriate-arrangements made Outcome: Completed/Met Date Met:  07/21/14

## 2014-07-21 NOTE — Progress Notes (Signed)
4 Days Post-Op  Subjective:  Tolerating diet, and BM, moving well. She would like to go home today. Objective: Vital signs in last 24 hours: Temp:  [98 F (36.7 C)-98.1 F (36.7 C)] 98 F (36.7 C) (12/07 0510) Pulse Rate:  [52-60] 52 (12/07 0510) Resp:  [18] 18 (12/07 0510) BP: (131-166)/(77-84) 131/77 mmHg (12/07 0510) SpO2:  [98 %-100 %] 98 % (12/07 0510) Weight:  [72.122 kg (159 lb)] 72.122 kg (159 lb) (12/07 0415) Last BM Date: 07/20/14 240 PO recorded 1 stool Diet: regular Afebrile, BP up some No labs last WBC was 20.7 Diet: regular   Intake/Output from previous day: 12/06 0701 - 12/07 0700 In: 600 [P.O.:240] Out: 900 [Urine:900] Intake/Output this shift:    General appearance: alert, cooperative and no distress Resp: clear to auscultation bilaterally GI: soft, not really that sore in bed she moves better after pain med. + BS, +BM, port sites look fine.  Lab Results:  No results for input(s): WBC, HGB, HCT, PLT in the last 72 hours.  BMET No results for input(s): NA, K, CL, CO2, GLUCOSE, BUN, CREATININE, CALCIUM in the last 72 hours. PT/INR No results for input(s): LABPROT, INR in the last 72 hours.  No results for input(s): AST, ALT, ALKPHOS, BILITOT, PROT, ALBUMIN in the last 168 hours.   Lipase  No results found for: LIPASE   Studies/Results: No results found.  Medications: . acetaminophen  1,000 mg Oral QID  . amLODipine  5 mg Oral Daily  . fluticasone  1 spray Each Nare Daily  . heparin subcutaneous  5,000 Units Subcutaneous 3 times per day  . hydrochlorothiazide  25 mg Oral Daily  . lip balm  1 application Topical BID  . losartan  100 mg Oral Daily  . metoprolol succinate  100 mg Oral Daily  . multivitamin-prenatal  1 tablet Oral Q1200  . PARoxetine  20 mg Oral Daily  . saccharomyces boulardii  250 mg Oral BID    Prior to Admission medications   Medication Sig Start Date End Date Taking? Authorizing Provider  ALPRAZolam Duanne Moron) 0.5 MG  tablet Take 0.5 mg by mouth 2 (two) times daily as needed for anxiety.   Yes Historical Provider, MD  amLODipine (NORVASC) 10 MG tablet Take 10 mg by mouth every evening. @@7 :30 pm   Yes Historical Provider, MD  B Complex Vitamins (B COMPLEX PO) Take 1 tablet by mouth daily at 12 noon. (B complex-C-E-zinc)   Yes Historical Provider, MD  fluticasone (VERAMYST) 27.5 MCG/SPRAY nasal spray Place 2 sprays into the nose every morning.    Yes Historical Provider, MD  hydrochlorothiazide 25 MG tablet Take 25 mg by mouth every morning.    Yes Historical Provider, MD  levocetirizine (XYZAL) 5 MG tablet Take 5 mg by mouth daily at 12 noon. For allergies    Yes Historical Provider, MD  losartan (COZAAR) 100 MG tablet Take 100 mg by mouth every morning.   Yes Historical Provider, MD  metoprolol succinate (TOPROL-XL) 100 MG 24 hr tablet Take 100 mg by mouth every morning. Take with or immediately following a meal.   Yes Historical Provider, MD  PARoxetine (PAXIL) 20 MG tablet Take 20 mg by mouth every morning.     Yes Historical Provider, MD  Prenatal Vit-Fe Fumarate-FA (PRENATAL PO) Take 1 tablet by mouth daily at 12 noon.   Yes Historical Provider, MD  ACETAMINOPHEN-BUTALBITAL 50-325 MG TABS Take 1 tablet by mouth every 4 (four) hours as needed (headache.).  Historical Provider, MD  ketoconazole (NIZORAL) 2 % cream Apply 1 application topically daily as needed for irritation.    Historical Provider, MD  oxyCODONE (OXY IR/ROXICODONE) 5 MG immediate release tablet Take 1-2 tablets (5-10 mg total) by mouth every 4 (four) hours as needed for moderate pain, severe pain or breakthrough pain. 07/17/14   Michael Boston, MD     Assessment/Plan 1. XI ROBOT ASSISTED LAPAROSCOPIC PROXIMAL RIGHT COLECTOMY - 07/17/2014 - Gross 2. DVT prophylaxis - SQ Heparin 3.  Hypertension 4.  Migraines 5.  Depression   Plan:  Home after breakfast    LOS: 4 days    Sonji Starkes 07/21/2014

## 2014-07-23 NOTE — Discharge Summary (Signed)
Physician Discharge Summary  Patient ID: Eileen Holland MRN: [id number] DOB/AGE: 03-31-1961 53 y.o.  Admit date: 07/17/2014 Discharge date: 07/21/2014  Patient Care Team: [name], MD as PCP - General (Family Medicine) Ladene Artist, MD as Consulting Physician (Gastroenterology)  Admission Diagnoses: Active Problems:   Current tobacco use   Adenomatous colon polyp s/p robotic right proximal colectomy 07/17/2014   Discharge Diagnoses:  Active Problems:   Current tobacco use   Adenomatous colon polyp s/p robotic right proximal colectomy 07/17/2014   POST-OPERATIVE DIAGNOSIS:  cecal colon polyp  SURGERY:  Procedure(s): XI ROBOT ASSISTED LAPAROSCOPIC PROXIMAL RIGHT COLECTOMY  SURGEON:  Surgeon(s): Michael Boston, MD  Consults: None  Hospital Course:   The patient underwent colectomy surgery above.  Postoperatively, the patient gradually mobilized and advanced to a solid diet.  Pain and other symptoms were treated aggressively.    By the time of discharge, the patient was walking well the hallways, eating food, having flatus.  Pain was well-controlled on an oral medications.  Based on meeting discharge criteria and continuing to recover, I felt it was safe for the patient to be discharged from the hospital to further recover with close followup. Postoperative recommendations were discussed in detail.  They are written as well.   Significant Diagnostic Studies:  No results found for this or any previous visit (from the past 72 hour(s)).  No results found.  Discharge Exam: Blood pressure 131/77, pulse 52, temperature 98 F (36.7 C), temperature source Oral, resp. rate 18, height 5' 2.5" (1.588 m), weight 159 lb (72.122 kg), last menstrual period 02/21/2011, SpO2 98 %.  General: Pt awake/alert/oriented x4 in no major acute distress Eyes: PERRL, normal EOM. Sclera nonicteric Neuro: CN II-XII intact w/o focal sensory/motor deficits. Lymph: No head/neck/groin  lymphadenopathy Psych:  No delerium/psychosis/paranoia HENT: Normocephalic, Mucus membranes moist.  No thrush Neck: Supple, No tracheal deviation Chest: No pain.  Good respiratory excursion. CV:  Pulses intact.  Regular rhythm MS: Normal AROM mjr joints.  No obvious deformity Abdomen: Soft, Nondistended.  Min tender.  No incarcerated hernias. Ext:  SCDs BLE.  No significant edema.  No cyanosis Skin: No petechiae / purpura  Discharged Condition: good   Past Medical History  Diagnosis Date  . Hypertension   . Depression   . Allergy   . Headache     hx of per PCP history  . Anxiety     panic attacks per PCP history  . Headache, migraine 07/04/2014    Last Assessment & Plan:  Refilled butalbital for rare use, once monthly as cannot take NSAIDs.  Advised if needs it more regularly, to follow-up and discuss management furtehr     Past Surgical History  Procedure Laterality Date  . Knee arthroscopy Right 2000  . Vaginal hysterectomy  2012  . Colonscopy       History   Social History  . Marital Status: Single    Spouse Name: N/A    Number of Children: N/A  . Years of Education: N/A   Occupational History  . Not on file.   Social History Main Topics  . Smoking status: Current Every Day Smoker -- 0.50 packs/day for 30 years    Types: Cigarettes  . Smokeless tobacco: Never Used  . Alcohol Use: Yes     Comment: occas  . Drug Use: No  . Sexual Activity: Not on file   Other Topics Concern  . Not on file   Social History Narrative    Family History  Problem Relation Age of Onset  . Colon cancer Neg Hx     No current facility-administered medications for this encounter.   Current Outpatient Prescriptions  Medication Sig Dispense Refill  . ALPRAZolam (XANAX) 0.5 MG tablet Take 0.5 mg by mouth 2 (two) times daily as needed for anxiety.    Marland Kitchen amLODipine (NORVASC) 10 MG tablet Take 10 mg by mouth every evening. @@7 :30 pm    . B Complex Vitamins (B COMPLEX PO) Take 1  tablet by mouth daily at 12 noon. (B complex-C-E-zinc)    . fluticasone (VERAMYST) 27.5 MCG/SPRAY nasal spray Place 2 sprays into the nose every morning.     . hydrochlorothiazide 25 MG tablet Take 25 mg by mouth every morning.     Marland Kitchen levocetirizine (XYZAL) 5 MG tablet Take 5 mg by mouth daily at 12 noon. For allergies     . losartan (COZAAR) 100 MG tablet Take 100 mg by mouth every morning.    . metoprolol succinate (TOPROL-XL) 100 MG 24 hr tablet Take 100 mg by mouth every morning. Take with or immediately following a meal.    . PARoxetine (PAXIL) 20 MG tablet Take 20 mg by mouth every morning.      . Prenatal Vit-Fe Fumarate-FA (PRENATAL PO) Take 1 tablet by mouth daily at 12 noon.    Marland Kitchen acetaminophen (TYLENOL) 500 MG tablet Take 2 tablets (1,000 mg total) by mouth 4 (four) times daily. 30 tablet 0  . ACETAMINOPHEN-BUTALBITAL 50-325 MG TABS Take 1 tablet by mouth every 4 (four) hours as needed (headache.).    . HYDROcodone-acetaminophen (NORCO/VICODIN) 5-325 MG per tablet Take 1-2 tablets by mouth every 4 (four) hours as needed for moderate pain. 40 tablet 0  . ketoconazole (NIZORAL) 2 % cream Apply 1 application topically daily as needed for irritation.    Marland Kitchen oxyCODONE (OXY IR/ROXICODONE) 5 MG immediate release tablet Take 1-2 tablets (5-10 mg total) by mouth every 4 (four) hours as needed for moderate pain, severe pain or breakthrough pain. 40 tablet 0  . saccharomyces boulardii (FLORASTOR) 250 MG capsule You can buy this over the counter and take till you follow up with Dr. Johney Maine       Allergies  Allergen Reactions  . Aspirin Hives  . Ibuprofen Hives    Disposition: 01-Home or Self Care  Discharge Instructions    Call MD for:  extreme fatigue    Complete by:  As directed      Call MD for:  hives    Complete by:  As directed      Call MD for:  persistant nausea and vomiting    Complete by:  As directed      Call MD for:  redness, tenderness, or signs of infection (pain, swelling,  redness, odor or green/yellow discharge around incision site)    Complete by:  As directed      Call MD for:  severe uncontrolled pain    Complete by:  As directed      Call MD for:    Complete by:  As directed   Temperature > 101.22F     Diet - low sodium heart healthy    Complete by:  As directed      Discharge instructions    Complete by:  As directed   Please see discharge instruction sheets.  Also refer to handout given an office.  Please call our office if you have any questions or concerns [phone]     Discharge wound  care:    Complete by:  As directed   If you have closed incisions, shower and bathe over these incisions with soap and water every day.  Remove all surgical dressings on postoperative day #3.  You do not need to replace dressings over the closed incisions unless you feel more comfortable with a Band-Aid covering it.   If you have an open wound that requires packing, please see wound care instructions.  In general, remove all dressings, wash wound with soap and water and then replace with saline moistened gauze.  Do the dressing change at least every day.  Please call our office [phone] if you have further questions.     Driving Restrictions    Complete by:  As directed   No driving until off narcotics and can safely swerve away without pain during an emergency     Increase activity slowly    Complete by:  As directed   Walk an hour a day.  Use 20-30 minute walks.  When you can walk 30 minutes without difficulty, increase to low impact/moderate activities such as biking, jogging, swimming, sexual activity..  Eventually can increase to unrestricted activity when not feeling pain.  If you feel pain: STOP!Marland Kitchen   Let pain protect you from overdoing it.  Use ice/heat/over-the-counter pain medications to help minimize his soreness.  Use pain prescriptions as needed to remain active.  It is better to take extra pain medications and be more active than to stay bedridden to  avoid all pain medications.     Lifting restrictions    Complete by:  As directed   Avoid heavy lifting initially.  Do not push through pain.  You have no specific weight limit.  Coughing and sneezing or four more stressful to your incision than any lifting you will do. Pain will protect you from injury.  Therefore, avoid intense activity until off all narcotic pain medications.  Coughing and sneezing or four more stressful to your incision than any lifting he will do.     May shower / Bathe    Complete by:  As directed      May walk up steps    Complete by:  As directed      Sexual Activity Restrictions    Complete by:  As directed   Sexual activity as tolerated.  Do not push through pain.  Pain will protect you from injury.     Walk with assistance    Complete by:  As directed   Walk over an hour a day.  May use a walker/cane/companion to help with balance and stamina.            Medication List    TAKE these medications        acetaminophen 500 MG tablet  Commonly known as:  TYLENOL  Take 2 tablets (1,000 mg total) by mouth 4 (four) times daily.     ACETAMINOPHEN-BUTALBITAL 50-325 MG Tabs  Take 1 tablet by mouth every 4 (four) hours as needed (headache.).     ALPRAZolam 0.5 MG tablet  Commonly known as:  XANAX  Take 0.5 mg by mouth 2 (two) times daily as needed for anxiety.     amLODipine 10 MG tablet  Commonly known as:  NORVASC  Take 10 mg by mouth every evening. @@7 :30 pm     B COMPLEX PO  Take 1 tablet by mouth daily at 12 noon. (B complex-C-E-zinc)     fluticasone 27.5 MCG/SPRAY nasal spray  Commonly known  asSanjuana Kava  Place 2 sprays into the nose every morning.     hydrochlorothiazide 25 MG tablet  Commonly known as:  HYDRODIURIL  Take 25 mg by mouth every morning.     HYDROcodone-acetaminophen 5-325 MG per tablet  Commonly known as:  NORCO/VICODIN  Take 1-2 tablets by mouth every 4 (four) hours as needed for moderate pain.     ketoconazole 2 % cream   Commonly known as:  NIZORAL  Apply 1 application topically daily as needed for irritation.     levocetirizine 5 MG tablet  Commonly known as:  XYZAL  - Take 5 mg by mouth daily at 12 noon. For allergies  -      losartan 100 MG tablet  Commonly known as:  COZAAR  Take 100 mg by mouth every morning.     metoprolol succinate 100 MG 24 hr tablet  Commonly known as:  TOPROL-XL  Take 100 mg by mouth every morning. Take with or immediately following a meal.     oxyCODONE 5 MG immediate release tablet  Commonly known as:  Oxy IR/ROXICODONE  Take 1-2 tablets (5-10 mg total) by mouth every 4 (four) hours as needed for moderate pain, severe pain or breakthrough pain.     PARoxetine 20 MG tablet  Commonly known as:  PAXIL  Take 20 mg by mouth every morning.     PRENATAL PO  Take 1 tablet by mouth daily at 12 noon.     saccharomyces boulardii 250 MG capsule  Commonly known as:  FLORASTOR  You can buy this over the counter and take till you follow up with Dr. Johney Maine           Follow-up Information    Follow up with Toneshia Coello C., MD In 2 weeks.   Specialty:  General Surgery   Contact information:   [address] [place] [address] [phone]       Follow up with Suzanna Obey, MD.   Specialty:  Family Medicine   Why:  Let them know you has surgery so they can follow up on Medical issues.   Contact information:   [address] [place] [address] [phone]        Signed: Morton Peters, M.D., F.A.C.S. Gastrointestinal and Minimally Invasive Surgery [facility] [address] [place], [place] [address] [phone] Main / Paging   07/23/2014, 11:38 AM

## 2014-08-15 DIAGNOSIS — Z923 Personal history of irradiation: Secondary | ICD-10-CM

## 2014-08-15 HISTORY — DX: Personal history of irradiation: Z92.3

## 2014-08-20 ENCOUNTER — Other Ambulatory Visit (INDEPENDENT_AMBULATORY_CARE_PROVIDER_SITE_OTHER): Payer: Self-pay | Admitting: Surgery

## 2015-03-20 ENCOUNTER — Other Ambulatory Visit: Payer: Self-pay

## 2015-03-20 DIAGNOSIS — K635 Polyp of colon: Secondary | ICD-10-CM | POA: Insufficient documentation

## 2015-03-20 DIAGNOSIS — J309 Allergic rhinitis, unspecified: Secondary | ICD-10-CM | POA: Insufficient documentation

## 2015-03-23 ENCOUNTER — Other Ambulatory Visit: Payer: Self-pay

## 2015-03-23 DIAGNOSIS — Z1231 Encounter for screening mammogram for malignant neoplasm of breast: Secondary | ICD-10-CM

## 2015-04-13 ENCOUNTER — Ambulatory Visit
Admission: RE | Admit: 2015-04-13 | Discharge: 2015-04-13 | Disposition: A | Discharge status: Home / Self Care | Payer: Managed Care, Other (non HMO) | Source: Ambulatory Visit

## 2015-04-13 ENCOUNTER — Ambulatory Visit: Payer: Managed Care, Other (non HMO)

## 2015-04-13 DIAGNOSIS — Z1231 Encounter for screening mammogram for malignant neoplasm of breast: Secondary | ICD-10-CM

## 2015-04-15 DIAGNOSIS — D0510 Intraductal carcinoma in situ of unspecified breast: Secondary | ICD-10-CM | POA: Insufficient documentation

## 2015-04-16 ENCOUNTER — Other Ambulatory Visit: Payer: Self-pay | Admitting: Family Medicine

## 2015-04-16 DIAGNOSIS — R928 Other abnormal and inconclusive findings on diagnostic imaging of breast: Secondary | ICD-10-CM

## 2015-04-23 ENCOUNTER — Other Ambulatory Visit: Payer: Self-pay | Admitting: Family Medicine

## 2015-04-23 ENCOUNTER — Ambulatory Visit
Admission: RE | Admit: 2015-04-23 | Discharge: 2015-04-23 | Disposition: A | Discharge status: Home / Self Care | Payer: Managed Care, Other (non HMO) | Source: Ambulatory Visit | Attending: Family Medicine | Admitting: Family Medicine

## 2015-04-23 DIAGNOSIS — R928 Other abnormal and inconclusive findings on diagnostic imaging of breast: Secondary | ICD-10-CM

## 2015-04-28 ENCOUNTER — Ambulatory Visit
Admission: RE | Admit: 2015-04-28 | Discharge: 2015-04-28 | Disposition: A | Discharge status: Home / Self Care | Payer: Managed Care, Other (non HMO) | Source: Ambulatory Visit | Attending: Family Medicine | Admitting: Family Medicine

## 2015-04-28 DIAGNOSIS — R928 Other abnormal and inconclusive findings on diagnostic imaging of breast: Secondary | ICD-10-CM

## 2015-04-28 HISTORY — PX: BREAST BIOPSY: SHX20

## 2015-04-29 ENCOUNTER — Telehealth: Payer: Self-pay

## 2015-04-30 NOTE — Telephone Encounter (Signed)
Results

## 2015-05-11 ENCOUNTER — Ambulatory Visit: Payer: Self-pay | Admitting: Surgery

## 2015-05-11 DIAGNOSIS — R921 Mammographic calcification found on diagnostic imaging of breast: Secondary | ICD-10-CM

## 2015-05-11 NOTE — H&P (Signed)
Eileen Holland 05/11/2015 1:41 PM Location: [facility] Patient #: [id number] DOB: [birth year] Single / Language: Cleophus Holland / Race: Black or African American Female History of Present Illness [name] MD; 05/11/2015 2:15 PM) Patient words: breast f/u   Pt sent at the request of Dr Radford Pax for right breast microcalcifications. She underwent core biopsy and the results of fibrocystic disease felt to be discordant with the imaging. She denies any breas pain nipple discharge or mass. Has sister with breast cancer.         CLINICAL DATA: Stereotactic biopsy 3D technique was attempted of calcifications in the far posterior upper outer right breast. EXAM: DIAGNOSTIC RIGHT MAMMOGRAM POST STEREOTACTIC BIOPSY COMPARISON: Previous exam(s). FINDINGS: Mammographic images were obtained following stereotactic guided biopsy of far posterior right breast calcifications. The coil shaped biopsy clip migrated. It should not be used for localization purposes. It is in the upper central right breast approximately 5 cm medial to the calcifications. IMPRESSION: Clip migration. The coil shaped biopsy clip should not be used for localization purposes. The patient left our office before I could give her these results in person. Final Assessment: Post Procedure Mammograms for Marker Placement Electronically Signed By: Curlene Dolphin M.D. On: 04/28/2015 16:15        Diagnosis Breast, right, needle core biopsy, far posterior, near pectoralis muscle, UOQ - FIBROCYSTIC CHANGES WITH CALCIFICATIONS - NO MALIGNANCY IDENTIFIED. - SEE MICROSCOPIC DESCRIPTION  Pathology reveals Right breast fibrocystic changes with calcifications. This was found to be discordant by Dr. Curlene Dolphin. Pathology results were discussed with the patient via telephone. The patient reported no problems with the biopsy site and is doing well. Post biopsy care and instructions were reviewed and questions  were answered. The patient was encouraged to call [facility] with any additional questions and or concerns. A surgical referral was arranged with Dr. Erroll Luna of [facility] on May 11, 2015. Pathology results reported by Terie Purser RN on April 29, 2015. Electronically Signed   Suspicious microcalcifications in the far posterior upper outer right breast, for which biopsy was recommended.  EXAM: RIGHT BREAST STEREOTACTIC CORE NEEDLE BIOPSY WITH 3D TECHNIQUE  COMPARISON: Previous exams.  FINDINGS: The patient and I discussed the procedure of stereotactic-guided biopsy including benefits and alternatives. We discussed the high likelihood of a successful procedure. We discussed the risks of the procedure including infection, bleeding, tissue injury, clip migration, and inadequate sampling. Informed written consent was given. The usual time out protocol was performed immediately prior to the procedure.  Using sterile technique and 2% Lidocaine as local anesthetic, under stereotactic guidance, a 9 gauge vacuum assisted device was used to perform core needle biopsy of calcifications in the deep upper outer left breast using a lateral to medial approach. The biopsy is technically challenging, as the majority of the calcifications project over the pectoralis muscle, with positioning in the CC and LM position. Therefore, in an attempt to avoid the pectoralis muscle, a few faint calcifications just anterior to the more prominent group of calcifications at the pectoralis muscle work targeted. Specimen radiograph was performed showing probably 2 tiny punctate calcifications. Specimens with calcifications are identified for pathology.  At the conclusion of the procedure, a coil shaped tissue marker clip was deployed into the biopsy cavity. Follow-up 2-view mammogram was performed and dictated  separately.  IMPRESSION: Stereotactic-guided biopsy of the right breast. Technically challenging due to the far posterior position of the majority of the calcifications, which projected over the  pectoralis muscle in both the CC and lateral to medial projections. No apparent complications.  Electronically Signed: By: Curlene Dolphin M.D. On: 04/28/2015 16:11.  The patient is a 54 year old female   Allergies [name], CMA; 05/11/2015 1:41 PM) Aspirin *ANALGESICS - NonNarcotic* Ibuprofen *ANALGESICS - ANTI-INFLAMMATORY*  Medication History ([name], CMA; 05/11/2015 1:42 PM) AmLODIPine Besylate (5MG  Tablet, Oral daily) Active. Cyclobenzaprine HCl (10MG  Tablet, Oral) Active. Hydrochlorothiazide (25MG  Tablet, Oral daily) Active. Fluticasone Propionate (50MCG/ACT Suspension, Nasal as needed) Active. Levocetirizine Dihydrochloride (5MG  Tablet, Oral daily) Active. Metoprolol Succinate ER (100MG  Tablet ER 24HR, Oral daily) Active. Losartan Potassium (100MG  Tablet, Oral daily) Active. PARoxetine HCl (20MG  Tablet, Oral daily) Active. Topiramate (50MG  Tablet, Oral daily) Active. Medications Reconciled    Vitals ([name] CMA; 05/11/2015 1:41 PM) 05/11/2015 1:41 PM Weight: 154 lb Height: 62in Body Surface Area: 1.75 m Body Mass Index: 28.17 kg/m Temp.: 97.62F(Temporal)  Pulse: 81 (Regular)  BP: 134/76 (Sitting, Left Arm, Standard)     Physical Exam (Thomas A. Cornett MD; 05/11/2015 2:13 PM)  General Mental Status-Alert. General Appearance-Consistent with stated age. Hydration-Well hydrated. Voice-Normal.  Head and Neck Head-normocephalic, atraumatic with no lesions or palpable masses. Trachea-midline. Thyroid Gland Characteristics - normal size and consistency.  Eye Eyeball - Bilateral-Extraocular movements intact. Sclera/Conjunctiva - Bilateral-No scleral icterus.  Chest and Lung Exam Chest and lung exam reveals -quiet,  even and easy respiratory effort with no use of accessory muscles and on auscultation, normal breath sounds, no adventitious sounds and normal vocal resonance. Inspection Chest Wall - Normal. Back - normal.  Breast Breast - Left-Symmetric, Non Tender, No Biopsy scars, no Dimpling, No Inflammation, No Lumpectomy scars, No Mastectomy scars, No Peau d' Orange. Breast - Right-Symmetric, Non Tender, No Biopsy scars, no Dimpling, No Inflammation, No Lumpectomy scars, No Mastectomy scars, No Peau d' Orange. Breast Lump-No Palpable Breast Mass.  Cardiovascular Cardiovascular examination reveals -normal heart sounds, regular rate and rhythm with no murmurs and normal pedal pulses bilaterally.  Abdomen Inspection Inspection of the abdomen reveals - No Hernias. Skin - Scar - no surgical scars. Palpation/Percussion Palpation and Percussion of the abdomen reveal - Soft, Non Tender, No Rebound tenderness, No Rigidity (guarding) and No hepatosplenomegaly. Auscultation Auscultation of the abdomen reveals - Bowel sounds normal.  Neurologic Neurologic evaluation reveals -alert and oriented x 3 with no impairment of recent or remote memory. Mental Status-Normal.  Musculoskeletal Normal Exam - Left-Upper Extremity Strength Normal and Lower Extremity Strength Normal. Normal Exam - Right-Upper Extremity Strength Normal and Lower Extremity Strength Normal.  Lymphatic Head & Neck  General Head & Neck Lymphatics: Bilateral - Description - Normal. Axillary  General Axillary Region: Bilateral - Description - Normal. Tenderness - Non Tender. Femoral & Inguinal  Generalized Femoral & Inguinal Lymphatics: Bilateral - Description - Normal. Tenderness - Non Tender.    Assessment & Plan (Thomas A. Cornett MD; 05/11/2015 2:16 PM)  MICROCALCIFICATION OF RIGHT BREAST ON MAMMOGRAM (R92.0) Impression: biopsy discordant with pathology recommend right breast seed localized lumpectomy for  diagnosis Risk of lumpectomy include bleeding, infection, seroma, more surgery, use of seed/wire, wound care, cosmetic deformity and the need for other treatments, death , blood clots, death. Pt agrees to proceed.  Current Plans Pt Education - Breast biopsy: discussed with patient and provided information. Pt Education - Breast conserving therapy: discussed with patient and provided information. Pt Education - Overview of benign breast disease: discussed with patient and provided information. Pt Education - CCS Breast Biopsy HCI: discussed with patient and provided  information.   The anatomy and the physiology was discussed. The pathophysiology and natural history of the disease was discussed. Options were discussed and recommendations were made. Technique, risks, benefits, & alternatives were discussed. Risks such as stroke, heart attack, bleeding, indection, death, and other risks discussed. Questions answered. The patient agrees to proceed.

## 2015-05-21 ENCOUNTER — Other Ambulatory Visit: Payer: Self-pay | Admitting: Surgery

## 2015-05-21 DIAGNOSIS — R921 Mammographic calcification found on diagnostic imaging of breast: Secondary | ICD-10-CM

## 2015-06-02 ENCOUNTER — Encounter (HOSPITAL_BASED_OUTPATIENT_CLINIC_OR_DEPARTMENT_OTHER): Payer: Self-pay | Admitting: *Deleted

## 2015-06-04 ENCOUNTER — Other Ambulatory Visit: Payer: Self-pay

## 2015-06-04 ENCOUNTER — Encounter (HOSPITAL_BASED_OUTPATIENT_CLINIC_OR_DEPARTMENT_OTHER)
Admission: RE | Admit: 2015-06-04 | Discharge: 2015-06-04 | Disposition: A | Discharge status: Home / Self Care | Payer: Managed Care, Other (non HMO) | Source: Ambulatory Visit | Attending: Surgery | Admitting: Surgery

## 2015-06-04 DIAGNOSIS — F17200 Nicotine dependence, unspecified, uncomplicated: Secondary | ICD-10-CM | POA: Diagnosis not present

## 2015-06-04 DIAGNOSIS — D0511 Intraductal carcinoma in situ of right breast: Secondary | ICD-10-CM | POA: Diagnosis not present

## 2015-06-04 DIAGNOSIS — I10 Essential (primary) hypertension: Secondary | ICD-10-CM | POA: Diagnosis not present

## 2015-06-04 DIAGNOSIS — R920 Mammographic microcalcification found on diagnostic imaging of breast: Secondary | ICD-10-CM | POA: Diagnosis present

## 2015-06-05 ENCOUNTER — Ambulatory Visit
Admission: RE | Admit: 2015-06-05 | Discharge: 2015-06-05 | Disposition: A | Discharge status: Home / Self Care | Payer: Managed Care, Other (non HMO) | Source: Ambulatory Visit | Attending: Surgery | Admitting: Surgery

## 2015-06-05 DIAGNOSIS — R921 Mammographic calcification found on diagnostic imaging of breast: Secondary | ICD-10-CM

## 2015-06-09 ENCOUNTER — Ambulatory Visit (HOSPITAL_BASED_OUTPATIENT_CLINIC_OR_DEPARTMENT_OTHER): Payer: Managed Care, Other (non HMO) | Admitting: Anesthesiology

## 2015-06-09 ENCOUNTER — Encounter (HOSPITAL_BASED_OUTPATIENT_CLINIC_OR_DEPARTMENT_OTHER): Payer: Self-pay | Admitting: *Deleted

## 2015-06-09 ENCOUNTER — Encounter (HOSPITAL_BASED_OUTPATIENT_CLINIC_OR_DEPARTMENT_OTHER)
Admission: RE | Disposition: A | Discharge status: Home / Self Care | Payer: Self-pay | Source: Ambulatory Visit | Attending: Surgery

## 2015-06-09 ENCOUNTER — Ambulatory Visit
Admission: RE | Admit: 2015-06-09 | Discharge: 2015-06-09 | Disposition: A | Discharge status: Home / Self Care | Payer: Managed Care, Other (non HMO) | Source: Ambulatory Visit | Attending: Surgery | Admitting: Surgery

## 2015-06-09 ENCOUNTER — Ambulatory Visit (HOSPITAL_BASED_OUTPATIENT_CLINIC_OR_DEPARTMENT_OTHER)
Admission: RE | Admit: 2015-06-09 | Discharge: 2015-06-09 | Disposition: A | Discharge status: Home / Self Care | Payer: Managed Care, Other (non HMO) | Source: Ambulatory Visit | Attending: Surgery | Admitting: Surgery

## 2015-06-09 DIAGNOSIS — I10 Essential (primary) hypertension: Secondary | ICD-10-CM | POA: Insufficient documentation

## 2015-06-09 DIAGNOSIS — F17200 Nicotine dependence, unspecified, uncomplicated: Secondary | ICD-10-CM | POA: Insufficient documentation

## 2015-06-09 DIAGNOSIS — D0511 Intraductal carcinoma in situ of right breast: Secondary | ICD-10-CM | POA: Diagnosis not present

## 2015-06-09 DIAGNOSIS — R921 Mammographic calcification found on diagnostic imaging of breast: Secondary | ICD-10-CM

## 2015-06-09 HISTORY — PX: BREAST LUMPECTOMY WITH RADIOACTIVE SEED LOCALIZATION: SHX6424

## 2015-06-09 HISTORY — DX: Mammographic calcification found on diagnostic imaging of breast: R92.1

## 2015-06-09 HISTORY — PX: BREAST LUMPECTOMY: SHX2

## 2015-06-09 SURGERY — BREAST LUMPECTOMY WITH RADIOACTIVE SEED LOCALIZATION
Anesthesia: General | Site: Breast | Laterality: Right

## 2015-06-09 MED ORDER — LIDOCAINE HCL (CARDIAC) 20 MG/ML IV SOLN
INTRAVENOUS | Status: DC | PRN
  Administered 2015-06-09: 50 mg via INTRAVENOUS

## 2015-06-09 MED ORDER — GLYCOPYRROLATE 0.2 MG/ML IJ SOLN
0.2000 mg | Freq: Once | INTRAMUSCULAR | Status: DC | PRN
Start: 2015-06-09 — End: 2015-06-09

## 2015-06-09 MED ORDER — FENTANYL CITRATE (PF) 100 MCG/2ML IJ SOLN
INTRAMUSCULAR | Status: AC
  Filled 2015-06-09: qty 4

## 2015-06-09 MED ORDER — LACTATED RINGERS IV SOLN
INTRAVENOUS | Status: DC | PRN
  Administered 2015-06-09 (×2): via INTRAVENOUS

## 2015-06-09 MED ORDER — MIDAZOLAM HCL 2 MG/2ML IJ SOLN
INTRAMUSCULAR | Status: AC
  Filled 2015-06-09: qty 4

## 2015-06-09 MED ORDER — CEFAZOLIN SODIUM-DEXTROSE 2-3 GM-% IV SOLR
INTRAVENOUS | Status: AC
  Filled 2015-06-09: qty 50

## 2015-06-09 MED ORDER — SUCCINYLCHOLINE CHLORIDE 20 MG/ML IJ SOLN
INTRAMUSCULAR | Status: AC
  Filled 2015-06-09: qty 1

## 2015-06-09 MED ORDER — SODIUM CHLORIDE 0.9 % IR SOLN
Status: DC | PRN
  Administered 2015-06-09: 200 mL

## 2015-06-09 MED ORDER — GLYCOPYRROLATE 0.2 MG/ML IJ SOLN
INTRAMUSCULAR | Status: DC | PRN
  Administered 2015-06-09: 0.2 mg via INTRAVENOUS

## 2015-06-09 MED ORDER — OXYCODONE-ACETAMINOPHEN 5-325 MG PO TABS
ORAL_TABLET | ORAL | Status: AC
  Filled 2015-06-09: qty 1

## 2015-06-09 MED ORDER — ONDANSETRON HCL 4 MG/2ML IJ SOLN
INTRAMUSCULAR | Status: AC
  Filled 2015-06-09: qty 2

## 2015-06-09 MED ORDER — GLYCOPYRROLATE 0.2 MG/ML IJ SOLN
INTRAMUSCULAR | Status: AC
  Filled 2015-06-09: qty 1

## 2015-06-09 MED ORDER — BUPIVACAINE-EPINEPHRINE (PF) 0.25% -1:200000 IJ SOLN
INTRAMUSCULAR | Status: DC | PRN
  Administered 2015-06-09: 10 mL

## 2015-06-09 MED ORDER — DEXAMETHASONE SODIUM PHOSPHATE 4 MG/ML IJ SOLN
INTRAMUSCULAR | Status: DC | PRN
  Administered 2015-06-09: 10 mg via INTRAVENOUS

## 2015-06-09 MED ORDER — BUPIVACAINE-EPINEPHRINE (PF) 0.5% -1:200000 IJ SOLN
INTRAMUSCULAR | Status: AC
  Filled 2015-06-09: qty 30

## 2015-06-09 MED ORDER — CHLORHEXIDINE GLUCONATE 4 % EX LIQD: 1.0000 "application " | Freq: Once | CUTANEOUS | Status: DC

## 2015-06-09 MED ORDER — FENTANYL CITRATE (PF) 100 MCG/2ML IJ SOLN
INTRAMUSCULAR | Status: DC | PRN
  Administered 2015-06-09: 100 ug via INTRAVENOUS

## 2015-06-09 MED ORDER — OXYCODONE-ACETAMINOPHEN 5-325 MG PO TABS: 1.0000 | ORAL_TABLET | ORAL | Status: DC | PRN

## 2015-06-09 MED ORDER — FENTANYL CITRATE (PF) 100 MCG/2ML IJ SOLN: 50.0000 ug | INTRAMUSCULAR | Status: DC | PRN

## 2015-06-09 MED ORDER — PROPOFOL 10 MG/ML IV BOLUS
INTRAVENOUS | Status: DC | PRN
  Administered 2015-06-09: 200 mg via INTRAVENOUS

## 2015-06-09 MED ORDER — SCOPOLAMINE 1 MG/3DAYS TD PT72: 1.0000 | MEDICATED_PATCH | Freq: Once | TRANSDERMAL | Status: DC | PRN

## 2015-06-09 MED ORDER — EPHEDRINE SULFATE 50 MG/ML IJ SOLN
INTRAMUSCULAR | Status: DC | PRN
  Administered 2015-06-09: 10 mg via INTRAVENOUS

## 2015-06-09 MED ORDER — DEXTROSE 5 % IV SOLN: 3.0000 g | INTRAVENOUS | Status: DC

## 2015-06-09 MED ORDER — LACTATED RINGERS IV SOLN
INTRAVENOUS | Status: DC
  Administered 2015-06-09: 13:00:00 via INTRAVENOUS

## 2015-06-09 MED ORDER — OXYCODONE-ACETAMINOPHEN 5-325 MG PO TABS
1.0000 | ORAL_TABLET | Freq: Once | ORAL | Status: AC
  Administered 2015-06-09: 1 via ORAL

## 2015-06-09 MED ORDER — DEXAMETHASONE SODIUM PHOSPHATE 4 MG/ML IJ SOLN: INTRAMUSCULAR | Status: DC | PRN

## 2015-06-09 MED ORDER — PROPOFOL 10 MG/ML IV BOLUS
INTRAVENOUS | Status: AC
  Filled 2015-06-09: qty 20

## 2015-06-09 MED ORDER — MIDAZOLAM HCL 2 MG/2ML IJ SOLN: 1.0000 mg | INTRAMUSCULAR | Status: DC | PRN

## 2015-06-09 MED ORDER — DEXAMETHASONE SODIUM PHOSPHATE 10 MG/ML IJ SOLN
INTRAMUSCULAR | Status: AC
  Filled 2015-06-09: qty 1

## 2015-06-09 MED ORDER — LIDOCAINE HCL (CARDIAC) 20 MG/ML IV SOLN
INTRAVENOUS | Status: AC
  Filled 2015-06-09: qty 5

## 2015-06-09 MED ORDER — HYDROMORPHONE HCL 1 MG/ML IJ SOLN: 0.2500 mg | INTRAMUSCULAR | Status: DC | PRN

## 2015-06-09 MED ORDER — CEFAZOLIN SODIUM-DEXTROSE 2-3 GM-% IV SOLR
INTRAVENOUS | Status: DC | PRN
  Administered 2015-06-09: 2 g via INTRAVENOUS

## 2015-06-09 MED ORDER — ATROPINE SULFATE 0.4 MG/ML IJ SOLN
INTRAMUSCULAR | Status: AC
  Filled 2015-06-09: qty 1

## 2015-06-09 MED ORDER — EPHEDRINE SULFATE 50 MG/ML IJ SOLN
INTRAMUSCULAR | Status: AC
  Filled 2015-06-09: qty 1

## 2015-06-09 MED ORDER — PHENYLEPHRINE HCL 10 MG/ML IJ SOLN
INTRAMUSCULAR | Status: AC
  Filled 2015-06-09: qty 1

## 2015-06-09 MED ORDER — PROMETHAZINE HCL 25 MG/ML IJ SOLN: 6.2500 mg | INTRAMUSCULAR | Status: DC | PRN

## 2015-06-09 MED ORDER — MIDAZOLAM HCL 5 MG/5ML IJ SOLN
INTRAMUSCULAR | Status: DC | PRN
  Administered 2015-06-09: 2 mg via INTRAVENOUS

## 2015-06-09 SURGICAL SUPPLY — 43 items
APPLIER CLIP 9.375 MED OPEN (MISCELLANEOUS)
APR CLP MED 9.3 20 MLT OPN (MISCELLANEOUS)
BINDER BREAST MEDIUM (GAUZE/BANDAGES/DRESSINGS) ×2 IMPLANT
BLADE SURG 15 STRL LF DISP TIS (BLADE) ×1 IMPLANT
BLADE SURG 15 STRL SS (BLADE) ×3
CANISTER SUC SOCK COL 7IN (MISCELLANEOUS) ×1 IMPLANT
CANISTER SUCT 1200ML W/VALVE (MISCELLANEOUS) ×2 IMPLANT
CHLORAPREP W/TINT 26ML (MISCELLANEOUS) ×3 IMPLANT
CLIP APPLIE 9.375 MED OPEN (MISCELLANEOUS) IMPLANT
COVER BACK TABLE 60X90IN (DRAPES) ×3 IMPLANT
COVER MAYO STAND STRL (DRAPES) ×3 IMPLANT
COVER PROBE W GEL 5X96 (DRAPES) ×3 IMPLANT
DEVICE DUBIN W/COMP PLATE 8390 (MISCELLANEOUS) ×3 IMPLANT
DRAPE LAPAROTOMY 100X72 PEDS (DRAPES) ×3 IMPLANT
DRAPE UTILITY XL STRL (DRAPES) ×3 IMPLANT
ELECT COATED BLADE 2.86 ST (ELECTRODE) ×3 IMPLANT
ELECT REM PT RETURN 9FT ADLT (ELECTROSURGICAL) ×3
ELECTRODE REM PT RTRN 9FT ADLT (ELECTROSURGICAL) ×1 IMPLANT
GLOVE BIOGEL PI IND STRL 7.0 (GLOVE) IMPLANT
GLOVE BIOGEL PI IND STRL 8 (GLOVE) ×1 IMPLANT
GLOVE BIOGEL PI INDICATOR 7.0 (GLOVE) ×4
GLOVE BIOGEL PI INDICATOR 8 (GLOVE) ×2
GLOVE ECLIPSE 6.5 STRL STRAW (GLOVE) ×2 IMPLANT
GLOVE ECLIPSE 8.0 STRL XLNG CF (GLOVE) ×3 IMPLANT
GOWN STRL REUS W/ TWL LRG LVL3 (GOWN DISPOSABLE) ×2 IMPLANT
GOWN STRL REUS W/TWL LRG LVL3 (GOWN DISPOSABLE) ×6
HEMOSTAT SNOW SURGICEL 2X4 (HEMOSTASIS) IMPLANT
KIT MARKER MARGIN INK (KITS) ×3 IMPLANT
LIQUID BAND (GAUZE/BANDAGES/DRESSINGS) ×3 IMPLANT
NDL HYPO 25X1 1.5 SAFETY (NEEDLE) ×1 IMPLANT
NEEDLE HYPO 25X1 1.5 SAFETY (NEEDLE) ×3 IMPLANT
NS IRRIG 1000ML POUR BTL (IV SOLUTION) ×3 IMPLANT
PACK BASIN DAY SURGERY FS (CUSTOM PROCEDURE TRAY) ×3 IMPLANT
PENCIL BUTTON HOLSTER BLD 10FT (ELECTRODE) ×3 IMPLANT
SLEEVE SCD COMPRESS KNEE MED (MISCELLANEOUS) ×3 IMPLANT
SPONGE LAP 4X18 X RAY DECT (DISPOSABLE) ×3 IMPLANT
SUT MNCRL AB 4-0 PS2 18 (SUTURE) ×3 IMPLANT
SUT VICRYL 3-0 CR8 SH (SUTURE) ×3 IMPLANT
SYR CONTROL 10ML LL (SYRINGE) ×3 IMPLANT
TOWEL OR 17X24 6PK STRL BLUE (TOWEL DISPOSABLE) ×3 IMPLANT
TUBE CONNECTING 20'X1/4 (TUBING) ×1
TUBE CONNECTING 20X1/4 (TUBING) ×1 IMPLANT
YANKAUER SUCT BULB TIP NO VENT (SUCTIONS) ×2 IMPLANT

## 2015-06-09 NOTE — Transfer of Care (Signed)
Immediate Anesthesia Transfer of Care Note  Patient: Eileen Holland  Procedure(s) Performed: Procedure(s): RIGHT BREAST LUMPECTOMY WITH RADIOACTIVE SEED LOCALIZATION (Right)  Patient Location: PACU  Anesthesia Type:General  Level of Consciousness: sedated  Airway & Oxygen Therapy: Patient Spontanous Breathing and Patient connected to face mask oxygen  Post-op Assessment: Report given to RN and Post -op Vital signs reviewed and stable  Post vital signs: Reviewed and stable  Last Vitals:  Filed Vitals:   06/09/15 1235  BP: 157/83  Temp: 36.7 C  Resp: 20    Complications: No apparent anesthesia complications

## 2015-06-09 NOTE — Anesthesia Preprocedure Evaluation (Addendum)
Anesthesia Evaluation  Patient identified by MRN, date of birth, ID band Patient awake    Reviewed: Allergy & Precautions, NPO status   Airway Mallampati: I  TM Distance: >3 FB Neck ROM: Full    Dental  (+) Teeth Intact   Pulmonary neg pulmonary ROS, Current Smoker,    Pulmonary exam normal breath sounds clear to auscultation       Cardiovascular hypertension, Normal cardiovascular exam Rhythm:Regular Rate:Normal     Neuro/Psych  Headaches,    GI/Hepatic Neg liver ROS,   Endo/Other  negative endocrine ROS  Renal/GU negative Renal ROS     Musculoskeletal   Abdominal   Peds  Hematology  (+) anemia ,   Anesthesia Other Findings   Reproductive/Obstetrics                            Anesthesia Physical Anesthesia Plan  ASA: II  Anesthesia Plan: General   Post-op Pain Management:    Induction: Intravenous  Airway Management Planned: LMA  Additional Equipment:   Intra-op Plan:   Post-operative Plan: Extubation in OR  Informed Consent: I have reviewed the patients History and Physical, chart, labs and discussed the procedure including the risks, benefits and alternatives for the proposed anesthesia with the patient or authorized representative who has indicated his/her understanding and acceptance.   Dental advisory given  Plan Discussed with: CRNA and Surgeon  Anesthesia Plan Comments:         Anesthesia Quick Evaluation

## 2015-06-09 NOTE — Anesthesia Postprocedure Evaluation (Signed)
Anesthesia Post Note  Patient: Eileen Holland  Procedure(s) Performed: Procedure(s) (LRB): RIGHT BREAST LUMPECTOMY WITH RADIOACTIVE SEED LOCALIZATION (Right)  Anesthesia type: general  Patient location: PACU  Post pain: Pain level controlled  Post assessment: Patient's Cardiovascular Status Stable  Last Vitals:  Filed Vitals:   06/09/15 1445  BP: 144/86  Pulse: 73  Temp:   Resp: 18    Post vital signs: Reviewed and stable  Level of consciousness: sedated  Complications: No apparent anesthesia complications

## 2015-06-09 NOTE — H&P (View-Only) (Signed)
Eileen Holland 05/11/2015 1:41 PM Location: [facility] Patient #: [id number] DOB: 03/10/1961 Single / Language: [name] / Race: Black or African American Female History of Present Illness [name] MD; 05/11/2015 2:15 PM) Patient words: breast f/u   Pt sent at the request of Dr Radford Pax for right breast microcalcifications. She underwent core biopsy and the results of fibrocystic disease felt to be discordant with the imaging. She denies any breas pain nipple discharge or mass. Has sister with breast cancer.         CLINICAL DATA: Stereotactic biopsy 3D technique was attempted of calcifications in the far posterior upper outer right breast. EXAM: DIAGNOSTIC RIGHT MAMMOGRAM POST STEREOTACTIC BIOPSY COMPARISON: Previous exam(s). FINDINGS: Mammographic images were obtained following stereotactic guided biopsy of far posterior right breast calcifications. The coil shaped biopsy clip migrated. It should not be used for localization purposes. It is in the upper central right breast approximately 5 cm medial to the calcifications. IMPRESSION: Clip migration. The coil shaped biopsy clip should not be used for localization purposes. The patient left our office before I could give her these results in person. Final Assessment: Post Procedure Mammograms for Marker Placement Electronically Signed By: Curlene Dolphin M.D. On: 04/28/2015 16:15        Diagnosis Breast, right, needle core biopsy, far posterior, near pectoralis muscle, UOQ - FIBROCYSTIC CHANGES WITH CALCIFICATIONS - NO MALIGNANCY IDENTIFIED. - SEE MICROSCOPIC DESCRIPTION  Pathology reveals Right breast fibrocystic changes with calcifications. This was found to be discordant by Dr. Curlene Dolphin. Pathology results were discussed with the patient via telephone. The patient reported no problems with the biopsy site and is doing well. Post biopsy care and instructions were reviewed and questions  were answered. The patient was encouraged to call [facility] with any additional questions and or concerns. A surgical referral was arranged with Dr. Erroll Luna of [facility] on May 11, 2015. Pathology results reported by Terie Purser RN on April 29, 2015. Electronically Signed   Suspicious microcalcifications in the far posterior upper outer right breast, for which biopsy was recommended.  EXAM: RIGHT BREAST STEREOTACTIC CORE NEEDLE BIOPSY WITH 3D TECHNIQUE  COMPARISON: Previous exams.  FINDINGS: The patient and I discussed the procedure of stereotactic-guided biopsy including benefits and alternatives. We discussed the high likelihood of a successful procedure. We discussed the risks of the procedure including infection, bleeding, tissue injury, clip migration, and inadequate sampling. Informed written consent was given. The usual time out protocol was performed immediately prior to the procedure.  Using sterile technique and 2% Lidocaine as local anesthetic, under stereotactic guidance, a 9 gauge vacuum assisted device was used to perform core needle biopsy of calcifications in the deep upper outer left breast using a lateral to medial approach. The biopsy is technically challenging, as the majority of the calcifications project over the pectoralis muscle, with positioning in the CC and LM position. Therefore, in an attempt to avoid the pectoralis muscle, a few faint calcifications just anterior to the more prominent group of calcifications at the pectoralis muscle work targeted. Specimen radiograph was performed showing probably 2 tiny punctate calcifications. Specimens with calcifications are identified for pathology.  At the conclusion of the procedure, a coil shaped tissue marker clip was deployed into the biopsy cavity. Follow-up 2-view mammogram was performed and dictated  separately.  IMPRESSION: Stereotactic-guided biopsy of the right breast. Technically challenging due to the far posterior position of the majority of the calcifications, which projected over the  pectoralis muscle in both the CC and lateral to medial projections. No apparent complications.  Electronically Signed: By: Curlene Dolphin M.D. On: 04/28/2015 16:11.  The patient is a 54 year old female   Allergies [name], CMA; 05/11/2015 1:41 PM) Aspirin *ANALGESICS - NonNarcotic* Ibuprofen *ANALGESICS - ANTI-INFLAMMATORY*  Medication History ([name], CMA; 05/11/2015 1:42 PM) AmLODIPine Besylate (5MG  Tablet, Oral daily) Active. Cyclobenzaprine HCl (10MG  Tablet, Oral) Active. Hydrochlorothiazide (25MG  Tablet, Oral daily) Active. Fluticasone Propionate (50MCG/ACT Suspension, Nasal as needed) Active. Levocetirizine Dihydrochloride (5MG  Tablet, Oral daily) Active. Metoprolol Succinate ER (100MG  Tablet ER 24HR, Oral daily) Active. Losartan Potassium (100MG  Tablet, Oral daily) Active. PARoxetine HCl (20MG  Tablet, Oral daily) Active. Topiramate (50MG  Tablet, Oral daily) Active. Medications Reconciled    Vitals ([name] CMA; 05/11/2015 1:41 PM) 05/11/2015 1:41 PM Weight: 154 lb Height: 62in Body Surface Area: 1.75 m Body Mass Index: 28.17 kg/m Temp.: 97.60F(Temporal)  Pulse: 81 (Regular)  BP: 134/76 (Sitting, Left Arm, Standard)     Physical Exam (Thomas A. Cornett MD; 05/11/2015 2:13 PM)  General Mental Status-Alert. General Appearance-Consistent with stated age. Hydration-Well hydrated. Voice-Normal.  Head and Neck Head-normocephalic, atraumatic with no lesions or palpable masses. Trachea-midline. Thyroid Gland Characteristics - normal size and consistency.  Eye Eyeball - Bilateral-Extraocular movements intact. Sclera/Conjunctiva - Bilateral-No scleral icterus.  Chest and Lung Exam Chest and lung exam reveals -quiet,  even and easy respiratory effort with no use of accessory muscles and on auscultation, normal breath sounds, no adventitious sounds and normal vocal resonance. Inspection Chest Wall - Normal. Back - normal.  Breast Breast - Left-Symmetric, Non Tender, No Biopsy scars, no Dimpling, No Inflammation, No Lumpectomy scars, No Mastectomy scars, No Peau d' Orange. Breast - Right-Symmetric, Non Tender, No Biopsy scars, no Dimpling, No Inflammation, No Lumpectomy scars, No Mastectomy scars, No Peau d' Orange. Breast Lump-No Palpable Breast Mass.  Cardiovascular Cardiovascular examination reveals -normal heart sounds, regular rate and rhythm with no murmurs and normal pedal pulses bilaterally.  Abdomen Inspection Inspection of the abdomen reveals - No Hernias. Skin - Scar - no surgical scars. Palpation/Percussion Palpation and Percussion of the abdomen reveal - Soft, Non Tender, No Rebound tenderness, No Rigidity (guarding) and No hepatosplenomegaly. Auscultation Auscultation of the abdomen reveals - Bowel sounds normal.  Neurologic Neurologic evaluation reveals -alert and oriented x 3 with no impairment of recent or remote memory. Mental Status-Normal.  Musculoskeletal Normal Exam - Left-Upper Extremity Strength Normal and Lower Extremity Strength Normal. Normal Exam - Right-Upper Extremity Strength Normal and Lower Extremity Strength Normal.  Lymphatic Head & Neck  General Head & Neck Lymphatics: Bilateral - Description - Normal. Axillary  General Axillary Region: Bilateral - Description - Normal. Tenderness - Non Tender. Femoral & Inguinal  Generalized Femoral & Inguinal Lymphatics: Bilateral - Description - Normal. Tenderness - Non Tender.    Assessment & Plan (Thomas A. Cornett MD; 05/11/2015 2:16 PM)  MICROCALCIFICATION OF RIGHT BREAST ON MAMMOGRAM (R92.0) Impression: biopsy discordant with pathology recommend right breast seed localized lumpectomy for  diagnosis Risk of lumpectomy include bleeding, infection, seroma, more surgery, use of seed/wire, wound care, cosmetic deformity and the need for other treatments, death , blood clots, death. Pt agrees to proceed.  Current Plans Pt Education - Breast biopsy: discussed with patient and provided information. Pt Education - Breast conserving therapy: discussed with patient and provided information. Pt Education - Overview of benign breast disease: discussed with patient and provided information. Pt Education - CCS Breast Biopsy HCI: discussed with patient and provided  information.   The anatomy and the physiology was discussed. The pathophysiology and natural history of the disease was discussed. Options were discussed and recommendations were made. Technique, risks, benefits, & alternatives were discussed. Risks such as stroke, heart attack, bleeding, indection, death, and other risks discussed. Questions answered. The patient agrees to proceed.

## 2015-06-09 NOTE — Anesthesia Procedure Notes (Signed)
Procedure Name: LMA Insertion Date/Time: 06/09/2015 1:12 PM Performed by: Toula Moos L Pre-anesthesia Checklist: Patient identified, Emergency Drugs available, Suction available and Patient being monitored Patient Re-evaluated:Patient Re-evaluated prior to inductionOxygen Delivery Method: Circle System Utilized Preoxygenation: Pre-oxygenation with 100% oxygen Intubation Type: IV induction Ventilation: Mask ventilation without difficulty LMA: LMA inserted LMA Size: 4.0 Number of attempts: 1 Airway Equipment and Method: Bite block Placement Confirmation: positive ETCO2 Tube secured with: Tape Dental Injury: Teeth and Oropharynx as per pre-operative assessment

## 2015-06-09 NOTE — Interval H&P Note (Signed)
History and Physical Interval Note:  06/09/2015 1:01 PM  Eileen Holland  has presented today for surgery, with the diagnosis of Right Breast Microcalcifications  The various methods of treatment have been discussed with the patient and family. After consideration of risks, benefits and other options for treatment, the patient has consented to  Procedure(s): RIGHT BREAST LUMPECTOMY WITH RADIOACTIVE SEED LOCALIZATION (Right) as a surgical intervention .  The patient's history has been reviewed, patient examined, no change in status, stable for surgery.  I have reviewed the patient's chart and labs.  Questions were answered to the patient's satisfaction.     Debbe Crumble A.

## 2015-06-09 NOTE — Op Note (Signed)
Preoperative diagnosis: Right breast atypical microcalcifications upper outer quadrant  Postoperative diagnosis: Same  Procedure: Right breast seed localized lumpectomy  Surgeon: Erroll Luna M.D.  Anesthesia: LMA with 0.25% Sensorcaine local with epinephrine  EBL: Minimal  Right breast tissue with seed and clip. See and clip are in different tissue fragments but both were present in the final specimen and oriented. This was verified by radiograph.  Drains: None  Indications for procedure: The patient is a 54 year old female with right breast atypical microcalcifications. Gen. core biopsy which showed fibrocystic change but this was felt to be discordant an excisional lumpectomy was recommended.The procedure has been discussed with the patient. Alternatives to surgery have been discussed with the patient.  Risks of surgery include bleeding,  Infection,  Seroma formation, death,  and the need for further surgery.   The patient understands and wishes to proceed.  Description of procedure: The patient underwent seed placement by radiology. This was done prior to the procedure. She was met in the holding area. Neoprobe was used to verify location of right breast seen in the upper outer quadrant. Questions are answered in the right side was marked as correct with the assistance the patient. She was then taken back to the operating room placed upon the OR table. After induction of general anesthesia, right breast was prepped and draped in a sterile fashion and timeout was done to verify proper patient site and procedure. Neoprobe was used and the hotspot was identified in the right upper outer quadrant of the breast on the iodine settings. Curvilinear incision was made in the right upper outer quadrant of the breast and all tissue around the were excised. Radiograph revealed the calcifications and seed to be in the specimen. The biopsy clip was not. We took an additional inferior and medial margin. The  biopsy clip was approximately 3 cm away from the actual see. This tissue was removed. Gross margins negative. All tissue is oriented and inked. Cavity is made hemostatic and closed with 3-0 Vicryl and 4-0 Monocryl after placing clips to mark the cavity. Liquid adhesive applied. All final counts found to be correct. Binder placed. Patient awoke extubated taken recovery in satisfactory condition.

## 2015-06-09 NOTE — Discharge Instructions (Signed)
[facility] °Office Phone Number [phone] ° °BREAST BIOPSY/ PARTIAL MASTECTOMY: POST OP INSTRUCTIONS ° °Always review your discharge instruction sheet given to you by the facility where your surgery was performed. ° °IF YOU HAVE DISABILITY OR FAMILY LEAVE FORMS, YOU MUST BRING THEM TO THE OFFICE FOR PROCESSING.  DO NOT GIVE THEM TO YOUR DOCTOR. ° °1. A prescription for pain medication may be given to you upon discharge.  Take your pain medication as prescribed, if needed.  If narcotic pain medicine is not needed, then you may take acetaminophen (Tylenol) or ibuprofen (Advil) as needed. °2. Take your usually prescribed medications unless otherwise directed °3. If you need a refill on your pain medication, please contact your pharmacy.  They will contact our office to request authorization.  Prescriptions will not be filled after 5pm or on week-ends. °4. You should eat very light the first 24 hours after surgery, such as soup, crackers, pudding, etc.  Resume your normal diet the day after surgery. °5. Most patients will experience some swelling and bruising in the breast.  Ice packs and a good support bra will help.  Swelling and bruising can take several days to resolve.  °6. It is common to experience some constipation if taking pain medication after surgery.  Increasing fluid intake and taking a stool softener will usually help or prevent this problem from occurring.  A mild laxative (Milk of Magnesia or Miralax) should be taken according to package directions if there are no bowel movements after 48 hours. °7. Unless discharge instructions indicate otherwise, you may remove your bandages 24-48 hours after surgery, and you may shower at that time.  You may have steri-strips (small skin tapes) in place directly over the incision.  These strips should be left on the skin for 7-10 days.  If your surgeon used skin glue on the incision, you may shower in 24 hours.  The glue will flake off over the  next 2-3 weeks.  Any sutures or staples will be removed at the office during your follow-up visit. °8. ACTIVITIES:  You may resume regular daily activities (gradually increasing) beginning the next day.  Wearing a good support bra or sports bra minimizes pain and swelling.  You may have sexual intercourse when it is comfortable. °a. You may drive when you no longer are taking prescription pain medication, you can comfortably wear a seatbelt, and you can safely maneuver your car and apply brakes. °b. RETURN TO WORK:  ______________________________________________________________________________________ °9. You should see your doctor in the office for a follow-up appointment approximately two weeks after your surgery.  Your doctor’s nurse will typically make your follow-up appointment when she calls you with your pathology report.  Expect your pathology report 2-3 business days after your surgery.  You may call to check if you do not hear from us after three days. °10. OTHER INSTRUCTIONS: _______________________________________________________________________________________________ _____________________________________________________________________________________________________________________________________ °_____________________________________________________________________________________________________________________________________ °_____________________________________________________________________________________________________________________________________ ° °WHEN TO CALL YOUR DOCTOR: °1. Fever over 101.0 °2. Nausea and/or vomiting. °3. Extreme swelling or bruising. °4. Continued bleeding from incision. °5. Increased pain, redness, or drainage from the incision. ° °The clinic staff is available to answer your questions during regular business hours.  Please don’t hesitate to call and ask to speak to one of the nurses for clinical concerns.  If you have a medical emergency, go to the nearest  emergency room or call 911.  A surgeon from [facility] is always on call at the hospital. ° °For further questions, please visit [email or web address]  ° ° ° °  Post Anesthesia Home Care Instructions ° °Activity: °Get plenty of rest for the remainder of the day. A responsible adult should stay with you for 24 hours following the procedure.  °For the next 24 hours, DO NOT: °-Drive a car °-Operate machinery °-Drink alcoholic beverages °-Take any medication unless instructed by your physician °-Make any legal decisions or sign important papers. ° °Meals: °Start with liquid foods such as gelatin or soup. Progress to regular foods as tolerated. Avoid greasy, spicy, heavy foods. If nausea and/or vomiting occur, drink only clear liquids until the nausea and/or vomiting subsides. Call your physician if vomiting continues. ° °Special Instructions/Symptoms: °Your throat may feel dry or sore from the anesthesia or the breathing tube placed in your throat during surgery. If this causes discomfort, gargle with warm salt water. The discomfort should disappear within 24 hours. ° °If you had a scopolamine patch placed behind your ear for the management of post- operative nausea and/or vomiting: ° °1. The medication in the patch is effective for 72 hours, after which it should be removed.  Wrap patch in a tissue and discard in the trash. Wash hands thoroughly with soap and water. °2. You may remove the patch earlier than 72 hours if you experience unpleasant side effects which may include dry mouth, dizziness or visual disturbances. °3. Avoid touching the patch. Wash your hands with soap and water after contact with the patch. °  ° °

## 2015-06-10 ENCOUNTER — Encounter (HOSPITAL_BASED_OUTPATIENT_CLINIC_OR_DEPARTMENT_OTHER): Payer: Self-pay | Admitting: Surgery

## 2015-06-16 ENCOUNTER — Other Ambulatory Visit: Payer: Self-pay | Admitting: Surgery

## 2015-06-16 DIAGNOSIS — D0510 Intraductal carcinoma in situ of unspecified breast: Secondary | ICD-10-CM

## 2015-06-22 NOTE — Progress Notes (Signed)
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 - DUCTAL CARCINOMA IN SITU IS FOCALLY 0.2 CM FROM MEDIAL MARGIN. - OTHER MARGINS ARE NEGATIVE. - SEE ONCOLOGY TEMPLATE. 1 of Receptor Status: ER(95%, PR (90%), Her2-neu ()  Calcifications found on mammogram in right breast on rountine mammogram on 04/13/15  Past/Anticipated interventions by surgeon, if any: Lumpectomy  Past/Anticipated interventions by medical oncology, if any: Chemotherapy: ?????  Lymphedema issues, if any: None  Pain issues, if any: Constant pain for the past couple of days.  Very sensitive to touch and swollen - grades pain as a level 5  SAFETY ISSUES:  Prior radiation? No  Pacemaker/ICD? No  Possible current pregnancy? No  Is the patient on methotrexate? No  Current Complaints / other details:   Sister - Breast Cancer at age 25.  Menarche age 48, BC x age 81 years, G1,P1, Menopause ???     Berlie Persky, Crista Curb, RN 06/22/2015,6:36 PM

## 2015-06-23 ENCOUNTER — Encounter: Payer: Self-pay | Admitting: Radiation Oncology

## 2015-06-23 ENCOUNTER — Ambulatory Visit
Admission: RE | Admit: 2015-06-23 | Discharge: 2015-06-23 | Disposition: A | Discharge status: Home / Self Care | Payer: Managed Care, Other (non HMO) | Source: Ambulatory Visit | Attending: Radiation Oncology | Admitting: Radiation Oncology

## 2015-06-23 ENCOUNTER — Telehealth: Payer: Self-pay | Admitting: *Deleted

## 2015-06-23 VITALS — BP 135/89 | HR 81 | Temp 98.0°F | Ht 62.5 in | Wt 155.1 lb

## 2015-06-23 DIAGNOSIS — F1010 Alcohol abuse, uncomplicated: Secondary | ICD-10-CM | POA: Insufficient documentation

## 2015-06-23 DIAGNOSIS — C50411 Malignant neoplasm of upper-outer quadrant of right female breast: Secondary | ICD-10-CM | POA: Insufficient documentation

## 2015-06-23 DIAGNOSIS — Z808 Family history of malignant neoplasm of other organs or systems: Secondary | ICD-10-CM | POA: Insufficient documentation

## 2015-06-23 DIAGNOSIS — Z510 Encounter for antineoplastic radiation therapy: Secondary | ICD-10-CM | POA: Insufficient documentation

## 2015-06-23 DIAGNOSIS — C50911 Malignant neoplasm of unspecified site of right female breast: Secondary | ICD-10-CM | POA: Diagnosis present

## 2015-06-23 DIAGNOSIS — Z170 Estrogen receptor positive status [ER+]: Secondary | ICD-10-CM | POA: Diagnosis not present

## 2015-06-23 DIAGNOSIS — Z803 Family history of malignant neoplasm of breast: Secondary | ICD-10-CM | POA: Insufficient documentation

## 2015-06-23 DIAGNOSIS — F329 Major depressive disorder, single episode, unspecified: Secondary | ICD-10-CM | POA: Insufficient documentation

## 2015-06-23 DIAGNOSIS — F17210 Nicotine dependence, cigarettes, uncomplicated: Secondary | ICD-10-CM | POA: Diagnosis not present

## 2015-06-23 DIAGNOSIS — I10 Essential (primary) hypertension: Secondary | ICD-10-CM | POA: Diagnosis not present

## 2015-06-23 DIAGNOSIS — F419 Anxiety disorder, unspecified: Secondary | ICD-10-CM | POA: Diagnosis not present

## 2015-06-23 NOTE — Addendum Note (Signed)
Encounter addended by: Arloa Koh, MD on: 06/23/2015  2:00 PM<BR>     Documentation filed: Dx Association, Flowsheet VN, Orders

## 2015-06-23 NOTE — Telephone Encounter (Signed)
Called patient to inform of mammogram on 07-01-15- arrival time - 12:40 pm @ [facility], spoke with patient and she is aware of this appt.

## 2015-06-23 NOTE — Addendum Note (Signed)
Encounter addended by: Benn Moulder, RN on: 06/23/2015  5:59 PM<BR>     Documentation filed: [facility] Medal VN

## 2015-06-23 NOTE — Progress Notes (Addendum)
[facility] NEW PATIENT EVALUATION  Name: Eileen Holland MRN: [id number]  Date:   06/23/2015           DOB: 06-26-1961  Status: outpatient   CC: Suzanna Obey, MD  Erroll Luna, MD, Dr. Burney Gauze   REFERRING PHYSICIAN: Erroll Luna, MD   DIAGNOSIS: Stage 0 (Tis N0 M0) DCIS of the right breast   HISTORY OF PRESENT ILLNESS:  Eileen Holland is a 54 y.o. female who is seen today through the courtesy of Dr. Brantley Stage for consideration of radiation therapy in the management of her DCIS of the right breast.  At the time of a screening mammogram on 04/13/2015 she was felt to have worrisome calcifications within the right breast.  Additional views at the [facility] on 04/23/2015 showed a grouped area of pleomorphic calcifications within the upper outer quadrant of the right breast, posterior depth spanning 1.7 cm.  A needle core biopsy on 04/28/2015 was nondiagnostic.  Because of the concern about DCIS she underwent a right partial mastectomy on 06/09/2015.  She was found to have intermediate grade DCIS with necrosis and calcifications, focally 0.2 cm from the medial margin.  There were 4 small foci of DCIS none measuring more than 0.4 cm.  Her DCIS was ER positive at 95%, and PR positive at 90%.  She is doing well postoperatively although she does have right breast discomfort.  She has not yet seen medical oncology.  She is postmenopausal.  PREVIOUS RADIATION THERAPY: No   PAST MEDICAL HISTORY:  has a past medical history of Hypertension; Depression; Allergy; Headache; Anxiety; Headache, migraine (07/04/2014); and Breast calcification, right.     PAST SURGICAL HISTORY:  Past Surgical History  Procedure Laterality Date  . Knee arthroscopy Right 2000  . Vaginal hysterectomy  2012  . Colonscopy     . Breast lumpectomy with radioactive seed localization Right 06/09/2015    Procedure: RIGHT BREAST LUMPECTOMY WITH RADIOACTIVE SEED LOCALIZATION;  Surgeon: Erroll Luna, MD;  Location: [facility];  Service: General;  Laterality: Right;     FAMILY HISTORY: family history is negative for Colon cancer.  Her mother is alive at 44 and her father is alive at 84, both in reasonably good health.  A sister was diagnosed with breast cancer at age 53, she is a currently alive and well at 51.  I believe I was her radiation oncologist.   SOCIAL HISTORY:  reports that she has been smoking Cigarettes.  She has a 15 pack-year smoking history. She has never used smokeless tobacco. She reports that she drinks alcohol. She reports that she does not use illicit drugs.  Single, one child.  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   ALLERGIES: Aspirin and Ibuprofen   MEDICATIONS:  Current Outpatient Prescriptions  Medication Sig Dispense Refill  . ACETAMINOPHEN-BUTALBITAL 50-325 MG TABS Take 1 tablet by mouth every 4 (four) hours as needed (headache.).    Marland Kitchen B Complex Vitamins (B COMPLEX PO) Take 1 tablet by mouth daily at 12 noon. (B complex-C-E-zinc)    . fluticasone (VERAMYST) 27.5 MCG/SPRAY nasal spray Place 2 sprays into the nose every morning.     . hydrochlorothiazide 25 MG tablet Take 25 mg by mouth every morning.     Marland Kitchen ketoconazole (NIZORAL) 2 % cream Apply 1 application topically daily as needed for irritation.    Marland Kitchen levocetirizine (XYZAL) 5 MG tablet Take 5 mg by mouth daily at 12 noon. For allergies     .  losartan (COZAAR) 100 MG tablet Take 100 mg by mouth every morning.    . metoprolol succinate (TOPROL-XL) 100 MG 24 hr tablet Take 100 mg by mouth every morning. Take with or immediately following a meal.    . oxyCODONE-acetaminophen (ROXICET) 5-325 MG tablet Take 1-2 tablets by mouth every 4 (four) hours as needed. 30 tablet 0  . PARoxetine (PAXIL) 20 MG tablet Take 20 mg by mouth every morning.      . Prenatal Vit-Fe Fumarate-FA (PRENATAL PO) Take 1 tablet by mouth daily at 12 noon.    Marland Kitchen ALPRAZolam (XANAX) 0.5 MG tablet Take 0.5 mg by mouth 2 (two)  times daily as needed for anxiety.    Marland Kitchen amLODipine (NORVASC) 10 MG tablet Take 10 mg by mouth every evening. @@7 :30 pm     No current facility-administered medications for this encounter.     REVIEW OF SYSTEMS:  Pertinent items are noted in HPI.    PHYSICAL EXAM:  height is 5' 2.5" (1.588 m) and weight is 155 lb 1.6 oz (70.353 kg). Her temperature is 98 F (36.7 C). Her blood pressure is 135/89 and her pulse is 81.   Alert and oriented 54 year old black female appearing her stated age.  Head and neck examination: Grossly unremarkable.  Nodes: Without palpable cervical, supraclavicular, or axillary lymphadenopathy.  Breasts: There is a partial mastectomy wound along the upper outer quadrant of the right breast at approximately 10:00.  The wound is healing well.  No masses are appreciated.  Left breast without masses or lesions.  Extremities: Without edema.   LABORATORY DATA:  Lab Results  Component Value Date   WBC 20.7* 07/18/2014   HGB 11.2* 07/18/2014   HCT 33.1* 07/18/2014   MCV 103.8* 07/18/2014   PLT 215 07/18/2014   Lab Results  Component Value Date   NA 137 07/18/2014   K 4.1 07/18/2014   CL 101 07/18/2014   CO2 24 07/18/2014   Lab Results  Component Value Date   ALT 8 03/24/2011   AST 15 03/24/2011   ALKPHOS 61 03/24/2011   BILITOT 0.4 03/24/2011      IMPRESSION: Stage 0 (Tis N0 M0) intermediate grade DCIS of the right breast.  I explained to the patient that her local treatment options include mastectomy versus partial mastectomy plus or minus radiation therapy, plus or minus antiestrogen therapy.  In view of her young age, intermediate grade with necrosis and close margins, I do recommend radiation therapy.  She is a candidate for hypofractionated treatment.  We discussed the potential acute and late toxicities of radiation therapy.  She'll like to finish before Christmas, so we will get her scheduled for a baseline mammogram next week to rule out residual  calcifications, and have her return here for CT simulation on November 21 so we can begin her treatment the following week.  Consent is signed today.  She would be a candidate for adjuvant/chemotherapy preventative antiestrogen therapy following completion of radiation therapy.  Medical oncology consultation is pending.   PLAN: As discussed above.   I spent 45 minutes face to face with the patient and more than 50% of that time was spent in counseling and/or coordination of care.  ADDENDUM: The patient was presented at the Wednesday morning multidisciplinary clinic. She has seen Dr. Marin Olp in the past for evaluation of macrocytic changes of her red blood cells without anemia. Consultation is pending for her to see Dr. Marin Olp.  Her sister was diagnosed with breast cancer at a  premenopausal age, and she is a candidate for genetic testing.  Of note is that she tells me that her sister underwent genetic testing approximately 9 years ago at age 38.  She was negative.  I will arrange for genetic consultation and she will keep her scheduled preradiation therapy mammogram appointment.  We can obviously delay any radiation therapy CT simulation should she undergo genetic testing and be willing to have a mastectomy if so desired.

## 2015-06-24 ENCOUNTER — Telehealth: Payer: Self-pay | Admitting: *Deleted

## 2015-06-24 NOTE — Telephone Encounter (Signed)
CALLED PATIENT TO INFORM OF GENETIC CONSULTATION WITH KAYLA BOGGS ON 07-16-15 @ 2 PM, SPOKE WITH PATIENT AND SHE IS AWARE OF THIS APPT.

## 2015-06-24 NOTE — Addendum Note (Signed)
Encounter addended by: Arloa Koh, MD on: 06/24/2015  8:02 AM<BR>     Documentation filed: Notes Section

## 2015-06-24 NOTE — Addendum Note (Signed)
Encounter addended by: Arloa Koh, MD on: 06/24/2015  8:27 AM<BR>     Documentation filed: Notes Section, Orders, Dx Association, Flowsheet VN, Clinical Notes

## 2015-06-25 ENCOUNTER — Telehealth: Payer: Self-pay | Admitting: *Deleted

## 2015-06-25 NOTE — Telephone Encounter (Signed)
Called pt to see if she would be able to come to [facility] today at 3pm for genetic counseling. Pt unable d/t lack of transportation. Called pt back with another appt for 11/15 at 1100. Pt working on transportation and will call back. Sent request to SW to see what options are available for transportation assistance.

## 2015-06-25 NOTE — Telephone Encounter (Signed)
Called pt to discuss care plan summary and provide navigation resources. Pt denies questions or concerns regarding care plan summary. Encourage pt to call with needs. Received verbal understanding. Contact information provided.

## 2015-06-26 ENCOUNTER — Telehealth: Payer: Self-pay | Admitting: *Deleted

## 2015-06-26 ENCOUNTER — Encounter: Payer: Self-pay | Admitting: *Deleted

## 2015-06-26 NOTE — Progress Notes (Signed)
   Eileen Holland  Clinical Social [place] was referred by patient navigator for assessment of psychosocial needs due to possible transportation issues.  Clinical Social Worker spoke with patient via phone to offer support and assess for needs.  Pt reports to get rides with her daughter often as they [facility] at the same facility. She is hopeful that her daughter can take her to her next week's appointments, but they are trying to see if she can get off [facility]. CSW reviewed transportation options for future appointments; [facility], [facility] and bus. Pt could use the bus, but works way [place] on [address] and the bus is tricky to get out that far. CSW educated pt on options for once she starts radiation, Advertising account executive for transportation needs. Pt plans to call [facility] as well and CSW explained that they need 5 business day notice, so doubtful they could take next week, but could do future appointments. CSW explained role of CSW team and how to reach out as needs arise. Pt will keep working on ride for next week and will let us know if it works out.  Clinical Social [place] interventions: Resource education  Eileen [place], [place] Worker [place]  [place] Phone: [phone] Fax: [phone]

## 2015-06-26 NOTE — Telephone Encounter (Signed)
  Oncology Nurse Navigator Documentation    Navigator Encounter Type: Telephone (06/26/15 1000)         Interventions: Coordination of Care (06/26/15 1000)   Coordination of Care: Other (Genetics- new appt 06/30/15) (06/26/15 1000)        Time Spent with Patient: 15 (06/26/15 1000)

## 2015-06-30 ENCOUNTER — Encounter: Payer: Self-pay | Admitting: Radiation Oncology

## 2015-06-30 ENCOUNTER — Ambulatory Visit (HOSPITAL_BASED_OUTPATIENT_CLINIC_OR_DEPARTMENT_OTHER): Payer: Managed Care, Other (non HMO) | Admitting: Genetic Counselor

## 2015-06-30 ENCOUNTER — Encounter: Payer: Self-pay | Admitting: Genetic Counselor

## 2015-06-30 ENCOUNTER — Other Ambulatory Visit: Payer: Managed Care, Other (non HMO)

## 2015-06-30 DIAGNOSIS — Z803 Family history of malignant neoplasm of breast: Secondary | ICD-10-CM | POA: Insufficient documentation

## 2015-06-30 DIAGNOSIS — C50411 Malignant neoplasm of upper-outer quadrant of right female breast: Secondary | ICD-10-CM

## 2015-06-30 DIAGNOSIS — Z315 Encounter for genetic counseling: Secondary | ICD-10-CM | POA: Diagnosis not present

## 2015-06-30 DIAGNOSIS — Z801 Family history of malignant neoplasm of trachea, bronchus and lung: Secondary | ICD-10-CM

## 2015-06-30 DIAGNOSIS — Z800 Family history of malignant neoplasm of digestive organs: Secondary | ICD-10-CM | POA: Diagnosis not present

## 2015-06-30 NOTE — Progress Notes (Signed)
REFERRING PROVIDER: Arloa Koh, MD  PRIMARY PROVIDER:  Suzanna Obey, MD  PRIMARY REASON FOR VISIT:  1. Primary cancer of upper outer quadrant of right female breast ([place])   2. Family history of breast cancer in [place]   98. Family history of stomach cancer   4. Family history of lung cancer      HISTORY OF PRESENT ILLNESS:   Eileen [place], a 54 y.o. female, was seen for a [place] cancer genetics consultation at the request of Dr. Valere Dross due to a personal history of breast cancer and family history of breast cancer.  Eileen [place] presents to clinic today to discuss the possibility of a hereditary predisposition to cancer, genetic testing, and to further clarify her future cancer risks, as well as potential cancer risks for family members.   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  This was treated with right lumpectomy, to be followed with radiation.     CANCER HISTORY:   No history exists.     HORMONAL RISK FACTORS:  Menarche was at age 46.  First live birth at age 53.  OCP use for approximately 13 years.  Ovaries intact: yes.  Hysterectomy: yes, in 2012. Menopausal status: postmenopausal  HRT use: 0 years. Colonoscopy: yes; one tubular adenoma found in 2015; 5-year schedule. Mammogram within the last year: had not had a mammogram in 3 years since this most recent. Number of breast biopsies: 2. Up to date with pelvic exams:  yes. Any excessive radiation exposure in the past:  no  Past Medical History  Diagnosis Date  . Hypertension   . Depression   . Allergy   . Headache     hx of per PCP history  . Anxiety     panic attacks per PCP history  . Headache, migraine 07/04/2014    Last Assessment & Plan:  Refilled butalbital for rare use, once monthly as cannot take NSAIDs.  Advised if needs it more regularly, to follow-up and discuss management furtehr   . Breast calcification, right      Past Surgical History  Procedure Laterality Date  . Knee arthroscopy Right 2000  . Vaginal hysterectomy  2012  . Colonscopy     . Breast lumpectomy with radioactive seed localization Right 06/09/2015    Procedure: RIGHT BREAST LUMPECTOMY WITH RADIOACTIVE SEED LOCALIZATION;  Surgeon: Erroll Luna, MD;  Location: [facility];  Service: General;  Laterality: Right;    Social History   Social History  . Marital Status: Single    Spouse Name: N/A  . Number of Children: N/A  . Years of Education: N/A   Social History Main Topics  . Smoking status: Current Every Day Smoker -- 0.50 packs/day for 32 years    Types: Cigarettes  . Smokeless tobacco: Never Used  . Alcohol Use: Yes     Comment: occas - maybe 1 drink every 3 mos  . Drug Use: No  . Sexual Activity: Not Asked   Other Topics Concern  . None   Social History Narrative     FAMILY HISTORY:  We obtained a detailed, 4-generation family history.  Significant diagnoses are listed below: Family History  Problem Relation Age of Onset  . Colon cancer Neg Hx   . Lung cancer Father 28    former smoker  . Colon polyps Father     unspecified number  . Breast cancer [place]  46    negative BRCA testing in 2007  . Stomach cancer Maternal Uncle     dx. 50s-early 19s  . Heart attack Paternal Uncle     Eileen [place] has on daughter, Eileen [place], who is 74 and who has never had cancer.  Eileen [place] has two daughters of her own (ages 55 and 57).  Eileen [place] has two full sisters and one full brother between the ages of 21 and 66.  Her older [place] was diagnosed with breast cancer at the age of 71 and reportedly had negative genetic testing at that time (2007).  Eileen [place] also has two maternal half-brothers who are in their late 51s and have never had cancer.    Eileen [place] mother is currently 105 and has never had a history of cancer or any colon polyps.  Eileen [place] mother has one full brother and one full [place].  Her brother died of  stomach cancer in his 64s-early 60s.  This uncle had one son who died of AIDS-related illness at 41.  Eileen [place] maternal aunt is currently 83 and has never had cancer.  She had five sons and one daughter; one son was killed at 21, but there is no history of cancer in any of these first cousins.  Eileen [place] maternal grandmother passed away when she was a year old.  Her maternal grandfather died when Eileen [place]'s mother was still young.  She has no further information for her grandparents or for her maternal great aunts/uncles or great grandparents.  Eileen [place] father is currently 3.  He has a history of lung cancer, diagnosed at 47 and a history of colon polyps (unknown number).  He is a former smoker.  Eileen [place] father had at least one brother who died of a heart attack.  Eileen [place] is aware that he had multiple half siblings, but has no information for these relatives.  She has no further information for her paternal grandparents or for more distant paternal relatives.    Patient's maternal ancestors are of Caucasian and African American descent, and paternal ancestors are of African American descent. There is no reported Ashkenazi Jewish ancestry. There is no known consanguinity.  GENETIC COUNSELING ASSESSMENT: Eileen Satchell is a 54 y.o. female with a personal and family history of breast cancer which is somewhat suggestive of a hereditary breast cancer syndrome and predisposition to cancer. We, therefore, discussed and recommended the following at today's visit.   DISCUSSION: We reviewed the characteristics, features and inheritance patterns of hereditary cancer syndromes, particularly those caused by mutations within BRCA1/2, CDH1, and other genes for which, if mutated, can cause an increased risk for breast and other related cancers. We also discussed genetic testing, including the appropriate family members to test, the process of testing, insurance coverage and turn-around-time for results. We  discussed the implications of a negative, positive and/or variant of uncertain significant result.  We discussed that genetic testing options have been updated since Eileen [place] was tested in 2007--that are testing for BRCA1/2 is better and that we now are aware of more genes that can cause increased breast cancer risks.  We recommended Eileen [place] pursue genetic testing for the 20-gene Breast/Ovarian Cancer Panel through [facility] Hope Pigeon, MD).  The Breast/Ovarian Cancer Panel offered by [facility] Hope Pigeon, MD) includes sequencing and deletion/duplication analysis for the following 19 genes:  ATM, BARD1, BRCA1, BRCA2, BRIP1, CDH1, CHEK2, FANCC, MLH1, MSH2, MSH6, NBN, PALB2, PMS2, PTEN, RAD51C, RAD51D, TP53, and XRCC2.  This  panel also includes deletion/duplication analysis (without sequencing) for one gene, EPCAM.  Based on Ms. Pribble's personal and family history of cancer, she meets medical criteria for genetic testing. Despite that she meets criteria, she may still have an out of pocket cost. We discussed that if her out of pocket cost for testing is over $100, the laboratory will call and confirm whether she wants to proceed with testing.  If the out of pocket cost of testing is less than $100 she will be billed by the genetic testing laboratory.   PLAN: After considering the risks, benefits, and limitations, Ms. Dieguez  provided informed consent to pursue genetic testing and the blood sample was sent to [facility] for analysis of the 20-gene Breast/Ovarian Cancer Panel test. Results will be available within approximately 2-3 weeks' time, at which point they will be disclosed by telephone to Ms. Hintze, as will any additional recommendations warranted by these results. Ms. Marinaccio will receive a summary of her genetic counseling visit and a copy of her results once available. This information will also be available in Epic. We encouraged Ms. Delprado to remain in contact  with cancer genetics annually so that we can continuously update the family history and inform her of any changes in cancer genetics and testing that may be of benefit for her family. Ms. Uncapher questions were answered to her satisfaction today. Our contact information was provided should additional questions or concerns arise.  Thank you for the referral and allowing [place] to share in the care of your patient.   Jeanine Luz, MS Genetic Counselor [email or web address] Phone: [phone]  The patient was seen for a total of 55 minutes in face-to-face genetic counseling.  This patient was discussed with Drs. Magrinat, Lindi Adie and/or Burr Medico who agrees with the above.    _______________________________________________________________________ For Office Staff:  Number of people involved in session: 1 Was an Intern/ student involved with case: no

## 2015-06-30 NOTE — Progress Notes (Signed)
Paperwork received from patients daughter for FMLA, paperwork given to RN, (patient doesn't have appointments yet so may be a delay in paperwork) 06/30/15 Eileen [place])

## 2015-07-01 ENCOUNTER — Ambulatory Visit
Admission: RE | Admit: 2015-07-01 | Discharge: 2015-07-01 | Disposition: A | Discharge status: Home / Self Care | Payer: Managed Care, Other (non HMO) | Source: Ambulatory Visit | Attending: Radiation Oncology | Admitting: Radiation Oncology

## 2015-07-01 ENCOUNTER — Encounter: Payer: Self-pay | Admitting: Radiation Oncology

## 2015-07-01 DIAGNOSIS — C50411 Malignant neoplasm of upper-outer quadrant of right female breast: Secondary | ICD-10-CM

## 2015-07-01 NOTE — Progress Notes (Signed)
Chart note: Eileen [place] had mammography earlier today and there were no residual calcifications within her tumor bed.  She does have some enlarged nodes which are felt to be reactive.  She visited the genetics counselor, and she is going to proceed with genetic testing.  I told that if she is positive, she may want to consider mastectomy or bilateral mastectomies to reduce the risk for development of a new breast cancer.  She tells me that she is not interested in pursuing mastectomy or any further surgery if at all possible even if she is genetically positive.  She does have 1 daughter in her 93s and this information would be helpful.  She also wants to finish radiation therapy and go to [place] for the Christmas holidays.  Therefore, she will keep her scheduled CT simulation  appointment with me on Monday, November 21.

## 2015-07-03 ENCOUNTER — Telehealth: Payer: Self-pay | Admitting: *Deleted

## 2015-07-03 NOTE — Telephone Encounter (Signed)
[name] called and informed that our goal is to have her FMLA paperwork for her daughter will be completed by the time she arrives for her afternoon simulation appt. On Monday.  She expressed thanks.

## 2015-07-06 ENCOUNTER — Ambulatory Visit
Admission: RE | Admit: 2015-07-06 | Discharge: 2015-07-06 | Disposition: A | Discharge status: Home / Self Care | Payer: Managed Care, Other (non HMO) | Source: Ambulatory Visit | Attending: Radiation Oncology | Admitting: Radiation Oncology

## 2015-07-06 ENCOUNTER — Encounter: Payer: Self-pay | Admitting: Radiation Oncology

## 2015-07-06 DIAGNOSIS — C50411 Malignant neoplasm of upper-outer quadrant of right female breast: Secondary | ICD-10-CM

## 2015-07-06 DIAGNOSIS — Z510 Encounter for antineoplastic radiation therapy: Secondary | ICD-10-CM | POA: Diagnosis not present

## 2015-07-06 NOTE — Progress Notes (Signed)
Complex simulation/treatment planning note: The patient was taken to the CT simulator.  A custom Vac lock immobilization device was constructed on a custom breast board for immobilization.  Her right breast was marked with radiopaque wires along with her partial mastectomy scar.  She was then scanned.  An isocenter was chosen.  The CT data set was sent to the planning system where contoured her tumor bed.  Dosimetry contoured her remaining anatomy.  She was setup to medial lateral right breast tangents with unique MLCs.  I'm prescribing 4256 cGy in 16 sessions utilizing 6 MV photons.  No boost.  She is now ready for 3-D simulation.  Of note is that we had another discussion regarding her decision should she be found to be genetically positive.  She would like to avoid mastectomies regardless of her genetic testing.  Therefore we are proceeding with radiation therapy.  We should be done before the holidays so she can travel to [place].

## 2015-07-06 NOTE — Progress Notes (Signed)
Paper work [employer]) received from nurse, faxed to Ernie Avena, [place] @ [phone], will call daughter to pick up paperwork 07/06/15 Ardeen Fillers)

## 2015-07-07 ENCOUNTER — Encounter: Payer: Self-pay | Admitting: Radiation Oncology

## 2015-07-07 ENCOUNTER — Encounter: Payer: Managed Care, Other (non HMO) | Admitting: Genetic Counselor

## 2015-07-07 ENCOUNTER — Other Ambulatory Visit: Payer: Managed Care, Other (non HMO)

## 2015-07-07 DIAGNOSIS — Z510 Encounter for antineoplastic radiation therapy: Secondary | ICD-10-CM | POA: Diagnosis not present

## 2015-07-07 NOTE — Progress Notes (Signed)
3-D simulation note: The patient completed 3-D simulation in the management of her carcinoma of the right breast.  Dose volume histograms were obtained for the right breast tumor bed, lungs, and heart.  We met our department will guidelines.  I am prescribing 4256 cGy 16 sessions utilizing mixed 6 MV/10 MV photons.  There are unique MLCs, one for each tangent and also 2 unique electronic compensation fields, one for each tangent for a total of 4 complex treatment devices.  No boost.

## 2015-07-08 ENCOUNTER — Encounter: Payer: Self-pay | Admitting: *Deleted

## 2015-07-08 ENCOUNTER — Ambulatory Visit
Admission: RE | Admit: 2015-07-08 | Discharge: 2015-07-08 | Disposition: A | Discharge status: Home / Self Care | Payer: Managed Care, Other (non HMO) | Source: Ambulatory Visit | Attending: Radiation Oncology | Admitting: Radiation Oncology

## 2015-07-08 DIAGNOSIS — Z510 Encounter for antineoplastic radiation therapy: Secondary | ICD-10-CM | POA: Diagnosis not present

## 2015-07-13 ENCOUNTER — Ambulatory Visit: Payer: Self-pay | Admitting: Genetic Counselor

## 2015-07-13 ENCOUNTER — Ambulatory Visit
Admission: RE | Admit: 2015-07-13 | Discharge: 2015-07-13 | Disposition: A | Discharge status: Home / Self Care | Payer: Managed Care, Other (non HMO) | Source: Ambulatory Visit | Attending: Radiation Oncology | Admitting: Radiation Oncology

## 2015-07-13 ENCOUNTER — Encounter: Payer: Self-pay | Admitting: Radiation Oncology

## 2015-07-13 ENCOUNTER — Telehealth: Payer: Self-pay | Admitting: Genetic Counselor

## 2015-07-13 VITALS — BP 140/90 | HR 63 | Temp 98.4°F | Resp 18 | Ht 62.5 in | Wt 154.5 lb

## 2015-07-13 DIAGNOSIS — Z510 Encounter for antineoplastic radiation therapy: Secondary | ICD-10-CM | POA: Diagnosis not present

## 2015-07-13 DIAGNOSIS — Z1379 Encounter for other screening for genetic and chromosomal anomalies: Secondary | ICD-10-CM

## 2015-07-13 DIAGNOSIS — C50411 Malignant neoplasm of upper-outer quadrant of right female breast: Secondary | ICD-10-CM

## 2015-07-13 MED ORDER — RADIAPLEXRX EX GEL
Freq: Once | CUTANEOUS | Status: AC
  Administered 2015-07-13: 19:00:00 via TOPICAL

## 2015-07-13 MED ORDER — ALRA NON-METALLIC DEODORANT (RAD-ONC)
1.0000 "application " | Freq: Once | TOPICAL | Status: AC
  Administered 2015-07-13: 1 via TOPICAL

## 2015-07-13 NOTE — Progress Notes (Signed)
Pt here for patient teaching.  Pt given Radiation and You booklet, skin care instructions, Alra deodorant and Radiaplex gel. Reviewed areas of pertinence such as fatigue and skin changes . Pt able to give teach back of to pat skin and use unscented/gentle soap,apply Radiaplex bid and avoid applying anything to skin within 4 hours of treatment. Pt demonstrated understanding and verbalizes understanding of information given and will contact nursing with any questions or concerns.        

## 2015-07-13 NOTE — Progress Notes (Signed)
Weekly Management Note:  Site: right breast Current Dose:   266  cGy Projected Dose:  4256  cGy  Narrative: The patient is seen today for routine under treatment assessment. CBCT/MVCT images/port films were reviewed. The chart was reviewed.    She begins radiation therapy today. She underwent patient education.  She is without complaints today.  Physical Examination:  Filed Vitals:   07/13/15 1831  BP: 140/90  Pulse: 63  Temp: 98.4 F (36.9 C)  Resp: 18  .  Weight: 154 lb 8 oz (70.081 kg).  She is not examined today.  Impression: Tolerating radiation therapy well.  Plan: Continue radiation therapy as planned.

## 2015-07-13 NOTE — Telephone Encounter (Signed)
Discussed with Eileen [place] that her genetic test results were negative for pathogenic mutations within any of 20 genes that would cause her to be at an increased risk for breast, ovarian, or other related cancers.  Additionally, no uncertain changes were found.  Discussed that this is probably a reassuring result for [place], but that we can never totally rule out a genetic cause since we cannot be sure we are testing all the right genes at this point in time.  Discussed that Eileen [place]'s sister, Eileen [place], who had negative BRCA testing in 2007 should have updated genetic counseling and testing, if she is interested.  It could be the case that her breast cancer is explained by a genetic mutation that Eileen [place] herself did not inherit.  Eileen [place] has already mentioned this to her sister and, since [place] lives in [place], I let her know that she could pass along my contact information and that Eileen [place] could give me a call to set up an appointment at any time.  We discussed that women in the family are still considered to be at a somewhat increased risk for breast cancer based on the history, so Eileen [place]'s sister who has not had breast cancer and is in her 69s, should make her doctor aware of the family history.  Since both of her sister have had cancer, she may be eligible for additional breast cancer screening, such as breast MRI.  Discussed that Eileen [place]'s daughter and nieces should begin annual mammogram screening at the age of 32 based on Eileen [place]'s diagnosis at 84.  Eileen [place] is happy to hear this news and she knows she is welcome to call me with any further questions.

## 2015-07-13 NOTE — Progress Notes (Signed)
GENETIC TEST RESULT  HPI: Eileen [place] was previously seen in the [facility] due to a personal history of breast cancer, family history of breast and other cancers, and concerns regarding a hereditary predisposition to cancer. Please refer to our prior cancer genetics clinic note from June 30, 2015 for more information regarding Eileen [place]'s medical, social and family histories, and our assessment and recommendations, at the time. Eileen [place] recent genetic test results were disclosed to her, as were recommendations warranted by these results. These results and recommendations are discussed in more detail below.  GENETIC TEST RESULTS: At the time of Eileen [place]'s visit on 06/30/15, we recommended she pursue genetic testing of the 20-gene Breast/Ovarian Cancer Panel through [facility].  The Breast/Ovarian Cancer Panel offered by [facility] Eileen Pigeon, MD) includes sequencing and deletion/duplication analysis for the following 19 genes:  ATM, BARD1, BRCA1, BRCA2, BRIP1, CDH1, CHEK2, FANCC, MLH1, MSH2, MSH6, NBN, PALB2, PMS2, PTEN, RAD51C, RAD51D, TP53, and XRCC2.  This panel also includes deletion/duplication analysis (without sequencing) for one gene, EPCAM.  Those results are now back, the report date for which is July 08, 2015.  Genetic testing was normal, and did not reveal a deleterious mutation in these genes.  Additionally, no variants of uncertain significance (VUSes) were found.  The test report will be scanned into EPIC and will be located under the Results Review tab in the Pathology>Molecular Pathology section.   We discussed with Eileen [place] that since the current genetic testing is not perfect, it is possible there may be a gene mutation in one of these genes that current testing cannot detect, but that chance is small. We also discussed, that it is possible that another gene that has not yet been discovered, or that we have not yet tested, is  responsible for the cancer diagnoses in the family, and it is, therefore, important to remain in touch with cancer genetics in the future so that we can continue to offer Eileen [place] the most up to date genetic testing.     CANCER SCREENING RECOMMENDATIONS: While we still do not have an explanation for the personal or family history of cancer, this result is reassuring and indicates that Eileen [place] likely does not have an increased risk for a future cancer due to a mutation in one of these genes. This normal test also suggests that Eileen [place]'s cancer was most likely not due to an inherited predisposition associated with one of these genes.  Most cancers happen by chance and this negative test suggests that her cancer falls into this category.  We, therefore, recommended she continue to follow the cancer management and screening guidelines provided by her oncology and primary healthcare providers.   RECOMMENDATIONS FOR FAMILY MEMBERS: Women in this family might be at some increased risk of developing cancer, over the general population risk, simply due to the family history of cancer. We recommended women in this family have a yearly mammogram beginning at age 69, or 54 years younger than the earliest onset of cancer, an an annual clinical breast exam, and perform monthly breast self-exams. Based on Eileen [place] sister's age of breast cancer diagnosis, Eileen [place]'s daughter and her nieces could likely begin annual mammogram screening at the age of 62.  Eileen [place] sister who has not been diagnosed with breast cancer and who is currently in her 24s, should make her primary doctor aware of the family history.  Since both of her sisters have been diagnosed with breast cancer,  she may be eligible for additional breast cancer screening.  Women in this family should also have a gynecological exam as recommended by their primary provider. All family members should have a colonoscopy by age 70.  Based on Eileen [place]'s  family history, we recommended her sister, Eileen [place], who was diagnosed with breast cancer at age 54, have genetic counseling and testing. This sister had negative BRCA testing in 2007, but would be eligible for updated genetic testing.  Her results may give [place] more information about the personal and familial cancer risks.  This sister lives in [place] and Eileen [place] has already passed this information along to her.  Eileen [place] will let us know if we can be of any assistance in coordinating genetic counseling and/or testing for this family member.   FOLLOW-UP: Lastly, we discussed with Eileen [place] that cancer genetics is a rapidly advancing field and it is possible that new genetic tests will be appropriate for her and/or her family members in the future. We encouraged her to remain in contact with cancer genetics on an annual basis so we can update her personal and family histories and let her know of advances in cancer genetics that may benefit this family.   Our contact number was provided. Eileen [place] questions were answered to her satisfaction, and she knows she is welcome to call us at anytime with additional questions or concerns.   Jeanine Luz, MS Genetic Counselor [email or web address] Phone: [phone]

## 2015-07-13 NOTE — Progress Notes (Signed)
Eileen [place] has completed 1 fraction to her right breast.  She does not have any complaints.

## 2015-07-14 ENCOUNTER — Ambulatory Visit
Admission: RE | Admit: 2015-07-14 | Discharge: 2015-07-14 | Disposition: A | Discharge status: Home / Self Care | Payer: Managed Care, Other (non HMO) | Source: Ambulatory Visit | Attending: Radiation Oncology | Admitting: Radiation Oncology

## 2015-07-14 DIAGNOSIS — Z510 Encounter for antineoplastic radiation therapy: Secondary | ICD-10-CM | POA: Diagnosis not present

## 2015-07-15 ENCOUNTER — Ambulatory Visit
Admission: RE | Admit: 2015-07-15 | Discharge: 2015-07-15 | Disposition: A | Discharge status: Home / Self Care | Payer: Managed Care, Other (non HMO) | Source: Ambulatory Visit | Attending: Radiation Oncology | Admitting: Radiation Oncology

## 2015-07-15 DIAGNOSIS — Z510 Encounter for antineoplastic radiation therapy: Secondary | ICD-10-CM | POA: Diagnosis not present

## 2015-07-16 ENCOUNTER — Encounter: Payer: Managed Care, Other (non HMO) | Admitting: Genetic Counselor

## 2015-07-16 ENCOUNTER — Other Ambulatory Visit: Payer: Managed Care, Other (non HMO)

## 2015-07-16 ENCOUNTER — Ambulatory Visit
Admission: RE | Admit: 2015-07-16 | Discharge: 2015-07-16 | Disposition: A | Discharge status: Home / Self Care | Payer: Managed Care, Other (non HMO) | Source: Ambulatory Visit | Attending: Radiation Oncology | Admitting: Radiation Oncology

## 2015-07-16 DIAGNOSIS — Z510 Encounter for antineoplastic radiation therapy: Secondary | ICD-10-CM | POA: Diagnosis not present

## 2015-07-17 ENCOUNTER — Ambulatory Visit
Admission: RE | Admit: 2015-07-17 | Discharge: 2015-07-17 | Disposition: A | Discharge status: Home / Self Care | Payer: Managed Care, Other (non HMO) | Source: Ambulatory Visit | Attending: Radiation Oncology | Admitting: Radiation Oncology

## 2015-07-17 DIAGNOSIS — Z510 Encounter for antineoplastic radiation therapy: Secondary | ICD-10-CM | POA: Diagnosis not present

## 2015-07-20 ENCOUNTER — Encounter: Payer: Self-pay | Admitting: Radiation Oncology

## 2015-07-20 ENCOUNTER — Telehealth: Payer: Self-pay | Admitting: *Deleted

## 2015-07-20 ENCOUNTER — Ambulatory Visit
Admission: RE | Admit: 2015-07-20 | Discharge: 2015-07-20 | Disposition: A | Discharge status: Home / Self Care | Payer: Managed Care, Other (non HMO) | Source: Ambulatory Visit | Attending: Radiation Oncology | Admitting: Radiation Oncology

## 2015-07-20 VITALS — BP 117/67 | HR 64 | Temp 98.2°F | Resp 20 | Wt 158.8 lb

## 2015-07-20 DIAGNOSIS — Z510 Encounter for antineoplastic radiation therapy: Secondary | ICD-10-CM | POA: Diagnosis not present

## 2015-07-20 DIAGNOSIS — C50411 Malignant neoplasm of upper-outer quadrant of right female breast: Secondary | ICD-10-CM

## 2015-07-20 NOTE — Progress Notes (Signed)
Weekly Management Note:  Site: right breast Current Dose:   1596  cGy Projected Dose:  4256  cGy  Narrative: The patient is seen today for routine under treatment assessment. CBCT/MVCT images/port films were reviewed. The chart was reviewed.    Her major complaint is that of continued numbness and discomfort along her right nipple an upper outer quadrant along her tumor bed.  This has been present since her surgery. No real skin discomfort.  She has not yet started Radioplex gel. She is not taking any pain medication.  Physical Examination:  Filed Vitals:   07/20/15 1536  BP: 117/67  Pulse: 64  Temp: 98.2 F (36.8 C)  Resp: 20  .  Weight: 158 lb 12.8 oz (72.031 kg).  There are no significant skin changes.  There is discomfort described along the upper-outer quadrant of her right breast and nipple.  No masses are appreciated.  Impression: Tolerating radiation therapy well.  I suspect her pain is neuropathic , and hopefully will improve the near future.  Plan: Continue radiation therapy as planned.

## 2015-07-20 NOTE — Telephone Encounter (Signed)
Spoke with patient to follow up after start of radiation.  She states she is doing well.  Encouraged her to call with any needs or concerns. 

## 2015-07-20 NOTE — Progress Notes (Addendum)
Weekly rd txs right breast  6/16 completed, slight swelling stated, no skin changes,  Not using radiaplex yet, has at home, numbness at nipple and side breast,  Vitals wnl, appetite good, energy level is fair, mild tiredness BP 117/67 mmHg  Pulse 64  Temp(Src) 98.2 F (36.8 C) (Oral)  Resp 20  Wt 158 lb 12.8 oz (72.031 kg)  LMP 02/21/2011  Wt Readings from Last 3 Encounters:  07/20/15 158 lb 12.8 oz (72.031 kg)  07/13/15 154 lb 8 oz (70.081 kg)  06/23/15 155 lb 1.6 oz (70.353 kg)

## 2015-07-21 ENCOUNTER — Ambulatory Visit
Admission: RE | Admit: 2015-07-21 | Discharge: 2015-07-21 | Disposition: A | Discharge status: Home / Self Care | Payer: Managed Care, Other (non HMO) | Source: Ambulatory Visit | Attending: Radiation Oncology | Admitting: Radiation Oncology

## 2015-07-21 DIAGNOSIS — Z510 Encounter for antineoplastic radiation therapy: Secondary | ICD-10-CM | POA: Diagnosis not present

## 2015-07-22 ENCOUNTER — Ambulatory Visit
Admission: RE | Admit: 2015-07-22 | Discharge: 2015-07-22 | Disposition: A | Discharge status: Home / Self Care | Payer: Managed Care, Other (non HMO) | Source: Ambulatory Visit | Attending: Radiation Oncology | Admitting: Radiation Oncology

## 2015-07-22 DIAGNOSIS — Z510 Encounter for antineoplastic radiation therapy: Secondary | ICD-10-CM | POA: Diagnosis not present

## 2015-07-23 ENCOUNTER — Ambulatory Visit
Admission: RE | Admit: 2015-07-23 | Discharge: 2015-07-23 | Disposition: A | Discharge status: Home / Self Care | Payer: Managed Care, Other (non HMO) | Source: Ambulatory Visit | Attending: Radiation Oncology | Admitting: Radiation Oncology

## 2015-07-23 DIAGNOSIS — Z510 Encounter for antineoplastic radiation therapy: Secondary | ICD-10-CM | POA: Diagnosis not present

## 2015-07-24 ENCOUNTER — Ambulatory Visit
Admission: RE | Admit: 2015-07-24 | Discharge: 2015-07-24 | Disposition: A | Discharge status: Home / Self Care | Payer: Managed Care, Other (non HMO) | Source: Ambulatory Visit | Attending: Radiation Oncology | Admitting: Radiation Oncology

## 2015-07-24 DIAGNOSIS — Z510 Encounter for antineoplastic radiation therapy: Secondary | ICD-10-CM | POA: Diagnosis not present

## 2015-07-27 ENCOUNTER — Ambulatory Visit
Admission: RE | Admit: 2015-07-27 | Discharge: 2015-07-27 | Disposition: A | Discharge status: Home / Self Care | Payer: Managed Care, Other (non HMO) | Source: Ambulatory Visit | Attending: Radiation Oncology | Admitting: Radiation Oncology

## 2015-07-27 ENCOUNTER — Encounter: Payer: Self-pay | Admitting: Radiation Oncology

## 2015-07-27 VITALS — BP 131/79 | HR 55 | Temp 98.0°F | Ht 62.5 in | Wt 156.0 lb

## 2015-07-27 DIAGNOSIS — C50411 Malignant neoplasm of upper-outer quadrant of right female breast: Secondary | ICD-10-CM

## 2015-07-27 DIAGNOSIS — Z510 Encounter for antineoplastic radiation therapy: Secondary | ICD-10-CM | POA: Diagnosis not present

## 2015-07-27 NOTE — Progress Notes (Signed)
Eileen [place] has received 11 fractions to her right breast.  Note mild hyperpigmentation in the files.  She reports that the seroma on the right lateral breast has increased in size and is tender to touch. Experiencing "shooting nipple pain", intermittent in nature.

## 2015-07-27 NOTE — Progress Notes (Signed)
Weekly Management Note:  Site: right breast Current Dose:   2926  cGy Projected Dose:  4256  cGy  Narrative: The patient is seen today for routine under treatment assessment. CBCT/MVCT images/port films were reviewed. The chart was reviewed.    She is without new complaints today. She continues to have  intermittent shooting pains within the right breast. She feels that her right breast seroma is better defined.  Physical Examination:  Filed Vitals:   07/27/15 1640  BP: 131/79  Pulse: 55  Temp: 98 F (36.7 C)  .  Weight: 156 lb (70.761 kg).  There is mild hyperpigmentation the skin along the right breast. Her seroma is better defined but unchanged in size. No areas of desquamation.  Impression: Tolerating radiation therapy well.  I suspect that her seroma is more organized, and therefore were evident to the patient. I defer to Dr. Brantley Stage as to whether not he would consider gabapentin if her right breast discomfort becomes more severe.   I will also get her set up to see Dr. Burney Gauze for discussion  antiestrogen therapy since her DCIS was ER/PR positive.  Plan: Continue radiation therapy as planned. She will finish radiation therapy in one week.

## 2015-07-28 ENCOUNTER — Ambulatory Visit
Admission: RE | Admit: 2015-07-28 | Discharge: 2015-07-28 | Disposition: A | Discharge status: Home / Self Care | Payer: Managed Care, Other (non HMO) | Source: Ambulatory Visit | Attending: Radiation Oncology | Admitting: Radiation Oncology

## 2015-07-28 DIAGNOSIS — Z510 Encounter for antineoplastic radiation therapy: Secondary | ICD-10-CM | POA: Diagnosis not present

## 2015-07-29 ENCOUNTER — Ambulatory Visit
Admission: RE | Admit: 2015-07-29 | Discharge: 2015-07-29 | Disposition: A | Discharge status: Home / Self Care | Payer: Managed Care, Other (non HMO) | Source: Ambulatory Visit | Attending: Radiation Oncology | Admitting: Radiation Oncology

## 2015-07-29 DIAGNOSIS — Z510 Encounter for antineoplastic radiation therapy: Secondary | ICD-10-CM | POA: Diagnosis not present

## 2015-07-30 ENCOUNTER — Ambulatory Visit
Admission: RE | Admit: 2015-07-30 | Discharge: 2015-07-30 | Disposition: A | Discharge status: Home / Self Care | Payer: Managed Care, Other (non HMO) | Source: Ambulatory Visit | Attending: Radiation Oncology | Admitting: Radiation Oncology

## 2015-07-30 DIAGNOSIS — Z510 Encounter for antineoplastic radiation therapy: Secondary | ICD-10-CM | POA: Diagnosis not present

## 2015-07-31 ENCOUNTER — Ambulatory Visit
Admission: RE | Admit: 2015-07-31 | Discharge: 2015-07-31 | Disposition: A | Discharge status: Home / Self Care | Payer: Managed Care, Other (non HMO) | Source: Ambulatory Visit | Attending: Radiation Oncology | Admitting: Radiation Oncology

## 2015-07-31 DIAGNOSIS — Z510 Encounter for antineoplastic radiation therapy: Secondary | ICD-10-CM | POA: Diagnosis not present

## 2015-08-03 ENCOUNTER — Other Ambulatory Visit: Payer: Self-pay | Admitting: *Deleted

## 2015-08-03 ENCOUNTER — Ambulatory Visit
Admission: RE | Admit: 2015-08-03 | Payer: Managed Care, Other (non HMO) | Source: Ambulatory Visit | Admitting: Radiation Oncology

## 2015-08-03 ENCOUNTER — Encounter: Payer: Self-pay | Admitting: *Deleted

## 2015-08-03 ENCOUNTER — Ambulatory Visit
Admission: RE | Admit: 2015-08-03 | Discharge: 2015-08-03 | Disposition: A | Discharge status: Home / Self Care | Payer: Managed Care, Other (non HMO) | Source: Ambulatory Visit | Attending: Radiation Oncology | Admitting: Radiation Oncology

## 2015-08-03 DIAGNOSIS — Z510 Encounter for antineoplastic radiation therapy: Secondary | ICD-10-CM | POA: Diagnosis not present

## 2015-08-03 DIAGNOSIS — C50411 Malignant neoplasm of upper-outer quadrant of right female breast: Secondary | ICD-10-CM

## 2015-08-03 MED ORDER — RADIAPLEXRX EX GEL
Freq: Once | CUTANEOUS | Status: AC
  Administered 2015-08-03: 17:00:00 via TOPICAL

## 2015-08-04 ENCOUNTER — Encounter: Payer: Self-pay | Admitting: Radiation Oncology

## 2015-08-04 ENCOUNTER — Ambulatory Visit: Payer: Managed Care, Other (non HMO)

## 2015-08-04 NOTE — Progress Notes (Signed)
[facility] End of Treatment Note  Name:Chaniqua Imperato  Date: 08/04/2015 [id number] DOB:13-Jul-1961   Status:outpatient    CC: Suzanna Obey, MD  Dr. Erroll Luna  REFERRING PHYSICIAN: Dr. 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  She had right desquamation the skin but no areas of moist desquamation on completion of therapy.                          PLAN: Routine followup in one month with Dr. Sondra Come. Patient instructed to call if questions or worsening complaints in interim.

## 2015-08-05 ENCOUNTER — Ambulatory Visit: Payer: Managed Care, Other (non HMO)

## 2015-08-06 ENCOUNTER — Ambulatory Visit: Payer: Managed Care, Other (non HMO)

## 2015-08-07 ENCOUNTER — Ambulatory Visit: Payer: Managed Care, Other (non HMO)

## 2015-08-11 ENCOUNTER — Ambulatory Visit: Payer: Managed Care, Other (non HMO)

## 2015-08-18 ENCOUNTER — Other Ambulatory Visit: Payer: Self-pay | Admitting: Adult Health

## 2015-08-18 DIAGNOSIS — C50411 Malignant neoplasm of upper-outer quadrant of right female breast: Secondary | ICD-10-CM

## 2015-08-20 ENCOUNTER — Telehealth: Payer: Self-pay | Admitting: Nurse Practitioner

## 2015-08-20 ENCOUNTER — Telehealth: Payer: Self-pay | Admitting: *Deleted

## 2015-08-20 NOTE — Telephone Encounter (Signed)
Spoke with patient re 2/21 SCP visit in at [facility] at [facility].

## 2015-08-20 NOTE — Telephone Encounter (Signed)
CALLED PATIENT TO INFORM THAT DR. KINARD WOULD BE WILLING TO SEE HER ON 08-24-15 @ 5 PM, PATIENT AGREED TO THIS DAY AND TIME

## 2015-08-24 ENCOUNTER — Telehealth: Payer: Self-pay | Admitting: Oncology

## 2015-08-24 ENCOUNTER — Encounter: Payer: Self-pay | Admitting: Oncology

## 2015-08-24 ENCOUNTER — Ambulatory Visit: Payer: Managed Care, Other (non HMO) | Admitting: Radiation Oncology

## 2015-08-24 NOTE — Telephone Encounter (Signed)
Called Rosetta to see if she would be able to come in earlier for her follow up appointment today.  Haiden said she is going to try to come in at 1 pm but has to arrange for transportation.  She will call if she in not able to make it in at 1 pm.

## 2015-08-26 ENCOUNTER — Ambulatory Visit
Admission: RE | Admit: 2015-08-26 | Discharge: 2015-08-26 | Disposition: A | Discharge status: Home / Self Care | Payer: Managed Care, Other (non HMO) | Source: Ambulatory Visit | Attending: Radiation Oncology | Admitting: Radiation Oncology

## 2015-08-26 ENCOUNTER — Encounter: Payer: Self-pay | Admitting: Radiation Oncology

## 2015-08-26 VITALS — BP 159/84 | HR 65 | Temp 98.0°F | Ht 62.5 in | Wt 155.7 lb

## 2015-08-26 DIAGNOSIS — C50411 Malignant neoplasm of upper-outer quadrant of right female breast: Secondary | ICD-10-CM | POA: Diagnosis not present

## 2015-08-26 MED ORDER — RADIAPLEXRX EX GEL
Freq: Once | CUTANEOUS | Status: AC
  Administered 2015-08-26: 17:00:00 via TOPICAL

## 2015-08-26 NOTE — Progress Notes (Signed)
Ms. Teller here for reassessment s/p XRT to her right breast which completed on 08/03/15.  She has a seroma on the lateral side of her right breast.  She c/o intermittent pain in this areas.  Hyperpigmentation present with dry skin and continues to use Radiaplex Gel.

## 2015-08-26 NOTE — Progress Notes (Signed)
Radiation Oncology         [phone] ________________________________  Name: Eileen Holland MRN: [id number]  Date: 08/26/2015  DOB: 1960-08-28  Follow-Up Visit Note  CC: Suzanna Obey, MD  Erroll Luna, MD    ICD-9-CM ICD-10-CM   1. 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     Interval Since Last Radiation:  3   weeks. Completed 07/13/2015 - 08/03/2015 to the right breast with 4256 cGy in 16 sessions. Patient was treated under the direction of Dr. Arloa Koh.  Narrative:  The patient returns today for routine follow-up.  She continues to have pain in the right breast related to her seroma. This is making it so she is unable to work. The patient is been out of work since 08/05/2015. She denies any nipple discharge or bleeding. The patient did follow-up with her surgeon since completion of radiation therapy concerning her seroma. According to patient no aspiration was performed.                        ALLERGIES:  is allergic to aspirin and ibuprofen.  Meds: Current Outpatient Prescriptions  Medication Sig Dispense Refill  . ACETAMINOPHEN-BUTALBITAL 50-325 MG TABS Take 1 tablet by mouth every 4 (four) hours as needed (headache.).    Marland Kitchen ALPRAZolam (XANAX) 0.5 MG tablet Take 0.5 mg by mouth 2 (two) times daily as needed for anxiety.    Marland Kitchen amLODipine (NORVASC) 10 MG tablet Take 10 mg by mouth every evening. @@7 :30 pm    . B Complex Vitamins (B COMPLEX PO) Take 1 tablet by mouth daily at 12 noon. (B complex-C-E-zinc)    . fluticasone (VERAMYST) 27.5 MCG/SPRAY nasal spray Place 2 sprays into the nose every morning.     . hyaluronate sodium (RADIAPLEXRX) GEL Apply 1 application topically 2 (two) times daily.    . hydrochlorothiazide 25 MG tablet Take 25 mg by mouth every morning.     Marland Kitchen ketoconazole (NIZORAL) 2 % cream Apply 1 application topically daily as needed for irritation.    Marland Kitchen  levocetirizine (XYZAL) 5 MG tablet Take 5 mg by mouth daily at 12 noon. For allergies     . losartan (COZAAR) 100 MG tablet Take 100 mg by mouth every morning.    . metoprolol succinate (TOPROL-XL) 100 MG 24 hr tablet Take 100 mg by mouth every morning. Take with or immediately following a meal.    . non-metallic deodorant (ALRA) MISC Apply 1 application topically daily as needed.    Marland Kitchen PARoxetine (PAXIL) 20 MG tablet Take 20 mg by mouth every morning.      . Prenatal Vit-Fe Fumarate-FA (PRENATAL PO) Take 1 tablet by mouth daily at 12 noon.     No current facility-administered medications for this encounter.    Physical Findings: The patient is in no acute distress. Patient is alert and oriented.  height is 5' 2.5" (1.588 m) and weight is 155 lb 11.2 oz (70.625 kg). Her temperature is 98 F (36.7 C). Her blood pressure is 159/84 and her pulse is 65. Marland Kitchen  No palpable subclavicular or axillary adenopathy. The lungs are clear to auscultation. The heart has a regular rhythm and rate. Examination of left breast reveals no palpable mass or nipple discharge. The right breast area shows hyperpigmentation changes and some mild dry desquamation. The patient has a palpable  seroma in the upper outer quadrant estimated to be approximately 5 x 5 cm in size is tender with palpation in this area. No nipple discharge or bleeding noted.   Lab Findings: Lab Results  Component Value Date   WBC 20.7* 07/18/2014   HGB 11.2* 07/18/2014   HCT 33.1* 07/18/2014   MCV 103.8* 07/18/2014   PLT 215 07/18/2014    Radiographic Findings: No results found.  Impression:  The patient is recovering from the effects of radiation.  persistent pain related to seroma.  Plan: Patient will be taken out of work until Feb. 1,  2017. She will be seen  for follow-up a week prior to this to assess her improvement. Patient will continue to take  to butalbital/acetaminophen for her  pain.  ____________________________________ -----------------------------------  Blair Promise, PhD, MD    This document serves as a record of services personally performed by Gery Pray, MD. It was created on his behalf by Lendon Collar, a trained medical scribe. The creation of this record is based on the scribe's personal observations and the provider's statements to them. This document has been checked and approved by the attending provider.

## 2015-08-26 NOTE — Addendum Note (Signed)
Encounter addended by: Benn Moulder, RN on: 08/26/2015  6:29 PM<BR>     Documentation filed: [employer], Inpatient MAR, Orders

## 2015-08-26 NOTE — Addendum Note (Signed)
Encounter addended by: Benn Moulder, RN on: 08/26/2015  6:29 PM<BR>     Documentation filed: Orders, [employer], Inpatient [facility]

## 2015-08-27 ENCOUNTER — Encounter: Payer: Self-pay | Admitting: Oncology

## 2015-09-07 ENCOUNTER — Other Ambulatory Visit: Payer: Self-pay | Admitting: *Deleted

## 2015-09-07 ENCOUNTER — Ambulatory Visit (HOSPITAL_BASED_OUTPATIENT_CLINIC_OR_DEPARTMENT_OTHER): Payer: Managed Care, Other (non HMO) | Admitting: Hematology & Oncology

## 2015-09-07 ENCOUNTER — Ambulatory Visit (HOSPITAL_BASED_OUTPATIENT_CLINIC_OR_DEPARTMENT_OTHER)
Admission: RE | Admit: 2015-09-07 | Discharge: 2015-09-07 | Disposition: A | Discharge status: Home / Self Care | Payer: Managed Care, Other (non HMO) | Source: Ambulatory Visit | Attending: Hematology & Oncology | Admitting: Hematology & Oncology

## 2015-09-07 ENCOUNTER — Other Ambulatory Visit (HOSPITAL_BASED_OUTPATIENT_CLINIC_OR_DEPARTMENT_OTHER): Payer: Managed Care, Other (non HMO)

## 2015-09-07 ENCOUNTER — Encounter: Payer: Self-pay | Admitting: Hematology & Oncology

## 2015-09-07 VITALS — BP 175/99 | HR 51 | Temp 97.9°F | Resp 16 | Ht 62.0 in | Wt 154.0 lb

## 2015-09-07 DIAGNOSIS — R0789 Other chest pain: Secondary | ICD-10-CM

## 2015-09-07 DIAGNOSIS — F17200 Nicotine dependence, unspecified, uncomplicated: Secondary | ICD-10-CM | POA: Insufficient documentation

## 2015-09-07 DIAGNOSIS — D0510 Intraductal carcinoma in situ of unspecified breast: Secondary | ICD-10-CM | POA: Diagnosis not present

## 2015-09-07 DIAGNOSIS — C50411 Malignant neoplasm of upper-outer quadrant of right female breast: Secondary | ICD-10-CM

## 2015-09-07 DIAGNOSIS — J439 Emphysema, unspecified: Secondary | ICD-10-CM | POA: Insufficient documentation

## 2015-09-07 DIAGNOSIS — R071 Chest pain on breathing: Secondary | ICD-10-CM | POA: Diagnosis not present

## 2015-09-07 LAB — CBC WITH DIFFERENTIAL (CANCER CENTER ONLY)
BASO#: 0 10*3/uL (ref 0.0–0.2)
BASO%: 0.3 % (ref 0.0–2.0)
EOS%: 2.3 % (ref 0.0–7.0)
Eosinophils Absolute: 0.2 10*3/uL (ref 0.0–0.5)
HEMATOCRIT: 38.8 % (ref 34.8–46.6)
HGB: 13.3 g/dL (ref 11.6–15.9)
LYMPH#: 1.2 10*3/uL (ref 0.9–3.3)
LYMPH%: 13.8 % — ABNORMAL LOW (ref 14.0–48.0)
MCH: 35.5 pg — ABNORMAL HIGH (ref 26.0–34.0)
MCHC: 34.3 g/dL (ref 32.0–36.0)
MCV: 104 fL — ABNORMAL HIGH (ref 81–101)
MONO#: 0.5 10*3/uL (ref 0.1–0.9)
MONO%: 5.7 % (ref 0.0–13.0)
NEUT#: 6.8 10*3/uL — ABNORMAL HIGH (ref 1.5–6.5)
NEUT%: 77.9 % (ref 39.6–80.0)
PLATELETS: 216 10*3/uL (ref 145–400)
RBC: 3.75 10*6/uL (ref 3.70–5.32)
RDW: 11.2 % (ref 11.1–15.7)
WBC: 8.7 10*3/uL (ref 3.9–10.0)

## 2015-09-07 LAB — CMP (CANCER CENTER ONLY)
ALBUMIN: 3.5 g/dL (ref 3.3–5.5)
ALT: 13 U/L (ref 10–47)
AST: 20 U/L (ref 11–38)
Alkaline Phosphatase: 64 U/L (ref 26–84)
BILIRUBIN TOTAL: 0.5 mg/dL (ref 0.20–1.60)
BUN, Bld: 7 mg/dL (ref 7–22)
CO2: 29 meq/L (ref 18–33)
CREATININE: 1 mg/dL (ref 0.6–1.2)
Calcium: 9.5 mg/dL (ref 8.0–10.3)
Chloride: 98 mEq/L (ref 98–108)
Glucose, Bld: 99 mg/dL (ref 73–118)
Potassium: 3.7 mEq/L (ref 3.3–4.7)
SODIUM: 139 meq/L (ref 128–145)
TOTAL PROTEIN: 7.4 g/dL (ref 6.4–8.1)

## 2015-09-07 MED ORDER — TAMOXIFEN CITRATE 20 MG PO TABS: 20.0000 mg | ORAL_TABLET | Freq: Every day | ORAL | Status: DC

## 2015-09-07 NOTE — Progress Notes (Signed)
Referral MD  Reason for Referral: DCIS of the RIGHT breast (ER?PR+)  Chief Complaint  Patient presents with  . Follow-up  : I was found have breast cancer my right breast.  HPI: Eileen Holland is known to me. I saw her back in September 2015. At that point time, she had some macrocytic anemia. Her bloods was not all that bad. All her studies came back okay.  She had a mammogram done in 2016. This was in August. She had additional studies done. She ultimately had a biopsy done. The pathology report ([id number]) showed fibrocystic changes but no obvious breast cancer.  She then underwent lumpectomy. This was done on 06/09/2015. The pathology report  ([id number]) showed ductal carcinoma in situ. It was intermediate grade. It measured 0.4 cm. All margins were negative. No lymph nodes were sampled. The tumor was ER positive and PR positive.   She underwent radiation therapy. She completed treatments on 08/03/2015. She received 4256 cGy in 16 fractions. There is no boost.  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 She's still having some pain. I told her that is probably would last for about 3-4 months.  She now was referred to the [facility] to see about systemic therapy to help prevent recurrence.  She is still having her "cycles". She has had a hysterectomy. Her ovaries are in place. She is getting some hot flashes.  She's had no fever. She's had no change in bowel or bladder habits. She has had no cough.   She is complaining of some chest discomfort. We will go ahead and get a chest x-ray on her. She does smoke. Overall, her performance status is ECOG 1.  She said that she was tested for genetic mutations. Her sister had breast cancer. She says that her genetic studies were normal.     Past Medical History  Diagnosis Date  . Hypertension   . Depression   . Allergy   . Headache     hx of per PCP history  . Anxiety     panic attacks per PCP history  . Headache,  migraine 07/04/2014    Last Assessment & Plan:  Refilled butalbital for rare use, once monthly as cannot take NSAIDs.  Advised if needs it more regularly, to follow-up and discuss management furtehr   . Breast calcification, right   . Radiation 07/13/15-08/03/15    right breast 4256 cGy  :  Past Surgical History  Procedure Laterality Date  . Knee arthroscopy Right 2000  . Vaginal hysterectomy  2012  . Colonscopy     . Breast lumpectomy with radioactive seed localization Right 06/09/2015    Procedure: RIGHT BREAST LUMPECTOMY WITH RADIOACTIVE SEED LOCALIZATION;  Surgeon: Erroll Luna, MD;  Location: [facility];  Service: General;  Laterality: Right;  :   Current outpatient prescriptions:  .  ACETAMINOPHEN-BUTALBITAL 50-325 MG TABS, Take 1 tablet by mouth every 4 (four) hours as needed (headache.)., Disp: , Rfl:  .  ALPRAZolam (XANAX) 0.5 MG tablet, Take 0.5 mg by mouth 2 (two) times daily as needed for anxiety., Disp: , Rfl:  .  amLODipine (NORVASC) 10 MG tablet, Take 10 mg by mouth every evening. @'@7' :30 pm, Disp: , Rfl:  .  B Complex Vitamins (B COMPLEX PO), Take 1 tablet by mouth daily at 12 noon. (B complex-C-E-zinc), Disp: , Rfl:  .  fluticasone (VERAMYST) 27.5 MCG/SPRAY nasal spray, Place 2 sprays into the nose every morning. , Disp: , Rfl:  .  hyaluronate sodium (RADIAPLEXRX) GEL, Apply 1 application topically 2 (two) times daily., Disp: , Rfl:  .  hydrochlorothiazide 25 MG tablet, Take 25 mg by mouth every morning. , Disp: , Rfl:  .  ketoconazole (NIZORAL) 2 % cream, Apply 1 application topically daily as needed for irritation., Disp: , Rfl:  .  levocetirizine (XYZAL) 5 MG tablet, Take 5 mg by mouth daily at 12 noon. For allergies , Disp: , Rfl:  .  losartan (COZAAR) 100 MG tablet, Take 100 mg by mouth every morning., Disp: , Rfl:  .  metoprolol succinate (TOPROL-XL) 100 MG 24 hr tablet, Take 100 mg by mouth every morning. Take with or immediately following a meal.,  Disp: , Rfl:  .  non-metallic deodorant (ALRA) MISC, Apply 1 application topically daily as needed., Disp: , Rfl:  .  PARoxetine (PAXIL) 20 MG tablet, Take 20 mg by mouth every morning.  , Disp: , Rfl:  .  Prenatal Vit-Fe Fumarate-FA (PRENATAL PO), Take 1 tablet by mouth daily at 12 noon., Disp: , Rfl:  .  tamoxifen (NOLVADEX) 20 MG tablet, Take 1 tablet (20 mg total) by mouth daily., Disp: 30 tablet, Rfl: 5:  :  Allergies  Allergen Reactions  . Aspirin Hives  . Ibuprofen Hives  :  Family History  Problem Relation Age of Onset  . Colon cancer Neg Hx   . Lung cancer Father 28    former smoker  . Colon polyps Father     unspecified number  . Breast cancer Sister 20    negative BRCA testing in 2007  . Stomach cancer Maternal Uncle     dx. 50s-early 59s  . Heart attack Paternal Uncle   :  Social History   Social History  . Marital Status: Single    Spouse Name: N/A  . Number of Children: N/A  . Years of Education: N/A   Occupational History  . Not on file.   Social History Main Topics  . Smoking status: Current Every Day Smoker -- 0.50 packs/day for 32 years    Types: Cigarettes  . Smokeless tobacco: Never Used  . Alcohol Use: 0.0 oz/week    0 Standard drinks or equivalent per week     Comment: occas - maybe 1 drink every 3 mos  . Drug Use: No  . Sexual Activity: Not on file   Other Topics Concern  . Not on file   Social History Narrative  :  Pertinent items are noted in HPI.  Exam: '@IPVITALS' @ well-developed and well-nourished African-American female. Her vital signs are temperature of 97.9. Pulse 51. Blood pressure was 85/99. Weight is 154 pounds. Head and neck exam shows no ocular or oral lesions. Shows no palpable cervical or supraclavicular lymph nodes. Lungs are clear. Cardiac exam regular rate and rhythm with no murmurs, rubs or bruits. Abdomen is soft. She has good bowel sounds. There is no fluid wave. There is no operable liver or spleen tip. Breast exam  shows left breast with no masses, edema or erythema. There is no left axillary adenopathy. Right breast shows the lumpectomy at the 10:00 position in the upper outer quadrant of the right breast. She has hyperpigmentation in the area of the biopsy. There is some hyperpigmentation in the right breast. No dizzy masses noted in the right breast. There is no right axillary adenopathy. Back exam shows no tenderness over the spine, ribs or hips.. Extremities shows no clubbing, cyanosis or edema. She has good range of motion of her  joints. She has good range of motion of her right shoulder. Skin exam shows a radiation dermatitis changes in the left breast. Neurological exam shows no focal neurological deficits.   Recent Labs  09/07/15 1425  WBC 8.7  HGB 13.3  HCT 38.8  PLT 216    Recent Labs  09/07/15 1425  NA 139  K 3.7  CL 98  CO2 29  GLUCOSE 99  BUN 7  CREATININE 1.0  CALCIUM 9.5    Blood smear review:  None   Pathology: See above     Assessment and Plan:  Eileen Holland is a very charming 55 year old African-American female with a ductal carcinoma in situ of the right breast. She underwent lumpectomy followed by radiation.  I think she would be a good candidate for preventative therapy. I think tamoxifen would be appropriate for her. I think that she probably still perimenopausal.  I do not her about tamoxifen. I talked her about the risk of blood clots. She cannot take aspirin but that is okay from my point of view. I told her to take a lot of water. She has had a hysterectomy so we do not have to worry about uterine cancer.  Once she is postmenopausal, that we switch her over to an aromatase inhibitor.  I will like to see her back in about 6 weeks. If all is good 6 weeks, then we piker back in 3 months.  I spent about 45 minutes with her. I reviewed the pathology with her. I reassured her that she did not have breast cancer but we do not want her DCIS to recur.

## 2015-09-08 ENCOUNTER — Telehealth: Payer: Self-pay | Admitting: *Deleted

## 2015-09-08 ENCOUNTER — Ambulatory Visit: Payer: Managed Care, Other (non HMO) | Admitting: Radiation Oncology

## 2015-09-08 NOTE — Telephone Encounter (Addendum)
Patient aware of results  ----- Message from Volanda Napoleon, MD sent at 09/08/2015  9:45 AM EST ----- Call - ou have stable emphysema changes. NO pneumonia.  pete

## 2015-09-11 ENCOUNTER — Encounter (HOSPITAL_COMMUNITY): Payer: Self-pay

## 2015-09-11 ENCOUNTER — Telehealth: Payer: Self-pay | Admitting: Oncology

## 2015-09-11 ENCOUNTER — Encounter: Payer: Self-pay | Admitting: *Deleted

## 2015-09-11 NOTE — Telephone Encounter (Signed)
Eileen Holland called and said she saw Dr. Marin Olp this week.  She is wondering if she still needs to follow up with Dr. Sondra Come.  Advised her that Dr. Sondra Come will be contacted on Monday and we will call her back.

## 2015-10-02 ENCOUNTER — Telehealth: Payer: Self-pay | Admitting: Nurse Practitioner

## 2015-10-02 NOTE — Telephone Encounter (Signed)
Returned a call to patient regarding a message she left at [facility] to cancel her 2/21 appointment Eileen Holland). Not able to reach patient and left a message asking that she call office at her convenience to reschedule. Message routed to [facility] and PE.

## 2015-10-06 ENCOUNTER — Encounter: Payer: Managed Care, Other (non HMO) | Admitting: Nurse Practitioner

## 2015-10-19 ENCOUNTER — Other Ambulatory Visit (HOSPITAL_BASED_OUTPATIENT_CLINIC_OR_DEPARTMENT_OTHER): Payer: Managed Care, Other (non HMO)

## 2015-10-19 ENCOUNTER — Ambulatory Visit (HOSPITAL_BASED_OUTPATIENT_CLINIC_OR_DEPARTMENT_OTHER): Payer: Managed Care, Other (non HMO) | Admitting: Family

## 2015-10-19 ENCOUNTER — Encounter: Payer: Self-pay | Admitting: Family

## 2015-10-19 VITALS — BP 142/81 | HR 56 | Temp 98.3°F | Resp 16 | Ht 62.0 in | Wt 155.0 lb

## 2015-10-19 DIAGNOSIS — C50411 Malignant neoplasm of upper-outer quadrant of right female breast: Secondary | ICD-10-CM

## 2015-10-19 DIAGNOSIS — D0511 Intraductal carcinoma in situ of right breast: Secondary | ICD-10-CM

## 2015-10-19 DIAGNOSIS — Z79810 Long term (current) use of selective estrogen receptor modulators (SERMs): Secondary | ICD-10-CM | POA: Diagnosis not present

## 2015-10-19 DIAGNOSIS — Z170 Estrogen receptor positive status [ER+]: Secondary | ICD-10-CM | POA: Diagnosis not present

## 2015-10-19 DIAGNOSIS — R0789 Other chest pain: Secondary | ICD-10-CM

## 2015-10-19 LAB — CBC WITH DIFFERENTIAL (CANCER CENTER ONLY)
BASO#: 0 10*3/uL (ref 0.0–0.2)
BASO%: 0.3 % (ref 0.0–2.0)
EOS%: 0.9 % (ref 0.0–7.0)
Eosinophils Absolute: 0.1 10*3/uL (ref 0.0–0.5)
HCT: 35.4 % (ref 34.8–46.6)
HGB: 12 g/dL (ref 11.6–15.9)
LYMPH#: 1.5 10*3/uL (ref 0.9–3.3)
LYMPH%: 17.3 % (ref 14.0–48.0)
MCH: 35.3 pg — ABNORMAL HIGH (ref 26.0–34.0)
MCHC: 33.9 g/dL (ref 32.0–36.0)
MCV: 104 fL — ABNORMAL HIGH (ref 81–101)
MONO#: 0.5 10*3/uL (ref 0.1–0.9)
MONO%: 5.3 % (ref 0.0–13.0)
NEUT#: 6.8 10*3/uL — ABNORMAL HIGH (ref 1.5–6.5)
NEUT%: 76.2 % (ref 39.6–80.0)
PLATELETS: 235 10*3/uL (ref 145–400)
RBC: 3.4 10*6/uL — AB (ref 3.70–5.32)
RDW: 11.8 % (ref 11.1–15.7)
WBC: 8.9 10*3/uL (ref 3.9–10.0)

## 2015-10-19 LAB — CMP (CANCER CENTER ONLY)
ALK PHOS: 65 U/L (ref 26–84)
ALT: 20 U/L (ref 10–47)
AST: 27 U/L (ref 11–38)
Albumin: 4 g/dL (ref 3.3–5.5)
BUN: 9 mg/dL (ref 7–22)
CO2: 29 mEq/L (ref 18–33)
CREATININE: 1.1 mg/dL (ref 0.6–1.2)
Calcium: 9.4 mg/dL (ref 8.0–10.3)
Chloride: 103 mEq/L (ref 98–108)
Glucose, Bld: 110 mg/dL (ref 73–118)
Potassium: 3.3 mEq/L (ref 3.3–4.7)
SODIUM: 139 meq/L (ref 128–145)
TOTAL PROTEIN: 7.9 g/dL (ref 6.4–8.1)
Total Bilirubin: 0.8 mg/dl (ref 0.20–1.60)

## 2015-10-19 NOTE — Progress Notes (Signed)
Hematology and Oncology Follow Up Visit  Eileen Holland [id number] [birth year] 55 y.o. 10/19/2015   Principle Diagnosis:  DCIS of the right breast s/p lumpectomy (06/09/2015) and 16 fractions of radiation   - tumor ER +/PR +  Current Therapy:   Tamoxifen 20 mg PO daily    Interim History:  Eileen Holland is here today for a follow-up. She is doing quite well and has no complaints at this time. The radiation dermatitis to the breasts has healed she is no longer having any pain. The lumpectomy surgical site to the outer upper quadrant of the right breast has healed nicely. No mass, lesion or lymphadenopathy found on exam.  She is is doing well on Tamoxifen. She is still having hot flashes occasionally but they seem to have improved. We redrew her hormone levels today to check her menopausal status and the results are pending. No fever, chills, n/v, cough, rash, dizziness, SOB, chest pain, palpitations, abdominal pain or changes in bowel or bladder habits. No swelling, tenderness, numbness or tingling in her extremities.  She has a good appetite and is making sure to stay well hydrated. Her weight is stable.  She is still working full time and staying active.   Medications:    Medication List       This list is accurate as of: 10/19/15 12:14 PM.  Always use your most recent med list.               ACETAMINOPHEN-BUTALBITAL 50-325 MG Tabs  Take 1 tablet by mouth every 4 (four) hours as needed (headache.).     ALPRAZolam 0.5 MG tablet  Commonly known as:  XANAX  Take 0.5 mg by mouth 2 (two) times daily as needed for anxiety.     amLODipine 10 MG tablet  Commonly known as:  NORVASC  Take 10 mg by mouth every evening. @@7 :30 pm     B COMPLEX PO  Take 1 tablet by mouth daily at 12 noon. (B complex-C-E-zinc)     fluticasone 27.5 MCG/SPRAY nasal spray  Commonly known as:  VERAMYST  Place 2 sprays into the nose every morning.     hyaluronate sodium Gel  Apply 1 application topically 2  (two) times daily.     hydrochlorothiazide 25 MG tablet  Commonly known as:  HYDRODIURIL  Take 25 mg by mouth every morning.     ketoconazole 2 % cream  Commonly known as:  NIZORAL  Apply 1 application topically daily as needed for irritation.     levocetirizine 5 MG tablet  Commonly known as:  XYZAL  Take 5 mg by mouth daily at 12 noon. For allergies     losartan 100 MG tablet  Commonly known as:  COZAAR  Take 100 mg by mouth every morning.     metoprolol succinate 100 MG 24 hr tablet  Commonly known as:  TOPROL-XL  Take 100 mg by mouth every morning. Take with or immediately following a meal.     non-metallic deodorant Misc  Commonly known as:  ALRA  Apply 1 application topically daily as needed.     PARoxetine 20 MG tablet  Commonly known as:  PAXIL  Take 20 mg by mouth every morning.     PRENATAL PO  Take 1 tablet by mouth daily at 12 noon.     tamoxifen 20 MG tablet  Commonly known as:  NOLVADEX  Take 1 tablet (20 mg total) by mouth daily.        Allergies:  Allergies  Allergen Reactions  . Aspirin Hives  . Ibuprofen Hives    Past Medical History, Surgical history, Social history, and Family History were reviewed and updated.  Review of Systems: All other 10 point review of systems is negative.   Physical Exam:  height is 5\' 2"  (1.575 m) and weight is 155 lb (70.308 kg). Her oral temperature is 98.3 F (36.8 C). Her blood pressure is 142/81 and her pulse is 56. Her respiration is 16.   Wt Readings from Last 3 Encounters:  10/19/15 155 lb (70.308 kg)  09/07/15 154 lb (69.854 kg)  08/26/15 155 lb 11.2 oz (70.625 kg)    Ocular: Sclerae unicteric, pupils equal, round and reactive to light Ear-nose-throat: Oropharynx clear, dentition fair Lymphatic: No cervical supraclavicular or axillary adenopathy Lungs no rales or rhonchi, good excursion bilaterally Heart regular rate and rhythm, no murmur appreciated Abd soft, nontender, positive bowel sounds, no  liver or spleen tip palpated on exam  MSK no focal spinal tenderness, no joint edema Neuro: non-focal, well-oriented, appropriate affect Breasts: She has some hyperpigmentation of the right breast due to radiation that continues to fade, scar at the 10 o'clock position of the upper outer quadrant of the right breast has healed nicely. No changes in the left breast. No mass, lesion or rash found on exam.   Lab Results  Component Value Date   WBC 8.7 09/07/2015   HGB 13.3 09/07/2015   HCT 38.8 09/07/2015   MCV 104* 09/07/2015   PLT 216 09/07/2015   No results found for: FERRITIN, IRON, TIBC, UIBC, IRONPCTSAT Lab Results  Component Value Date   RBC 3.75 09/07/2015   No results found for: KPAFRELGTCHN, LAMBDASER, KAPLAMBRATIO No results found for: IGGSERUM, IGA, IGMSERUM No results found for: Odetta Pink, SPEI   Chemistry      Component Value Date/Time   NA 139 09/07/2015 1425   NA 137 07/18/2014 0422   K 3.7 09/07/2015 1425   K 4.1 07/18/2014 0422   CL 98 09/07/2015 1425   CL 101 07/18/2014 0422   CO2 29 09/07/2015 1425   CO2 24 07/18/2014 0422   BUN 7 09/07/2015 1425   BUN 7 07/18/2014 0422   CREATININE 1.0 09/07/2015 1425   CREATININE 0.87 07/18/2014 0422      Component Value Date/Time   CALCIUM 9.5 09/07/2015 1425   CALCIUM 8.8 07/18/2014 0422   ALKPHOS 64 09/07/2015 1425   ALKPHOS 61 03/24/2011 0924   AST 20 09/07/2015 1425   AST 15 03/24/2011 0924   ALT 13 09/07/2015 1425   ALT 8 03/24/2011 0924   BILITOT 0.50 09/07/2015 1425   BILITOT 0.4 03/24/2011 0924     Impression and Plan: Eileen Holland is a very pleasant 55 yo African American female with a ductal carcinoma in situ of the right breast. She underwent lumpectomy in October followed by radiation. She is now on Tamoxifen daily and has done well so far. She is asymptomatic at this time. Her breast exam was negative.  Her hormone levels are pending. We will see  if she is postmenopausal and ready to switch to an aromatase inhibitor. We will notify her of any changes to her treatment plan once these results are available.  We will go ahead and plan to see her back in 3 months for lab work and follow-up.  She will contact us with any questions or concerns. We can certainly see her sooner if need be.  Eliezer Bottom, NP 3/6/201712:14 PM

## 2015-10-20 LAB — FOLLICLE STIMULATING HORMONE: FSH: 96.6 m[IU]/mL

## 2015-10-20 LAB — LUTEINIZING HORMONE: LH: 45.2 m[IU]/mL

## 2015-10-25 LAB — ESTRADIOL, ULTRA SENS: Estradiol, Sensitive: <2.5 pg/mL

## 2015-10-30 ENCOUNTER — Other Ambulatory Visit: Payer: Self-pay | Admitting: Family

## 2015-10-30 DIAGNOSIS — C50411 Malignant neoplasm of upper-outer quadrant of right female breast: Secondary | ICD-10-CM

## 2015-10-30 MED ORDER — LETROZOLE 2.5 MG PO TABS: 2.5000 mg | ORAL_TABLET | Freq: Every day | ORAL | Status: DC

## 2015-11-02 ENCOUNTER — Other Ambulatory Visit: Payer: Self-pay | Admitting: Family

## 2015-11-02 DIAGNOSIS — C50411 Malignant neoplasm of upper-outer quadrant of right female breast: Secondary | ICD-10-CM

## 2015-11-03 ENCOUNTER — Other Ambulatory Visit: Payer: Self-pay | Admitting: Family

## 2015-11-24 ENCOUNTER — Telehealth: Payer: Self-pay | Admitting: Adult Health

## 2015-11-24 NOTE — Telephone Encounter (Signed)
I received a call from Ms. Eileen Holland regarding rescheduling her survivorship appt.  It appears that Ms. Eileen Holland was originally scheduled to be seen in 09/2015, but she called to cancel this appt.  She would now like to reschedule.  I gave her a brief overview of the survivorship program and our role in her care as a cancer survivor.  I let her know that purpose of this appt and that she would be meeting with Chestine Spore, NP-our breast survivorship NP.  I gave her instructions on where to check in for this appt.  I also gave her my direct office number to call me back if she has any other questions or concerns.  We look forward to participating in her care!  Her appt is scheduled for 12/01/15 at 2 pm.   I will route this message to Chestine Spore to make her aware.   Mike Craze, NP [place] [phone]

## 2015-12-01 ENCOUNTER — Ambulatory Visit (HOSPITAL_BASED_OUTPATIENT_CLINIC_OR_DEPARTMENT_OTHER): Payer: Managed Care, Other (non HMO) | Admitting: Nurse Practitioner

## 2015-12-01 ENCOUNTER — Encounter: Payer: Self-pay | Admitting: Nurse Practitioner

## 2015-12-01 VITALS — BP 146/87 | HR 83 | Temp 99.1°F | Resp 17 | Ht 62.0 in | Wt 156.4 lb

## 2015-12-01 DIAGNOSIS — Z170 Estrogen receptor positive status [ER+]: Secondary | ICD-10-CM | POA: Diagnosis not present

## 2015-12-01 DIAGNOSIS — D0511 Intraductal carcinoma in situ of right breast: Secondary | ICD-10-CM | POA: Diagnosis not present

## 2015-12-01 DIAGNOSIS — C50411 Malignant neoplasm of upper-outer quadrant of right female breast: Secondary | ICD-10-CM

## 2015-12-01 NOTE — Progress Notes (Signed)
CLINIC:  Cancer Survivorship   REASON FOR VISIT:  Routine follow-up post-treatment for a recent history of breast cancer.  BRIEF ONCOLOGIC HISTORY:    Primary cancer of upper outer quadrant of right female breast ([place])   04/13/2015 Mammogram In the right breast, calcifications warrant further evaluation with magnified views.    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%)   06/09/2015 Pathologic Stage Stage 0: Tis Nx   06/30/2015 Procedure Breast/Ovarian panel reveals no clinically signficant variant at ATM, BARD1, BRCA1, BRCA2, BRIP1, CDH1, CHEK2, FANCC, MLH1, MSH2, MSH6, NBN, PALB2, PMS2, PTEN, RAD51C, RAD51D, TP53, and XRCC2   07/13/2015 - 08/03/2015 Radiation Therapy Adjuvant RT: Right breast 42.56 Gy in 16 sessions, no boost   09/07/2015 -  Anti-estrogen oral therapy Tamoxifen 20 mg began; changed to letrozole 2.5 mg daily 10/2015    INTERVAL HISTORY:  Eileen Holland presents to the [facility] today for our initial meeting to review her survivorship care plan detailing her treatment course for breast cancer, as well as monitoring long-term side effects of that treatment, education regarding health maintenance, screening, and overall wellness and health promotion.     Overall, Eileen Holland reports feeling quite well since completing her radiation therapy approximately four months ago.  She remains fatigued but states that the skin changes overlying her breast have resolved. She transitioned from tamoxifen, which she begun in January 2017 to letrozole in March 2017. Thus far, she has had some hot flashes, but states they are bearable.  She does report some groin pain which began about a week ago.  It does not occur constantly and seems to be positional.  She is able to walk and put pressure on her  left leg without pain.  She has had intermittent breast pain since surgery, and describes it as a shooting / burning sensation.  She has some mild headaches and states that her blood pressure has been elevated lately due to stresses at work. She denies any accompanying visual disturbances or chest pain. She denies any cough, shortness of breath or bone pain.  She has a good appetite and denies any weight loss.    REVIEW OF SYSTEMS:  General: Fatigue and hot flashes as above. Occasional headaches. Denies fever, chills, or unintentional weight loss. HEENT: Denies visual changes, hearing loss, mouth sores or difficulty swallowing. Cardiac: Denies palpitations, chest pain, and lower extremity edema.  Respiratory: Denies wheeze or dyspnea on exertion.  Breast: As above GI: Denies abdominal pain, constipation, diarrhea, nausea, or vomiting.  GU: Denies dysuria, hematuria, vaginal bleeding, vaginal discharge, or vaginal dryness.  Musculoskeletal: As above Neuro: Denies recent fall or numbness / tingling in her extremities. Skin: Denies rash, pruritis, or open wounds.  Psych: Denies depression, anxiety, insomnia, or memory loss.   A 14-point review of systems was completed and was negative, except as noted above.   ONCOLOGY TREATMENT TEAM:  1. Surgeon:  Dr. Brantley Stage at [facility]  2. Medical Oncologist: Dr. Marin Olp 3. Radiation Oncologist: Dr. Valere Dross    PAST MEDICAL/SURGICAL HISTORY:  Past Medical History  Diagnosis Date  . Hypertension   . Depression   . Allergy   . Headache     hx of per PCP history  . Anxiety     panic attacks per PCP history  . Headache, migraine 07/04/2014    Last Assessment &  Plan:  Refilled butalbital for rare use, once monthly as cannot take NSAIDs.  Advised if needs it more regularly, to follow-up and discuss management furtehr   . Breast calcification, right   . Radiation 07/13/15-08/03/15    right breast 4256 cGy   Past Surgical History    Procedure Laterality Date  . Knee arthroscopy Right 2000  . Vaginal hysterectomy  2012  . Colonscopy     . Breast lumpectomy with radioactive seed localization Right 06/09/2015    Procedure: RIGHT BREAST LUMPECTOMY WITH RADIOACTIVE SEED LOCALIZATION;  Surgeon: Erroll Luna, MD;  Location: [facility];  Service: General;  Laterality: Right;     ALLERGIES:  Allergies  Allergen Reactions  . Aspirin Hives  . Ibuprofen Hives     CURRENT MEDICATIONS:  Current Outpatient Prescriptions on File Prior to Visit  Medication Sig Dispense Refill  . ACETAMINOPHEN-BUTALBITAL 50-325 MG TABS Take 1 tablet by mouth every 4 (four) hours as needed (headache.).    Marland Kitchen ALPRAZolam (XANAX) 0.5 MG tablet Take 0.5 mg by mouth 2 (two) times daily as needed for anxiety.    Marland Kitchen amLODipine (NORVASC) 10 MG tablet Take 10 mg by mouth every evening. @'@7' :30 pm    . B Complex Vitamins (B COMPLEX PO) Take 1 tablet by mouth daily at 12 noon. (B complex-C-E-zinc)    . fluticasone (VERAMYST) 27.5 MCG/SPRAY nasal spray Place 2 sprays into the nose every morning.     . hydrochlorothiazide 25 MG tablet Take 25 mg by mouth every morning.     Marland Kitchen ketoconazole (NIZORAL) 2 % cream Apply 1 application topically daily as needed for irritation.    Marland Kitchen letrozole (FEMARA) 2.5 MG tablet Take 1 tablet (2.5 mg total) by mouth daily. 30 tablet 3  . levocetirizine (XYZAL) 5 MG tablet Take 5 mg by mouth daily at 12 noon. For allergies     . losartan (COZAAR) 100 MG tablet Take 100 mg by mouth every morning.    . metoprolol succinate (TOPROL-XL) 100 MG 24 hr tablet Take 100 mg by mouth every morning. Take with or immediately following a meal.    . PARoxetine (PAXIL) 20 MG tablet Take 20 mg by mouth every morning.      . Prenatal Vit-Fe Fumarate-FA (PRENATAL PO) Take 1 tablet by mouth daily at 12 noon.    . non-metallic deodorant Jethro Poling) MISC Apply 1 application topically daily as needed. Reported on 12/01/2015     No current  facility-administered medications on file prior to visit.     ONCOLOGIC FAMILY HISTORY:  Family History  Problem Relation Age of Onset  . Colon cancer Neg Hx   . Lung cancer Father 14    former smoker  . Colon polyps Father     unspecified number  . Breast cancer Sister 50    negative BRCA testing in 2007  . Stomach cancer Maternal Uncle     dx. 50s-early 1s  . Heart attack Paternal Uncle      GENETIC COUNSELING/TESTING: Yes, performed 06/2015: Breast/Ovarian panel reveals no clinically signficant variant at ATM, BARD1, BRCA1, BRCA2, BRIP1, CDH1, CHEK2, FANCC, MLH1, MSH2, MSH6, NBN, PALB2, PMS2, PTEN, RAD51C, RAD51D, TP53, and XRCC2   SOCIAL HISTORY:  Eileen Holland is single and lives alone in [place], [place].  She has 1 daughter. 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 She currently smokes half a pack a day and drinks alcohol approximately once every 3 months.  She denies any current or history  of illicit drug use.     PHYSICAL EXAMINATION:  Vital Signs: Filed Vitals:   12/01/15 1359  BP: 146/87  Pulse: 83  Temp: 99.1 F (37.3 C)  Resp: 17   ECOG Performance Status: 0  General: Well-nourished, well-appearing female in no acute distress.  She is unaccompanied in clinic today.   HEENT: Head is atraumatic and normocephalic.  Pupils equal and reactive to light and accomodation. Conjunctivae clear without exudate.  Sclerae anicteric. Oral mucosa is pink, moist, and intact without lesions.  Oropharynx is pink without lesions or erythema.  Lymph: No cervical, supraclavicular, infraclavicular, or axillary lymphadenopathy noted on palpation.  Cardiovascular: Regular rate and rhythm without murmurs, rubs, or gallops. Respiratory: Clear to auscultation bilaterally. Chest expansion symmetric without accessory muscle use on inspiration or expiration.  GI: Abdomen soft and round. No tenderness to palpation. Bowel sounds normoactive in 4 quadrants.  GU:  Deferred.  Neuro: No focal deficits. Steady gait.  Psych: Mood and affect normal and appropriate for situation.  Extremities: No edema, cyanosis, or clubbing.  Skin: Warm and dry. No open lesions noted.   LABORATORY DATA:  None for this visit.  DIAGNOSTIC IMAGING:  None for this visit.     ASSESSMENT AND PLAN:   1. 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  Ms. Branaman is doing well without clinical symptoms worrisome for disease recurrence. She will follow-up with her medical oncologist,  Dr. Marin Olp, in June 2017 with history and physical examination per surveillance protocol.  She will continue her anti-estrogen therapy with letrozole at this time and was instructed to make [place] aware if she begins to experience any new or increased side effects of the medication.  A comprehensive survivorship care plan and treatment summary was reviewed with the patient today detailing her breast cancer diagnosis, treatment course, potential late/long-term effects of treatment, appropriate follow-up care with recommendations for the future, and patient education resources.  A copy of this summary, along with a letter will be sent to the patient's primary care provider via in basket message after today's visit.  Ms. Kontos is welcome to return to the Survivorship Clinic in the future, as needed; no follow-up will be scheduled at this time.   2. Groin pain: Ms. Railey' symptoms sound musculoskeletal in nature.  It is possible that it is being caused by the letrozole, however, I would expect it to be ongoing rather than intermittient, as she describes.  She will treat this symptomatically and report to Dr. Marin Olp if it does not improve in the next  few days.  3. Headaches: I have asked Ms. Kelly to monitor her blood pressure at work and report to Dr. Doreene Nest if her blood pressure remains elevated.  It is minimally elevated today.  I do not believe these are related to her cancer, however, could be aggravated by her endocrine therapy and certainly the stressors at work.  4. Bone health:  Given Ms. Allender's age/history of breast cancer and her current treatment regimen including endocrine therapy with letrozole, she is at risk for bone demineralization.  Per our records, I do not see that she has undergone bone density testing.  She will discuss this further with Dr. Marin Olp at her upcoming appointment.  We will continue to monitor this closely while  she is on endocrine therapy.  In the meantime, she was encouraged to increase her consumption of foods rich in calcium and vitamin D as well as to increase her weight-bearing activities.  She was given education on specific activities to promote bone health.  5. Cancer screening:  Due to Ms. Doom's history and her age, she should receive screening for skin cancers and colon cancer. She is S/P hysterectomy. The information and recommendations are listed on the patient's comprehensive care plan/treatment summary and were reviewed in detail with the patient.    6. Health maintenance and wellness promotion: Ms. Coble was encouraged to consume 5-7 servings of fruits and vegetables per day. We reviewed the "Nutrition Rainbow" handout, as well as discussed recommendations to maximize nutrition and minimize recurrence, such as increased intake of fruits, vegetables, lean proteins, and minimizing the intake of red meats and processed foods.  She was also encouraged to engage in moderate to vigorous exercise for 30 minutes per day most days of the week. We discussed the [employer] fitness program, which is designed for cancer survivors to help them become more physically fit after cancer treatments.  She was  instructed to limit her alcohol consumption and strongly encouraged to stop smoking. She is working to decrease her cigarette use daily.  A copy of the "Take Control of Your Health" brochure was given to her reinforcing these recommendations.   7. Support services/counseling: It is not uncommon for this period of the patient's cancer care trajectory to be one of many emotions and stressors.  We discussed an opportunity for her to participate in the next session of [facility] ("Finding Your New Normal") support group series designed for patients after they have completed treatment.  Ms. Bhagat was encouraged to take advantage of our many other support services programs, support groups, and/or counseling in coping with her new life as a cancer survivor after completing anti-cancer treatment.  She was offered support today through active listening and expressive supportive counseling.  She was given information regarding our available services and encouraged to contact me with any questions or for help enrolling in any of our support group/programs.    A total of 60 minutes of face-to-face time was spent with this patient with greater than 50% of that time in counseling and care-coordination.   Sylvan Cheese, NP  Survivorship Program [facility] [phone]   Note: PRIMARY CARE PROVIDER Suzanna Obey, [place] [phone]

## 2016-01-19 ENCOUNTER — Ambulatory Visit (HOSPITAL_BASED_OUTPATIENT_CLINIC_OR_DEPARTMENT_OTHER): Payer: Managed Care, Other (non HMO) | Admitting: Family

## 2016-01-19 ENCOUNTER — Other Ambulatory Visit (HOSPITAL_BASED_OUTPATIENT_CLINIC_OR_DEPARTMENT_OTHER): Payer: Managed Care, Other (non HMO)

## 2016-01-19 ENCOUNTER — Encounter: Payer: Self-pay | Admitting: Family

## 2016-01-19 VITALS — BP 141/91 | HR 69 | Temp 99.0°F | Resp 18 | Ht 62.0 in | Wt 151.0 lb

## 2016-01-19 DIAGNOSIS — T451X5A Adverse effect of antineoplastic and immunosuppressive drugs, initial encounter: Secondary | ICD-10-CM

## 2016-01-19 DIAGNOSIS — D0511 Intraductal carcinoma in situ of right breast: Secondary | ICD-10-CM

## 2016-01-19 DIAGNOSIS — C50411 Malignant neoplasm of upper-outer quadrant of right female breast: Secondary | ICD-10-CM

## 2016-01-19 DIAGNOSIS — R232 Flushing: Secondary | ICD-10-CM

## 2016-01-19 DIAGNOSIS — Z79811 Long term (current) use of aromatase inhibitors: Secondary | ICD-10-CM

## 2016-01-19 DIAGNOSIS — Z170 Estrogen receptor positive status [ER+]: Secondary | ICD-10-CM

## 2016-01-19 LAB — CBC WITH DIFFERENTIAL (CANCER CENTER ONLY)
BASO#: 0 10*3/uL (ref 0.0–0.2)
BASO%: 0.5 % (ref 0.0–2.0)
EOS ABS: 0.1 10*3/uL (ref 0.0–0.5)
EOS%: 1.7 % (ref 0.0–7.0)
HCT: 37.8 % (ref 34.8–46.6)
HGB: 13 g/dL (ref 11.6–15.9)
LYMPH#: 1.6 10*3/uL (ref 0.9–3.3)
LYMPH%: 20 % (ref 14.0–48.0)
MCH: 35.6 pg — AB (ref 26.0–34.0)
MCHC: 34.4 g/dL (ref 32.0–36.0)
MCV: 104 fL — AB (ref 81–101)
MONO#: 0.6 10*3/uL (ref 0.1–0.9)
MONO%: 6.9 % (ref 0.0–13.0)
NEUT#: 5.8 10*3/uL (ref 1.5–6.5)
NEUT%: 70.9 % (ref 39.6–80.0)
PLATELETS: 288 10*3/uL (ref 145–400)
RBC: 3.65 10*6/uL — AB (ref 3.70–5.32)
RDW: 11.3 % (ref 11.1–15.7)
WBC: 8.1 10*3/uL (ref 3.9–10.0)

## 2016-01-19 LAB — COMPREHENSIVE METABOLIC PANEL
ALBUMIN: 3.6 g/dL (ref 3.5–5.0)
ALK PHOS: 73 U/L (ref 40–150)
ALT: 13 U/L (ref 0–55)
ANION GAP: 9 meq/L (ref 3–11)
AST: 20 U/L (ref 5–34)
BILIRUBIN TOTAL: 0.39 mg/dL (ref 0.20–1.20)
BUN: 9.1 mg/dL (ref 7.0–26.0)
CO2: 28 mEq/L (ref 22–29)
Calcium: 9.9 mg/dL (ref 8.4–10.4)
Chloride: 104 mEq/L (ref 98–109)
Creatinine: 0.9 mg/dL (ref 0.6–1.1)
EGFR: 85 mL/min/{1.73_m2} — AB (ref >90)
Glucose: 100 mg/dl (ref 70–140)
Potassium: 3.8 mEq/L (ref 3.5–5.1)
Sodium: 140 mEq/L (ref 136–145)
TOTAL PROTEIN: 7.8 g/dL (ref 6.4–8.3)

## 2016-01-19 MED ORDER — MEGESTROL ACETATE 20 MG PO TABS: 20.0000 mg | ORAL_TABLET | Freq: Every day | ORAL | Status: DC

## 2016-01-19 NOTE — Progress Notes (Signed)
Hematology and Oncology Follow Up Visit  Jacueline Mucker OC:[id number] 10-Oct-1960 55 y.o. 01/19/2016   Principle Diagnosis:  DCIS of the right breast s/p lumpectomy (06/09/2015) and 16 fractions of radiation   - tumor ER +/PR +  Current Therapy:   Femara 2.5 mg PO daily    Interim History:  Ms. Goel is here today for a follow-up. She is getting over a cold at this time and is feeling fatigued. She is also having hot flashes at night. She is not on Megace at this time.  She has some tenderness in her right breast at the 10 o'clock position in the area where she had her lumpectomy and received radiation. Her breast exam today was negative. No mass or abnormality was found. She will let our office and surgery Dr. Josetta Huddle office know if the pain worsens or if she finds any abnormality on self breast examination.  No fever, chills, n/v, cough, rash, dizziness, SOB, chest pain, palpitations, abdominal pain or changes in bowel or bladder habits. No swelling, tenderness, numbness or tingling in her extremities.  She has a good appetite and is making sure to stay well hydrated. Her weight is stable.  She is still working full time and staying active.   Medications:    Medication List       This list is accurate as of: 01/19/16 11:30 AM.  Always use your most recent med list.               ACETAMINOPHEN-BUTALBITAL 50-325 MG Tabs  Take 1 tablet by mouth every 4 (four) hours as needed (headache.).     ALPRAZolam 0.5 MG tablet  Commonly known as:  XANAX  Take 0.5 mg by mouth 2 (two) times daily as needed for anxiety.     amLODipine 10 MG tablet  Commonly known as:  NORVASC  Take 10 mg by mouth every evening. @@7 :30 pm     B COMPLEX PO  Take 1 tablet by mouth daily at 12 noon. (B complex-C-E-zinc)     docusate sodium 100 MG capsule  Commonly known as:  COLACE  Take 100 mg by mouth 2 (two) times daily.     fluticasone 27.5 MCG/SPRAY nasal spray  Commonly known as:  VERAMYST  Place 2  sprays into the nose every morning.     hydrochlorothiazide 25 MG tablet  Commonly known as:  HYDRODIURIL  Take 25 mg by mouth every morning.     ketoconazole 2 % cream  Commonly known as:  NIZORAL  Apply 1 application topically daily as needed for irritation.     letrozole 2.5 MG tablet  Commonly known as:  FEMARA  Take 1 tablet (2.5 mg total) by mouth daily.     levocetirizine 5 MG tablet  Commonly known as:  XYZAL  Take 5 mg by mouth daily at 12 noon. For allergies     losartan 100 MG tablet  Commonly known as:  COZAAR  Take 100 mg by mouth every morning.     metoprolol succinate 100 MG 24 hr tablet  Commonly known as:  TOPROL-XL  Take 100 mg by mouth every morning. Take with or immediately following a meal.     non-metallic deodorant Misc  Commonly known as:  ALRA  Apply 1 application topically daily as needed. Reported on 12/01/2015     PARoxetine 20 MG tablet  Commonly known as:  PAXIL  Take 20 mg by mouth every morning.     polycarbophil 625 MG tablet  Commonly known as:  FIBERCON  Take 625 mg by mouth daily.     PRENATAL PO  Take 1 tablet by mouth daily at 12 noon.     VITAMIN D HIGH POTENCY 1000 units capsule  Generic drug:  Cholecalciferol  Take 2,000 Units by mouth daily.        Allergies:  Allergies  Allergen Reactions  . Aspirin Hives  . Ibuprofen Hives    Past Medical History, Surgical history, Social history, and Family History were reviewed and updated.  Review of Systems: All other 10 point review of systems is negative.   Physical Exam:  vitals were not taken for this visit.  Wt Readings from Last 3 Encounters:  12/01/15 156 lb 6.4 oz (70.943 kg)  10/19/15 155 lb (70.308 kg)  09/07/15 154 lb (69.854 kg)    Ocular: Sclerae unicteric, pupils equal, round and reactive to light Ear-nose-throat: Oropharynx clear, dentition fair Lymphatic: No cervical supraclavicular or axillary adenopathy Lungs no rales or rhonchi, good excursion  bilaterally Heart regular rate and rhythm, no murmur appreciated Abd soft, nontender, positive bowel sounds, no liver or spleen tip palpated on exam  MSK no focal spinal tenderness, no joint edema Neuro: non-focal, well-oriented, appropriate affect Breasts: She has fading hyperpigmentation of the right breast due to radiation that continues to fade, scar at the 10 o'clock position of the upper outer quadrant of the right breast has healed nicely. She has some tenderness in the area where she had radiation.No mass, lesion or rash found on exam. No changes in the left breast.   Lab Results  Component Value Date   WBC 8.9 10/19/2015   HGB 12.0 10/19/2015   HCT 35.4 10/19/2015   MCV 104* 10/19/2015   PLT 235 10/19/2015   No results found for: FERRITIN, IRON, TIBC, UIBC, IRONPCTSAT Lab Results  Component Value Date   RBC 3.40* 10/19/2015   No results found for: KPAFRELGTCHN, LAMBDASER, KAPLAMBRATIO No results found for: Kandis Cocking, IGMSERUM No results found for: Odetta Pink, SPEI   Chemistry      Component Value Date/Time   NA 139 10/19/2015 1158   NA 137 07/18/2014 0422   K 3.3 10/19/2015 1158   K 4.1 07/18/2014 0422   CL 103 10/19/2015 1158   CL 101 07/18/2014 0422   CO2 29 10/19/2015 1158   CO2 24 07/18/2014 0422   BUN 9 10/19/2015 1158   BUN 7 07/18/2014 0422   CREATININE 1.1 10/19/2015 1158   CREATININE 0.87 07/18/2014 0422      Component Value Date/Time   CALCIUM 9.4 10/19/2015 1158   CALCIUM 8.8 07/18/2014 0422   ALKPHOS 65 10/19/2015 1158   ALKPHOS 61 03/24/2011 0924   AST 27 10/19/2015 1158   AST 15 03/24/2011 0924   ALT 20 10/19/2015 1158   ALT 8 03/24/2011 0924   BILITOT 0.80 10/19/2015 1158   BILITOT 0.4 03/24/2011 0924     Impression and Plan: Ms. Levert is a very pleasant 55 yo African American female with a ductal carcinoma in situ of the right breast. She underwent a lumpectomy in October followed  by radiation. She is now postmenopausal and doing well on Femara.  She is having some tenderness at the 10 o'clock position of the right breast where she had her lumpectomy and received radiation. She will continue to monitor this and notify us and also surgery if the pain worsens. No mass or abnormality identified on exam.  She will  have her scheduled 3D mammogram in August as planned.  Prescription for Megace sent to her pharmacy. We will see if this gives her some relief from hot flashes.  We will go ahead and plan to see her back in 3 months for lab work and follow-up.  She will contact our office with any questions or concerns. We can certainly see her sooner if need be.   Eliezer Bottom, NP 6/6/201711:30 AM

## 2016-02-25 ENCOUNTER — Other Ambulatory Visit: Payer: Self-pay | Admitting: Family

## 2016-03-26 ENCOUNTER — Other Ambulatory Visit: Payer: Self-pay | Admitting: Family

## 2016-04-13 ENCOUNTER — Ambulatory Visit
Admission: RE | Admit: 2016-04-13 | Discharge: 2016-04-13 | Disposition: A | Discharge status: Home / Self Care | Payer: Managed Care, Other (non HMO) | Source: Ambulatory Visit | Attending: Family | Admitting: Family

## 2016-04-13 DIAGNOSIS — C50411 Malignant neoplasm of upper-outer quadrant of right female breast: Secondary | ICD-10-CM

## 2016-04-15 ENCOUNTER — Telehealth: Payer: Self-pay

## 2016-04-15 NOTE — Telephone Encounter (Addendum)
-----   Message from Eliezer Bottom, NP sent at 04/15/2016  9:35 AM EDT ----- Regarding: Mammogram Looked good! Will repeat in 1 year :)  Judson Roch  Above message left on personalized VM with instructions to call the office for questions/concerns. dph

## 2016-04-19 ENCOUNTER — Other Ambulatory Visit: Payer: Managed Care, Other (non HMO)

## 2016-04-19 ENCOUNTER — Ambulatory Visit: Payer: Managed Care, Other (non HMO) | Admitting: Hematology & Oncology

## 2016-04-22 ENCOUNTER — Encounter: Payer: Self-pay | Admitting: Hematology & Oncology

## 2016-04-22 ENCOUNTER — Ambulatory Visit (HOSPITAL_BASED_OUTPATIENT_CLINIC_OR_DEPARTMENT_OTHER): Payer: Managed Care, Other (non HMO) | Admitting: Hematology & Oncology

## 2016-04-22 ENCOUNTER — Other Ambulatory Visit (HOSPITAL_BASED_OUTPATIENT_CLINIC_OR_DEPARTMENT_OTHER): Payer: Managed Care, Other (non HMO)

## 2016-04-22 VITALS — BP 140/93 | HR 59 | Temp 98.3°F | Resp 18 | Ht 62.0 in | Wt 151.0 lb

## 2016-04-22 DIAGNOSIS — D0511 Intraductal carcinoma in situ of right breast: Secondary | ICD-10-CM

## 2016-04-22 DIAGNOSIS — C50411 Malignant neoplasm of upper-outer quadrant of right female breast: Secondary | ICD-10-CM

## 2016-04-22 DIAGNOSIS — Z170 Estrogen receptor positive status [ER+]: Secondary | ICD-10-CM

## 2016-04-22 DIAGNOSIS — T451X5A Adverse effect of antineoplastic and immunosuppressive drugs, initial encounter: Secondary | ICD-10-CM

## 2016-04-22 DIAGNOSIS — R232 Flushing: Secondary | ICD-10-CM

## 2016-04-22 DIAGNOSIS — Z79811 Long term (current) use of aromatase inhibitors: Secondary | ICD-10-CM | POA: Diagnosis not present

## 2016-04-22 LAB — CBC WITH DIFFERENTIAL (CANCER CENTER ONLY)
BASO#: 0 10*3/uL (ref 0.0–0.2)
BASO%: 0.3 % (ref 0.0–2.0)
EOS ABS: 0.1 10*3/uL (ref 0.0–0.5)
EOS%: 1.6 % (ref 0.0–7.0)
HCT: 38.2 % (ref 34.8–46.6)
HEMOGLOBIN: 13.2 g/dL (ref 11.6–15.9)
LYMPH#: 1.3 10*3/uL (ref 0.9–3.3)
LYMPH%: 14.8 % (ref 14.0–48.0)
MCH: 35.5 pg — ABNORMAL HIGH (ref 26.0–34.0)
MCHC: 34.6 g/dL (ref 32.0–36.0)
MCV: 103 fL — ABNORMAL HIGH (ref 81–101)
MONO#: 0.5 10*3/uL (ref 0.1–0.9)
MONO%: 5.1 % (ref 0.0–13.0)
NEUT%: 78.2 % (ref 39.6–80.0)
NEUTROS ABS: 7 10*3/uL — AB (ref 1.5–6.5)
Platelets: 214 10*3/uL (ref 145–400)
RBC: 3.72 10*6/uL (ref 3.70–5.32)
RDW: 11.4 % (ref 11.1–15.7)
WBC: 8.9 10*3/uL (ref 3.9–10.0)

## 2016-04-22 LAB — COMPREHENSIVE METABOLIC PANEL
ALBUMIN: 3.8 g/dL (ref 3.5–5.0)
ALK PHOS: 70 U/L (ref 40–150)
ALT: 16 U/L (ref 0–55)
AST: 21 U/L (ref 5–34)
Anion Gap: 10 mEq/L (ref 3–11)
BILIRUBIN TOTAL: 0.44 mg/dL (ref 0.20–1.20)
BUN: 13.3 mg/dL (ref 7.0–26.0)
CO2: 28 mEq/L (ref 22–29)
Calcium: 9.7 mg/dL (ref 8.4–10.4)
Chloride: 102 mEq/L (ref 98–109)
Creatinine: 0.9 mg/dL (ref 0.6–1.1)
EGFR: 89 mL/min/{1.73_m2} — ABNORMAL LOW (ref >90)
GLUCOSE: 106 mg/dL (ref 70–140)
Potassium: 3.8 mEq/L (ref 3.5–5.1)
SODIUM: 140 meq/L (ref 136–145)
TOTAL PROTEIN: 7.8 g/dL (ref 6.4–8.3)

## 2016-04-22 NOTE — Progress Notes (Signed)
This  Hematology and Oncology Follow Up Visit  Eileen Holland [id number] [birth year] 55 y.o. 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 She feels a little better. We did have her on some Megace for hot flashes. She never took this because she is worried about side effects.   She had a mammogram on August 30. Everything looked good on the mammogram with no evidence of residual or recurrent disease.   She is still working. She is doing well at work.   She has had no cough or shortness of breath. There's been no change in bowel or bladder habits. She's had no leg swelling. She's had no rashes.   Overall, her performance status is ECOG 0.   Medications:    Medication List       Accurate as of 04/22/16 11:13 AM. Always use your most recent med list.          ACETAMINOPHEN-BUTALBITAL 50-325 MG Tabs Take 1 tablet by mouth every 4 (four) hours as needed (headache.).   ALPRAZolam 0.5 MG tablet Commonly known as:  XANAX Take 0.5 mg by mouth 2 (two) times daily as needed for anxiety.   amLODipine 10 MG tablet Commonly known as:  NORVASC Take 10 mg by mouth every evening. @@7 :30 pm   B COMPLEX PO Take 1 tablet by mouth daily at 12 noon. (B complex-C-E-zinc)   docusate sodium 100 MG capsule Commonly known as:  COLACE Take 100 mg by mouth 2 (two) times daily.   fluticasone 27.5 MCG/SPRAY nasal spray Commonly known as:  VERAMYST Place 2 sprays into the nose every morning.   hydrochlorothiazide 25 MG tablet Commonly known as:  HYDRODIURIL Take 25 mg by mouth every morning.   ketoconazole 2 % cream Commonly known as:  NIZORAL Apply 1 application topically daily as needed for irritation.   letrozole 2.5 MG tablet Commonly known as:  FEMARA TAKE 1 TABLET(2.5 MG) BY MOUTH DAILY     levocetirizine 5 MG tablet Commonly known as:  XYZAL Take 5 mg by mouth daily at 12 noon. For allergies   losartan 100 MG tablet Commonly known as:  COZAAR Take 100 mg by mouth every morning.   megestrol 20 MG tablet Commonly known as:  MEGACE Take 1 tablet (20 mg total) by mouth daily.   metoprolol succinate 100 MG 24 hr tablet Commonly known as:  TOPROL-XL Take 100 mg by mouth every morning. Take with or immediately following a meal.   PARoxetine 20 MG tablet Commonly known as:  PAXIL Take 20 mg by mouth every morning.   polycarbophil 625 MG tablet Commonly known as:  FIBERCON Take 625 mg by mouth daily.   PRENATAL PO Take 1 tablet by mouth daily at 12 noon.   VITAMIN D HIGH POTENCY 1000 units capsule Generic drug:  Cholecalciferol Take 2,000 Units by mouth daily.       Allergies:  Allergies  Allergen Reactions  . Aspirin Hives  . Ibuprofen Hives    Past Medical History, Surgical history, Social history, and Family History were reviewed and updated.  Review of Systems: All other 10 point review of systems is negative.   Physical Exam:  height is 5\' 2"  (1.575 m) and weight is 151 lb (68.5 kg). Her oral  temperature is 98.3 F (36.8 C). Her blood pressure is 140/93 (abnormal) and her pulse is 59 (abnormal). Her respiration is 18.   Wt Readings from Last 3 Encounters:  04/22/16 151 lb (68.5 kg)  01/19/16 151 lb (68.5 kg)  12/01/15 156 lb 6.4 oz (70.9 kg)    Ocular: Sclerae unicteric, pupils equal, round and reactive to light Ear-nose-throat: Oropharynx clear, dentition fair Lymphatic: No cervical supraclavicular or axillary adenopathy Lungs no rales or rhonchi, good excursion bilaterally Heart regular rate and rhythm, no murmur appreciated Abd soft, nontender, positive bowel sounds, no liver or spleen tip palpated on exam  MSK no focal spinal tenderness, no joint edema Neuro: non-focal, well-oriented, appropriate affect Breasts: She has fading  hyperpigmentation of the right breast due to radiation that continues to fade, scar at the 10 o'clock position of the upper outer quadrant of the right breast has healed nicely. She has some tenderness in the area where she had radiation.No mass, lesion or rash found on exam. No changes in the left breast.   Lab Results  Component Value Date   WBC 8.9 04/22/2016   HGB 13.2 04/22/2016   HCT 38.2 04/22/2016   MCV 103 (H) 04/22/2016   PLT 214 04/22/2016   No results found for: FERRITIN, IRON, TIBC, UIBC, IRONPCTSAT Lab Results  Component Value Date   RBC 3.72 04/22/2016   No results found for: KPAFRELGTCHN, LAMBDASER, KAPLAMBRATIO No results found for: IGGSERUM, IGA, IGMSERUM No results found for: Odetta Pink, SPEI   Chemistry      Component Value Date/Time   NA 140 01/19/2016 1111   K 3.8 01/19/2016 1111   CL 103 10/19/2015 1158   CO2 28 01/19/2016 1111   BUN 9.1 01/19/2016 1111   CREATININE 0.9 01/19/2016 1111      Component Value Date/Time   CALCIUM 9.9 01/19/2016 1111   ALKPHOS 73 01/19/2016 1111   AST 20 01/19/2016 1111   ALT 13 01/19/2016 1111   BILITOT 0.39 01/19/2016 1111     Impression and Plan: Eileen Holland is a very pleasant 55 yo African American female with a ductal carcinoma in situ of the right breast. She underwent a lumpectomy in October followed by radiation. She is  postmenopausal and doing well on Femara.   Her mammogram looked fantastic. She looks good.  I think we probably get her back now in 4 months. She had her surgery back in October 2016.   Volanda Napoleon, MD 9/8/201711:13 AM

## 2016-04-25 ENCOUNTER — Encounter: Payer: Self-pay | Admitting: Gastroenterology

## 2016-04-26 ENCOUNTER — Other Ambulatory Visit: Payer: Self-pay | Admitting: Family

## 2016-05-29 ENCOUNTER — Other Ambulatory Visit: Payer: Self-pay | Admitting: Family

## 2016-06-28 ENCOUNTER — Other Ambulatory Visit: Payer: Self-pay | Admitting: Family

## 2016-07-29 ENCOUNTER — Other Ambulatory Visit: Payer: Self-pay | Admitting: Family

## 2016-08-22 ENCOUNTER — Ambulatory Visit: Payer: Managed Care, Other (non HMO) | Admitting: Hematology & Oncology

## 2016-08-22 ENCOUNTER — Other Ambulatory Visit: Payer: Managed Care, Other (non HMO)

## 2016-08-28 ENCOUNTER — Other Ambulatory Visit: Payer: Self-pay | Admitting: Family

## 2016-09-27 ENCOUNTER — Ambulatory Visit (HOSPITAL_BASED_OUTPATIENT_CLINIC_OR_DEPARTMENT_OTHER): Payer: Managed Care, Other (non HMO) | Admitting: Hematology & Oncology

## 2016-09-27 ENCOUNTER — Other Ambulatory Visit (HOSPITAL_BASED_OUTPATIENT_CLINIC_OR_DEPARTMENT_OTHER): Payer: Managed Care, Other (non HMO)

## 2016-09-27 VITALS — BP 162/98 | HR 59 | Temp 98.3°F | Wt 148.0 lb

## 2016-09-27 DIAGNOSIS — Z79811 Long term (current) use of aromatase inhibitors: Secondary | ICD-10-CM

## 2016-09-27 DIAGNOSIS — D0511 Intraductal carcinoma in situ of right breast: Secondary | ICD-10-CM

## 2016-09-27 DIAGNOSIS — E559 Vitamin D deficiency, unspecified: Secondary | ICD-10-CM

## 2016-09-27 DIAGNOSIS — Z170 Estrogen receptor positive status [ER+]: Secondary | ICD-10-CM

## 2016-09-27 DIAGNOSIS — C50411 Malignant neoplasm of upper-outer quadrant of right female breast: Secondary | ICD-10-CM

## 2016-09-27 LAB — CBC WITH DIFFERENTIAL (CANCER CENTER ONLY)
BASO#: 0 10*3/uL (ref 0.0–0.2)
BASO%: 0.4 % (ref 0.0–2.0)
EOS%: 2 % (ref 0.0–7.0)
Eosinophils Absolute: 0.2 10*3/uL (ref 0.0–0.5)
HCT: 38.6 % (ref 34.8–46.6)
HGB: 13.1 g/dL (ref 11.6–15.9)
LYMPH#: 1.4 10*3/uL (ref 0.9–3.3)
LYMPH%: 15.3 % (ref 14.0–48.0)
MCH: 35.5 pg — ABNORMAL HIGH (ref 26.0–34.0)
MCHC: 33.9 g/dL (ref 32.0–36.0)
MCV: 105 fL — AB (ref 81–101)
MONO#: 0.6 10*3/uL (ref 0.1–0.9)
MONO%: 6.6 % (ref 0.0–13.0)
NEUT#: 6.7 10*3/uL — ABNORMAL HIGH (ref 1.5–6.5)
NEUT%: 75.7 % (ref 39.6–80.0)
PLATELETS: 225 10*3/uL (ref 145–400)
RBC: 3.69 10*6/uL — AB (ref 3.70–5.32)
RDW: 11.3 % (ref 11.1–15.7)
WBC: 8.9 10*3/uL (ref 3.9–10.0)

## 2016-09-27 LAB — COMPREHENSIVE METABOLIC PANEL
ALT: 16 U/L (ref 0–55)
ANION GAP: 11 meq/L (ref 3–11)
AST: 20 U/L (ref 5–34)
Albumin: 3.8 g/dL (ref 3.5–5.0)
Alkaline Phosphatase: 80 U/L (ref 40–150)
BILIRUBIN TOTAL: 0.43 mg/dL (ref 0.20–1.20)
BUN: 8.6 mg/dL (ref 7.0–26.0)
CO2: 27 meq/L (ref 22–29)
CREATININE: 0.9 mg/dL (ref 0.6–1.1)
Calcium: 9.8 mg/dL (ref 8.4–10.4)
Chloride: 100 mEq/L (ref 98–109)
EGFR: 90 mL/min/{1.73_m2} — ABNORMAL LOW (ref >90)
GLUCOSE: 135 mg/dL (ref 70–140)
Potassium: 3.5 mEq/L (ref 3.5–5.1)
SODIUM: 138 meq/L (ref 136–145)
TOTAL PROTEIN: 7.6 g/dL (ref 6.4–8.3)

## 2016-09-27 NOTE — Progress Notes (Signed)
This  Hematology and Oncology Follow Up Visit  Eileen Holland [id number] [birth year] 56 y.o. 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 Apparently, she is having a tough time at work. This sounds like there are some interpersonal issues. 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 She just is frustrated.  As far as her DCIS is concerned, she is doing okay. She still well on Femara. She says she has occasional numbness in her hands. I think this might be more so from neurological compression. She says that this is intermittent.  Hot flashes are not as bad.  She has had no problems with cough or shortness of breath. She has had no issues with the flu. She's had no infections.  Overall, her performance status is ECOG 0.   Medications:  Allergies as of 09/27/2016      Reactions   Aspirin Hives   Ibuprofen Hives      Medication List       Accurate as of 09/27/16  9:16 AM. Always use your most recent med list.          ACETAMINOPHEN-BUTALBITAL 50-325 MG Tabs Take 1 tablet by mouth every 4 (four) hours as needed (headache.).   ALPRAZolam 0.5 MG tablet Commonly known as:  XANAX Take 0.5 mg by mouth 2 (two) times daily as needed for anxiety.   amLODipine 10 MG tablet Commonly known as:  NORVASC Take 10 mg by mouth every evening. @@7 :30 pm   B COMPLEX PO Take 1 tablet by mouth daily at 12 noon. (B complex-C-E-zinc)   docusate sodium 100 MG capsule Commonly known as:  COLACE Take 100 mg by mouth 2 (two) times daily.   fluticasone 27.5 MCG/SPRAY nasal spray Commonly known as:  VERAMYST Place 2 sprays into the nose every morning.   fluticasone 50 MCG/ACT nasal spray Commonly known as:  FLONASE Place 1 spray into the nose as needed.   hydrochlorothiazide 25 MG  tablet Commonly known as:  HYDRODIURIL Take 25 mg by mouth every morning.   ketoconazole 2 % cream Commonly known as:  NIZORAL Apply 1 application topically daily as needed for irritation.   letrozole 2.5 MG tablet Commonly known as:  FEMARA TAKE 1 TABLET(2.5 MG) BY MOUTH DAILY   levocetirizine 5 MG tablet Commonly known as:  XYZAL Take 5 mg by mouth daily at 12 noon. For allergies   losartan 100 MG tablet Commonly known as:  COZAAR Take 100 mg by mouth every morning.   megestrol 20 MG tablet Commonly known as:  MEGACE Take 1 tablet (20 mg total) by mouth daily.   metoprolol succinate 100 MG 24 hr tablet Commonly known as:  TOPROL-XL Take 100 mg by mouth every morning. Take with or immediately following a meal.   PARoxetine 20 MG tablet Commonly known as:  PAXIL Take 20 mg by mouth every morning.   polycarbophil 625 MG tablet Commonly known as:  FIBERCON Take 625 mg by mouth daily.   PRENATAL PO Take 1 tablet by mouth daily at 12 noon.   VITAMIN D HIGH POTENCY 1000 units capsule Generic drug:  Cholecalciferol Take 2,000 Units by mouth daily.       Allergies:  Allergies  Allergen Reactions  .  Aspirin Hives  . Ibuprofen Hives    Past Medical History, Surgical history, Social history, and Family History were reviewed and updated.  Review of Systems: All other 10 point review of systems is negative.   Physical Exam:  weight is 148 lb (67.1 kg). Her oral temperature is 98.3 F (36.8 C). Her blood pressure is 162/98 (abnormal) and her pulse is 59 (abnormal).   Wt Readings from Last 3 Encounters:  09/27/16 148 lb (67.1 kg)  04/22/16 151 lb (68.5 kg)  01/19/16 151 lb (68.5 kg)    Ocular: Sclerae unicteric, pupils equal, round and reactive to light Ear-nose-throat: Oropharynx clear, dentition fair Lymphatic: No cervical supraclavicular or axillary adenopathy Lungs no rales or rhonchi, good excursion bilaterally Heart regular rate and rhythm, no murmur  appreciated Abd soft, nontender, positive bowel sounds, no liver or spleen tip palpated on exam  MSK no focal spinal tenderness, no joint edema Neuro: non-focal, well-oriented, appropriate affect Breasts: She has fading hyperpigmentation of the right breast due to radiation that continues to fade, scar at the 10 o'clock position of the upper outer quadrant of the right breast has healed nicely. She has some tenderness in the area where she had radiation.No mass, lesion or rash found on exam. No changes in the left breast.   Lab Results  Component Value Date   WBC 8.9 09/27/2016   HGB 13.1 09/27/2016   HCT 38.6 09/27/2016   MCV 105 (H) 09/27/2016   PLT 225 09/27/2016   No results found for: FERRITIN, IRON, TIBC, UIBC, IRONPCTSAT Lab Results  Component Value Date   RBC 3.69 (L) 09/27/2016   No results found for: KPAFRELGTCHN, LAMBDASER, KAPLAMBRATIO No results found for: IGGSERUM, IGA, IGMSERUM No results found for: Kathrynn Ducking, MSPIKE, SPEI   Chemistry      Component Value Date/Time   NA 140 04/22/2016 1022   K 3.8 04/22/2016 1022   CL 103 10/19/2015 1158   CO2 28 04/22/2016 1022   BUN 13.3 04/22/2016 1022   CREATININE 0.9 04/22/2016 1022      Component Value Date/Time   CALCIUM 9.7 04/22/2016 1022   ALKPHOS 70 04/22/2016 1022   AST 21 04/22/2016 1022   ALT 16 04/22/2016 1022   BILITOT 0.44 04/22/2016 1022     Impression and Plan: Eileen Holland is a very pleasant 56 yo African American female with a ductal carcinoma in situ of the right breast. She underwent a lumpectomy in October 2016 followed by radiation. She is  postmenopausal and doing well on Femara.   Her hot flashes are better on Megace.  I think we probably get her back now in 6 months.   Volanda Napoleon, MD 2/13/20189:16 AM

## 2016-09-29 ENCOUNTER — Other Ambulatory Visit: Payer: Self-pay | Admitting: Family

## 2016-10-30 ENCOUNTER — Other Ambulatory Visit: Payer: Self-pay | Admitting: Family

## 2016-11-28 ENCOUNTER — Other Ambulatory Visit: Payer: Self-pay | Admitting: Family

## 2016-12-25 ENCOUNTER — Other Ambulatory Visit: Payer: Self-pay | Admitting: Family

## 2017-01-23 ENCOUNTER — Other Ambulatory Visit: Payer: Self-pay | Admitting: Family

## 2017-02-26 ENCOUNTER — Other Ambulatory Visit: Payer: Self-pay | Admitting: Family

## 2017-03-26 ENCOUNTER — Other Ambulatory Visit: Payer: Self-pay | Admitting: Family

## 2017-03-27 ENCOUNTER — Other Ambulatory Visit: Payer: Self-pay | Admitting: Family

## 2017-03-28 ENCOUNTER — Ambulatory Visit (HOSPITAL_BASED_OUTPATIENT_CLINIC_OR_DEPARTMENT_OTHER): Payer: Managed Care, Other (non HMO) | Admitting: Family

## 2017-03-28 ENCOUNTER — Other Ambulatory Visit (HOSPITAL_BASED_OUTPATIENT_CLINIC_OR_DEPARTMENT_OTHER): Payer: Managed Care, Other (non HMO)

## 2017-03-28 VITALS — BP 128/84 | HR 60 | Temp 98.2°F | Resp 16 | Wt 153.0 lb

## 2017-03-28 DIAGNOSIS — C50411 Malignant neoplasm of upper-outer quadrant of right female breast: Secondary | ICD-10-CM

## 2017-03-28 DIAGNOSIS — Z170 Estrogen receptor positive status [ER+]: Secondary | ICD-10-CM

## 2017-03-28 DIAGNOSIS — D0511 Intraductal carcinoma in situ of right breast: Secondary | ICD-10-CM | POA: Diagnosis not present

## 2017-03-28 DIAGNOSIS — E559 Vitamin D deficiency, unspecified: Secondary | ICD-10-CM

## 2017-03-28 DIAGNOSIS — Z923 Personal history of irradiation: Secondary | ICD-10-CM

## 2017-03-28 DIAGNOSIS — Z79811 Long term (current) use of aromatase inhibitors: Secondary | ICD-10-CM

## 2017-03-28 LAB — CBC WITH DIFFERENTIAL (CANCER CENTER ONLY)
BASO#: 0.1 10*3/uL (ref 0.0–0.2)
BASO%: 0.8 % (ref 0.0–2.0)
EOS%: 2.6 % (ref 0.0–7.0)
Eosinophils Absolute: 0.2 10*3/uL (ref 0.0–0.5)
HEMATOCRIT: 38.7 % (ref 34.8–46.6)
HGB: 13.2 g/dL (ref 11.6–15.9)
LYMPH#: 1.7 10*3/uL (ref 0.9–3.3)
LYMPH%: 21.2 % (ref 14.0–48.0)
MCH: 35.7 pg — ABNORMAL HIGH (ref 26.0–34.0)
MCHC: 34.1 g/dL (ref 32.0–36.0)
MCV: 105 fL — ABNORMAL HIGH (ref 81–101)
MONO#: 0.5 10*3/uL (ref 0.1–0.9)
MONO%: 6.2 % (ref 0.0–13.0)
NEUT%: 69.2 % (ref 39.6–80.0)
NEUTROS ABS: 5.5 10*3/uL (ref 1.5–6.5)
PLATELETS: 230 10*3/uL (ref 145–400)
RBC: 3.7 10*6/uL (ref 3.70–5.32)
RDW: 11.3 % (ref 11.1–15.7)
WBC: 7.9 10*3/uL (ref 3.9–10.0)

## 2017-03-28 LAB — CMP (CANCER CENTER ONLY)
ALBUMIN: 3.8 g/dL (ref 3.3–5.5)
ALT(SGPT): 21 U/L (ref 10–47)
AST: 25 U/L (ref 11–38)
Alkaline Phosphatase: 81 U/L (ref 26–84)
BILIRUBIN TOTAL: 0.7 mg/dL (ref 0.20–1.60)
BUN: 11 mg/dL (ref 7–22)
CO2: 30 meq/L (ref 18–33)
CREATININE: 1.1 mg/dL (ref 0.6–1.2)
Calcium: 9.8 mg/dL (ref 8.0–10.3)
Chloride: 103 mEq/L (ref 98–108)
Glucose, Bld: 110 mg/dL (ref 73–118)
Potassium: 4 mEq/L (ref 3.3–4.7)
SODIUM: 139 meq/L (ref 128–145)
TOTAL PROTEIN: 8 g/dL (ref 6.4–8.1)

## 2017-03-28 NOTE — Progress Notes (Signed)
Hematology and Oncology Follow Up Visit  Eileen Holland [id number] Dec 05, 1960 56 y.o. 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 She is doing well and has no complaints at this time. Her breast exam today was negative. No mass, lesion or rash. Lumpectomy scar at the 10 o'clock position of the outer portion or the right breast was intact.  She verbalized that she has had no issue on Femara and that she is taking daily as prescribed.  No fever, chills, n/v, cough, rash, dizziness, SOB, chest pain, palpitations, abdominal pain or changes in bowel or bladder habits. She still has occasional constipation.  No swelling or tenderness in her extremities. She has occasional numbness and tingling in the left hand which she states is positional.  She has maintained a good appetite and is staying well hydrated. Her weight is stable.   ECOG Performance Status: 0 - Asymptomatic  Medications:  Allergies as of 03/28/2017      Reactions   Aspirin Hives   Ibuprofen Hives      Medication List       Accurate as of 03/28/17 10:47 AM. Always use your most recent med list.          ACETAMINOPHEN-BUTALBITAL 50-325 MG Tabs Take 1 tablet by mouth every 4 (four) hours as needed (headache.).   ALPRAZolam 0.5 MG tablet Commonly known as:  XANAX Take 0.5 mg by mouth 2 (two) times daily as needed for anxiety.   amLODipine 10 MG tablet Commonly known as:  NORVASC Take 10 mg by mouth every evening. @@7 :30 pm   B COMPLEX PO Take 1 tablet by mouth daily at 12 noon. (B complex-C-E-zinc)   docusate sodium 100 MG capsule Commonly known as:  COLACE Take 100 mg by mouth 2 (two) times daily.   fluticasone 27.5 MCG/SPRAY nasal spray Commonly known as:  VERAMYST Place 2 sprays into the nose every  morning.   fluticasone 50 MCG/ACT nasal spray Commonly known as:  FLONASE Place 1 spray into the nose as needed.   hydrochlorothiazide 25 MG tablet Commonly known as:  HYDRODIURIL Take 25 mg by mouth every morning.   ketoconazole 2 % cream Commonly known as:  NIZORAL Apply 1 application topically daily as needed for irritation.   letrozole 2.5 MG tablet Commonly known as:  FEMARA TAKE 1 TABLET BY MOUTH EVERY DAY   levocetirizine 5 MG tablet Commonly known as:  XYZAL Take 5 mg by mouth daily at 12 noon. For allergies   losartan 100 MG tablet Commonly known as:  COZAAR Take 100 mg by mouth every morning.   megestrol 20 MG tablet Commonly known as:  MEGACE Take 1 tablet (20 mg total) by mouth daily.   metoprolol succinate 100 MG 24 hr tablet Commonly known as:  TOPROL-XL Take 100 mg by mouth every morning. Take with or immediately following a meal.   PARoxetine 20 MG tablet Commonly known as:  PAXIL Take 20 mg by mouth every morning.   polycarbophil 625 MG tablet Commonly known as:  FIBERCON Take 625 mg by mouth daily.   PRENATAL PO Take 1 tablet by mouth daily at 12 noon.   VITAMIN D HIGH POTENCY 1000 units capsule Generic drug:  Cholecalciferol Take 2,000 Units by mouth daily.       Allergies:  Allergies  Allergen Reactions  . Aspirin Hives  . Ibuprofen Hives    Past Medical History, Surgical history, Social history, and Family History were reviewed and updated.  Review of Systems: All other 10 point review of systems is negative.   Physical Exam:  vitals were not taken for this visit.  Wt Readings from Last 3 Encounters:  09/27/16 148 lb (67.1 kg)  04/22/16 151 lb (68.5 kg)  01/19/16 151 lb (68.5 kg)    Ocular: Sclerae unicteric, pupils equal, round and reactive to light Ear-nose-throat: Oropharynx clear, dentition fair Lymphatic: No cervical, supraclavicular or axillary adenopathy Lungs no rales or rhonchi, good excursion bilaterally Heart  regular rate and rhythm, no murmur appreciated Abd soft, nontender, positive bowel sounds, no liver or spleen tip palpated on exam, no fluid wave MSK no focal spinal tenderness, no joint edema Neuro: non-focal, well-oriented, appropriate affect Breasts: Bilateral breast exam negative. No mass lesion or rash. Lumpectomy scar at the 10 o'clock position of the right breast intact.   Lab Results  Component Value Date   WBC 8.9 09/27/2016   HGB 13.1 09/27/2016   HCT 38.6 09/27/2016   MCV 105 (H) 09/27/2016   PLT 225 09/27/2016   No results found for: FERRITIN, IRON, TIBC, UIBC, IRONPCTSAT Lab Results  Component Value Date   RBC 3.69 (L) 09/27/2016   No results found for: KPAFRELGTCHN, LAMBDASER, KAPLAMBRATIO No results found for: IGGSERUM, IGA, IGMSERUM No results found for: Ronnald Ramp, A1GS, A2GS, Tillman Sers, SPEI   Chemistry      Component Value Date/Time   NA 138 09/27/2016 0820   K 3.5 09/27/2016 0820   CL 103 10/19/2015 1158   CO2 27 09/27/2016 0820   BUN 8.6 09/27/2016 0820   CREATININE 0.9 09/27/2016 0820      Component Value Date/Time   CALCIUM 9.8 09/27/2016 0820   ALKPHOS 80 09/27/2016 0820   AST 20 09/27/2016 0820   ALT 16 09/27/2016 0820   BILITOT 0.43 09/27/2016 0820      Impression and Plan: Eileen Holland is a very pleasant 56 yo African American female with history of ductal carcinoma in situ of the right breast. She had her lumpectomy in October of 2016 followed by radiation and has done well. So far there has been no sign of recurrence.  She is doing well on Femara and has no complaints at this time. Her breast exam today was negative.  She is due again for mammogram later this month and will schedule herself. Order placed.  We will plan to see her back again in another 6 months for repeat lab work and follow-up.  She will contact our office with any questions or concerns. We can certainly see her sooner if need be.    Eliezer Bottom, NP 8/14/201810:47 AM

## 2017-03-29 LAB — VITAMIN D 25 HYDROXY (VIT D DEFICIENCY, FRACTURES): Vitamin D, 25-Hydroxy: 51.9 ng/mL (ref 30.0–100.0)

## 2017-05-19 ENCOUNTER — Encounter: Payer: Self-pay | Admitting: Gastroenterology

## 2017-05-30 ENCOUNTER — Ambulatory Visit
Admission: RE | Admit: 2017-05-30 | Discharge: 2017-05-30 | Disposition: A | Discharge status: Home / Self Care | Payer: Managed Care, Other (non HMO) | Source: Ambulatory Visit | Attending: Family | Admitting: Family

## 2017-05-30 DIAGNOSIS — C50411 Malignant neoplasm of upper-outer quadrant of right female breast: Secondary | ICD-10-CM

## 2017-05-30 HISTORY — DX: Personal history of irradiation: Z92.3

## 2017-06-01 IMAGING — MG 2D DIGITAL DIAGNOSTIC BILATERAL MAMMOGRAM WITH CAD AND ADJUNCT T
9 of 14 series · 9 of 30 positions shown · non-contrast
Comparison: Previous exam(s).

CLINICAL DATA: 54-year-old female for annual bilateral mammograms.
History of right breast cancer and lumpectomy in 3351.

EXAM:
2D DIGITAL DIAGNOSTIC BILATERAL MAMMOGRAM WITH CAD AND ADJUNCT TOMO

[R MLO (1 of 2)]
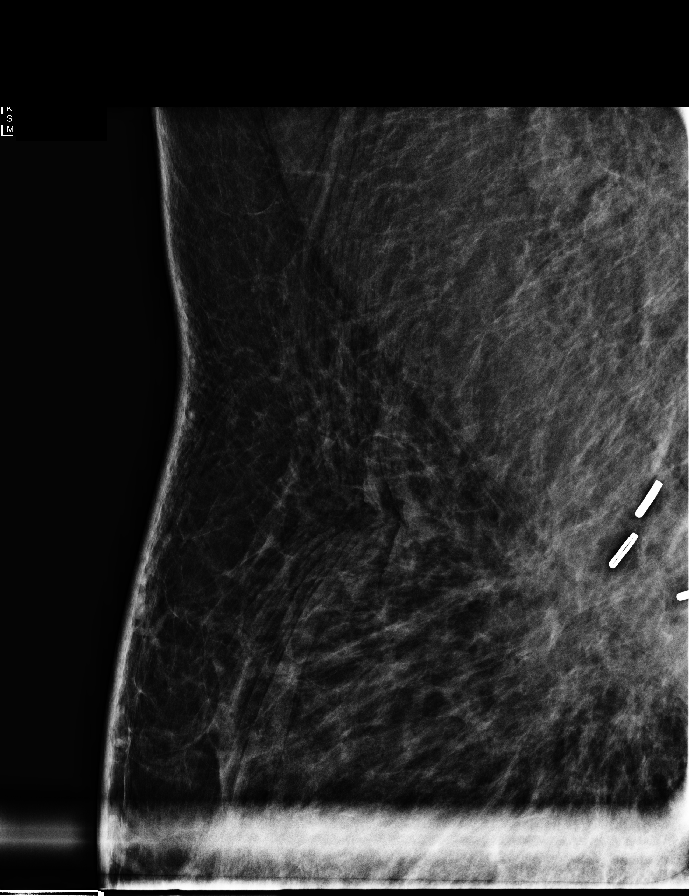

[R MLO (2 of 2)]
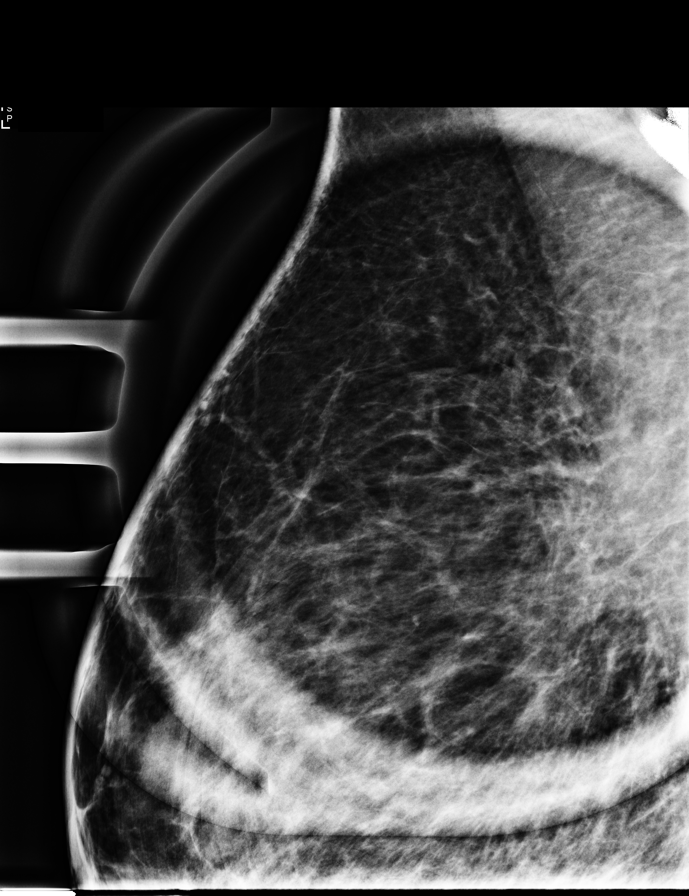

[L CC synth-2D]
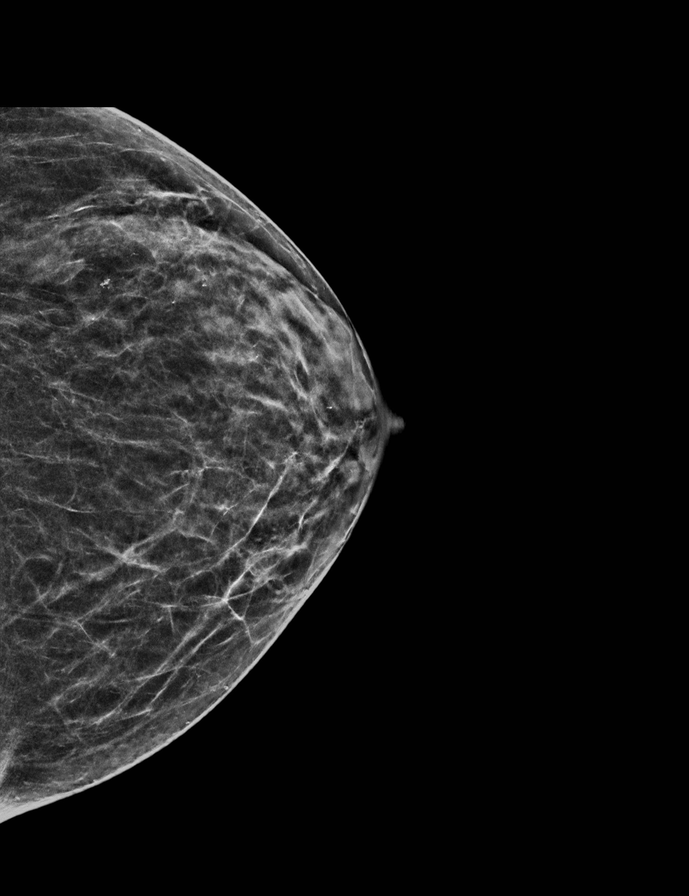

[L CC]
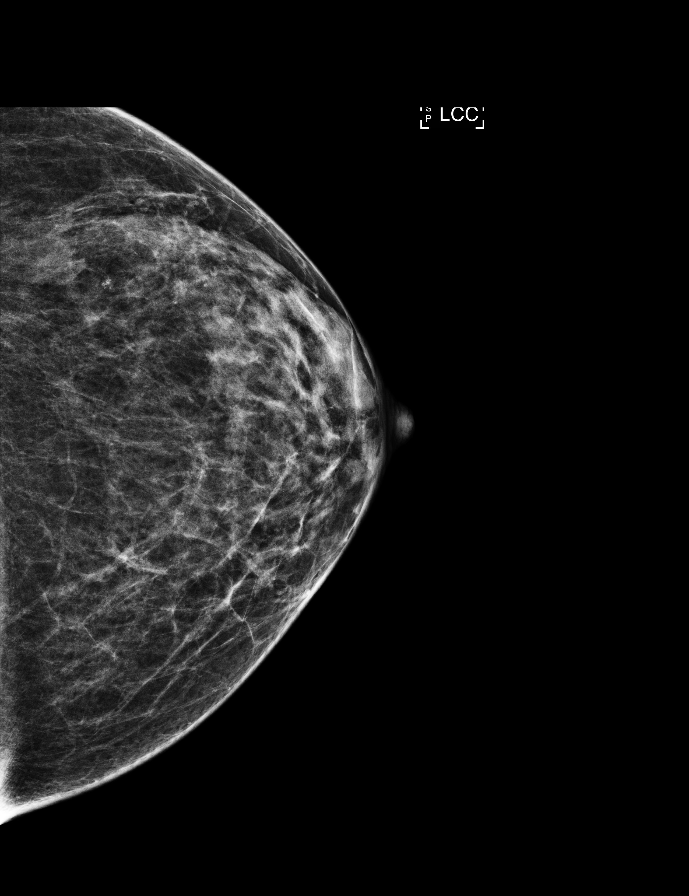

[R CC synth-2D]
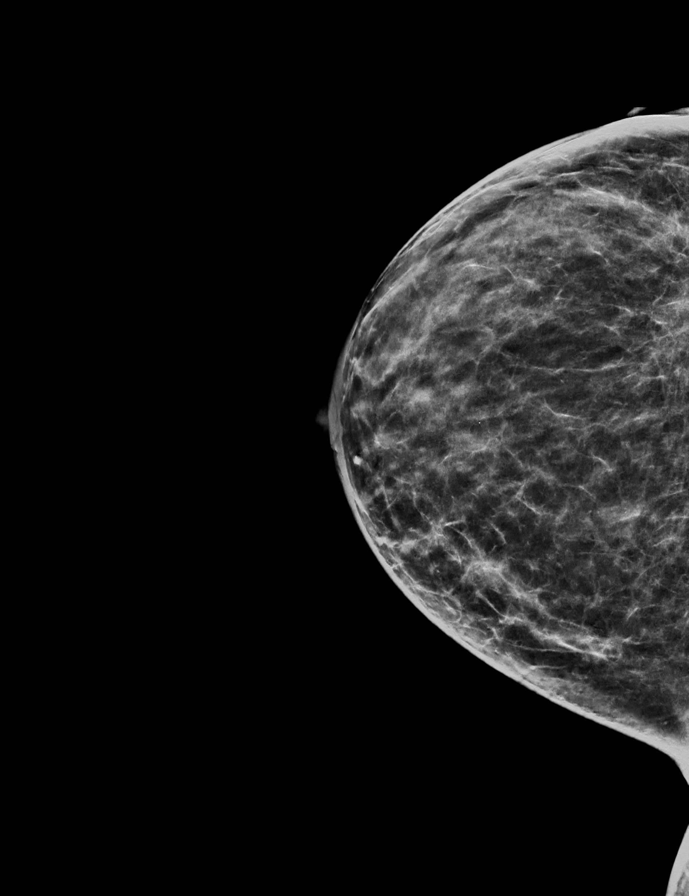

[R MLO synth-2D]
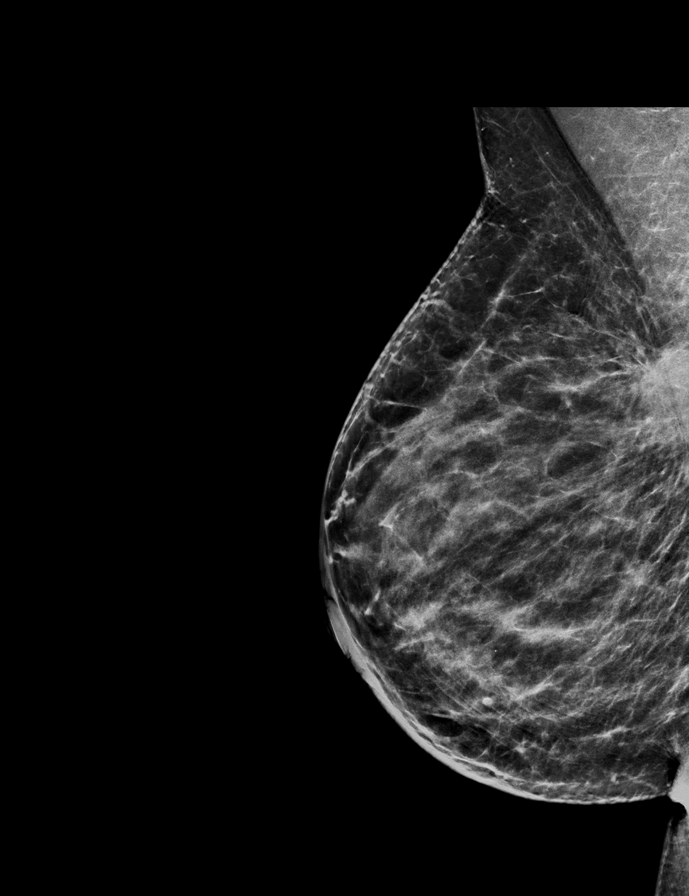

[L MLO]
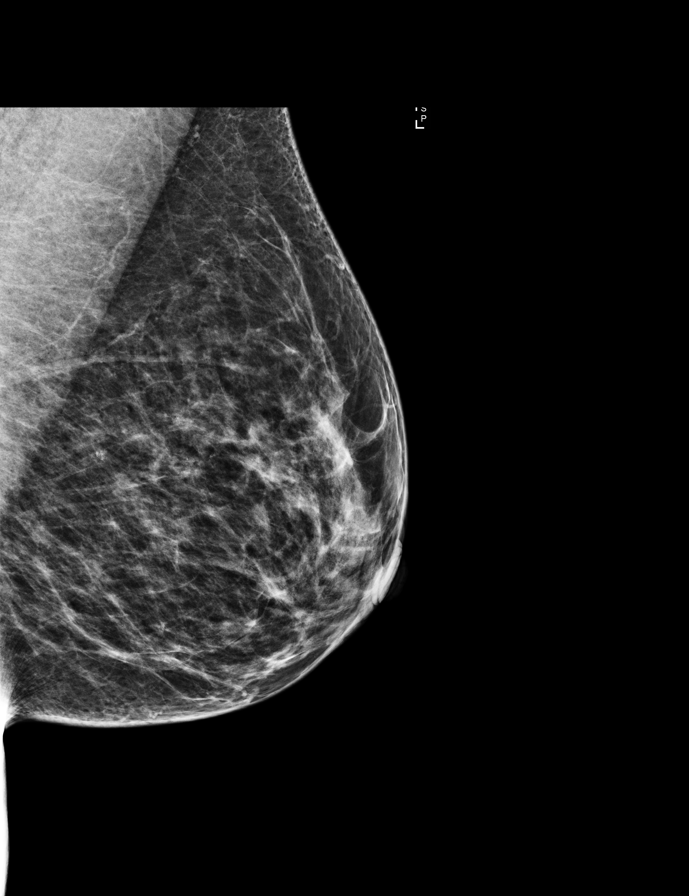

[L MLO synth-2D]
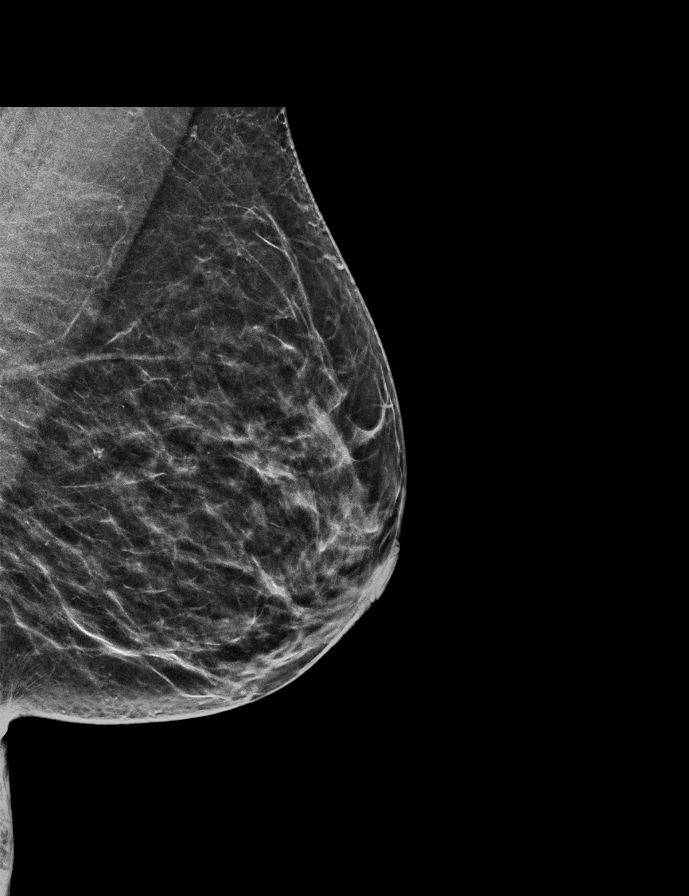

[R CC]
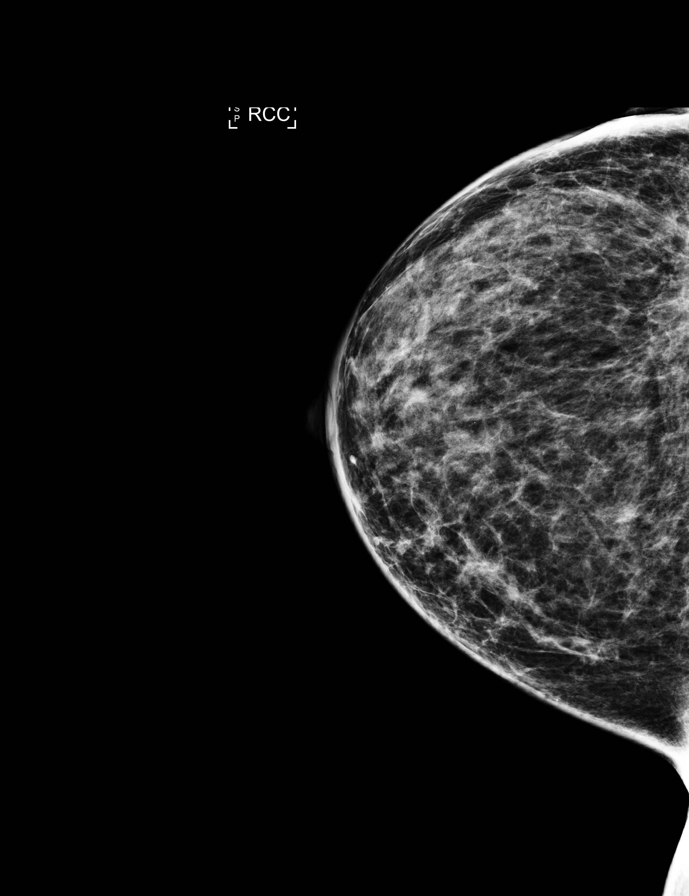

[9 of 30 positions shown; findings below may reference images not displayed]

ACR Breast Density Category b: There are scattered areas of
fibroglandular density.
FINDINGS: 2D and 3D full field views of both breasts and magnification views
of the right breast demonstrate no suspicious mass, nonsurgical
distortion or worrisome calcifications.

Right breast scarring again noted.

Mammographic images were processed with CAD.
IMPRESSION: No mammographic evidence of breast malignancy.

RECOMMENDATION:
Bilateral diagnostic mammograms in 1 year.

I have discussed the findings and recommendations with the patient.
Results were also provided in writing at the conclusion of the
visit. If applicable, a reminder letter will be sent to the patient
regarding the next appointment.

BI-RADS CATEGORY  2: Benign.

## 2017-07-04 ENCOUNTER — Other Ambulatory Visit: Payer: Self-pay

## 2017-07-04 ENCOUNTER — Ambulatory Visit (AMBULATORY_SURGERY_CENTER): Payer: Self-pay | Admitting: *Deleted

## 2017-07-04 VITALS — Ht 62.5 in | Wt 158.0 lb

## 2017-07-04 DIAGNOSIS — Z86010 Personal history of colonic polyps: Secondary | ICD-10-CM

## 2017-07-04 MED ORDER — NA SULFATE-K SULFATE-MG SULF 17.5-3.13-1.6 GM/177ML PO SOLN: 1.0000 | Freq: Once | ORAL | 0 refills | Status: AC

## 2017-07-04 NOTE — Progress Notes (Signed)
No egg or soy allergy known to patient  No issues with past sedation with any surgeries  or procedures, no intubation problems  No diet pills per patient No home 02 use per patient  No blood thinners per patient  Pt states  issues with constipation but uses stool softener , prn miralax and fiber OTC - stools daily and they are soft  No A fib or A flutter  EMMI video sent to pt's e mail

## 2017-07-12 ENCOUNTER — Encounter: Payer: Self-pay | Admitting: Gastroenterology

## 2017-07-18 ENCOUNTER — Encounter: Payer: Self-pay | Admitting: Gastroenterology

## 2017-07-18 ENCOUNTER — Ambulatory Visit (AMBULATORY_SURGERY_CENTER): Payer: Managed Care, Other (non HMO) | Admitting: Gastroenterology

## 2017-07-18 ENCOUNTER — Other Ambulatory Visit: Payer: Self-pay

## 2017-07-18 VITALS — BP 174/78 | HR 59 | Temp 97.5°F | Resp 16 | Ht 62.5 in | Wt 153.0 lb

## 2017-07-18 DIAGNOSIS — D123 Benign neoplasm of transverse colon: Secondary | ICD-10-CM

## 2017-07-18 DIAGNOSIS — Z86010 Personal history of colonic polyps: Secondary | ICD-10-CM

## 2017-07-18 DIAGNOSIS — D122 Benign neoplasm of ascending colon: Secondary | ICD-10-CM

## 2017-07-18 DIAGNOSIS — K635 Polyp of colon: Secondary | ICD-10-CM | POA: Diagnosis not present

## 2017-07-18 DIAGNOSIS — D125 Benign neoplasm of sigmoid colon: Secondary | ICD-10-CM

## 2017-07-18 MED ORDER — SODIUM CHLORIDE 0.9 % IV SOLN: 500.0000 mL | INTRAVENOUS | Status: DC

## 2017-07-18 NOTE — Progress Notes (Signed)
Called to room to assist during endoscopic procedure.  Patient ID and intended procedure confirmed with present staff. Received instructions for my participation in the procedure from the performing physician.  

## 2017-07-18 NOTE — Progress Notes (Signed)
Pt's states no medical or surgical changes since previsit or office visit. 

## 2017-07-18 NOTE — Patient Instructions (Signed)
YOU HAD AN ENDOSCOPIC PROCEDURE TODAY AT [facility]:   Refer to the procedure report that was given to you for any specific questions about what was found during the examination.  If the procedure report does not answer your questions, please call your gastroenterologist to clarify.  If you requested that your care partner not be given the details of your procedure findings, then the procedure report has been included in a sealed envelope for you to review at your convenience later.  YOU SHOULD EXPECT: Some feelings of bloating in the abdomen. Passage of more gas than usual.  Walking can help get rid of the air that was put into your GI tract during the procedure and reduce the bloating. If you had a lower endoscopy (such as a colonoscopy or flexible sigmoidoscopy) you may notice spotting of blood in your stool or on the toilet paper. If you underwent a bowel prep for your procedure, you may not have a normal bowel movement for a few days.  Please Note:  You might notice some irritation and congestion in your nose or some drainage.  This is from the oxygen used during your procedure.  There is no need for concern and it should clear up in a day or so.  SYMPTOMS TO REPORT IMMEDIATELY:   Following lower endoscopy (colonoscopy or flexible sigmoidoscopy):  Excessive amounts of blood in the stool  Significant tenderness or worsening of abdominal pains  Swelling of the abdomen that is new, acute  Fever of 100F or higher  For urgent or emergent issues, a gastroenterologist can be reached at any hour by calling [phone].   DIET:  We do recommend a small meal at first, but then you may proceed to your regular diet.  Drink plenty of fluids but you should avoid alcoholic beverages for 24 hours.  ACTIVITY:  You should plan to take it easy for the rest of today and you should NOT DRIVE or use heavy machinery until tomorrow (because of the sedation medicines used during the test).     FOLLOW UP: Our staff will call the number listed on your records the next business day following your procedure to check on you and address any questions or concerns that you may have regarding the information given to you following your procedure. If we do not reach you, we will leave a message.  However, if you are feeling well and you are not experiencing any problems, there is no need to return our call.  We will assume that you have returned to your regular daily activities without incident.  If any biopsies were taken you will be contacted by phone or by letter within the next 1-3 weeks.  Please call us at [phone] if you have not heard about the biopsies in 3 weeks.   Await for biopsy results to determine next repeat Colonoscopy screening Polyps (handout given) Hemorrhoids (handout given) Diverticulosis (handout given) High Fiber Diet (handout given) SIGNATURES/CONFIDENTIALITY: You and/or your care partner have signed paperwork which will be entered into your electronic medical record.  These signatures attest to the fact that that the information above on your After Visit Summary has been reviewed and is understood.  Full responsibility of the confidentiality of this discharge information lies with you and/or your care-partner.

## 2017-07-18 NOTE — Progress Notes (Signed)
Report given to PACU, vss 

## 2017-07-18 NOTE — Op Note (Signed)
[place] Patient Name: Eileen Holland Procedure Date: 07/18/2017 9:48 AM MRN: [id number] Endoscopist: Ladene Artist , MD Age: 56 Referring MD:  Date of Birth: 1961/04/09 Gender: Female Account #: [id number] Procedure:                Colonoscopy Indications:              Surveillance: Personal history of adenomatous                            polyps on last colonoscopy 3 years ago Medicines:                Monitored Anesthesia Care Procedure:                Pre-Anesthesia Assessment:                           - Prior to the procedure, a History and Physical                            was performed, and patient medications and                            allergies were reviewed. The patient's tolerance of                            previous anesthesia was also reviewed. The risks                            and benefits of the procedure and the sedation                            options and risks were discussed with the patient.                            All questions were answered, and informed consent                            was obtained. Prior Anticoagulants: The patient has                            taken no previous anticoagulant or antiplatelet                            agents. ASA Grade Assessment: II - A patient with                            mild systemic disease. After reviewing the risks                            and benefits, the patient was deemed in                            satisfactory condition to undergo the procedure.  After obtaining informed consent, the colonoscope                            was passed under direct vision. Throughout the                            procedure, the patient's blood pressure, pulse, and                            oxygen saturations were monitored continuously. The                            Colonoscope was introduced through the anus and                            advanced to the the  neo-terminal ileum. The quality                            of the bowel preparation was adequate. The                            colonoscopy was performed without difficulty. The                            patient tolerated the procedure well. The terminal                            ileum and the rectum were photographed. Scope In: 1:70:01 AM Scope Out: 10:10:21 AM Scope Withdrawal Time: 0 hours 11 minutes 5 seconds  Total Procedure Duration: 0 hours 13 minutes 22 seconds  Findings:                 The perianal and digital rectal examinations were                            normal.                           There was evidence of a prior end-to-side                            ileo-colonic anastomosis in the ascending colon.                            This was patent and was characterized by healthy                            appearing mucosa. The anastomosis was traversed.                           The neo-terminal ileum appeared normal.                           A 6 mm polyp was found in the transverse colon.  Possibly an inverted diverticulum or polyp on an                            inverted diverticulum. The polyp was sessile. The                            polyp was removed with a cold snare. Resection and                            retrieval were complete.                           Two sessile polyps were found in the sigmoid colon                            and ascending colon. The polyps were 6 mm in size.                            These polyps were removed with a cold snare.                            Resection and retrieval were complete.                           Multiple medium-mouthed diverticula were found in                            the left colon. There was no evidence of                            diverticular bleeding.                           Internal hemorrhoids were found during                            retroflexion. The hemorrhoids  were large and Grade                            III (internal hemorrhoids that prolapse but require                            manual reduction).                           The exam was otherwise without abnormality on                            direct and retroflexion views. Complications:            No immediate complications. Estimated blood loss:                            None. Estimated Blood Loss:     Estimated blood loss: none. Impression:               -  Patent end-to-side ileo-colonic anastomosis,                            characterized by healthy appearing mucosa.                           - The examined portion of the ileum was normal.                           - One 6 mm polyp in the transverse colon, removed                            with a cold snare. Resected and retrieved.                           - Two 6 mm polyps in the sigmoid colon and in the                            ascending colon, removed with a cold snare.                            Resected and retrieved.                           - Moderate diverticulosis in the left colon. There                            was no evidence of diverticular bleeding.                           - Internal hemorrhoids.                           - The examination was otherwise normal on direct                            and retroflexion views. Recommendation:           - Repeat colonoscopy in 3 years for surveillance.                           - Patient has a contact number available for                            emergencies. The signs and symptoms of potential                            delayed complications were discussed with the                            patient. Return to normal activities tomorrow.                            Written discharge instructions were provided to the  patient.                           - High fiber diet.                           - Continue present medications.                            - Await pathology results. Ladene Artist, MD 07/18/2017 10:17:26 AM This report has been signed electronically.

## 2017-07-19 ENCOUNTER — Telehealth: Payer: Self-pay

## 2017-07-19 NOTE — Telephone Encounter (Signed)
No answer, left a voicemail.

## 2017-07-19 NOTE — Telephone Encounter (Signed)
Called [phone] and left a messaged we tried to reach pt for a follow up call. maw

## 2017-07-26 ENCOUNTER — Encounter: Payer: Self-pay | Admitting: Gastroenterology

## 2017-09-21 ENCOUNTER — Other Ambulatory Visit: Payer: Self-pay | Admitting: Family

## 2017-10-26 ENCOUNTER — Inpatient Hospital Stay (HOSPITAL_COMMUNITY): Payer: Managed Care, Other (non HMO)

## 2017-10-26 ENCOUNTER — Other Ambulatory Visit: Payer: Self-pay

## 2017-10-26 ENCOUNTER — Inpatient Hospital Stay (HOSPITAL_COMMUNITY)
Admission: EM | Admit: 2017-10-26 | Discharge: 2017-10-28 | DRG: 101 | Disposition: A | Discharge status: Home / Self Care | Payer: Managed Care, Other (non HMO) | Attending: Internal Medicine | Admitting: Internal Medicine

## 2017-10-26 ENCOUNTER — Emergency Department (HOSPITAL_COMMUNITY): Payer: Managed Care, Other (non HMO)

## 2017-10-26 ENCOUNTER — Encounter (HOSPITAL_COMMUNITY): Payer: Self-pay

## 2017-10-26 DIAGNOSIS — Z800 Family history of malignant neoplasm of digestive organs: Secondary | ICD-10-CM

## 2017-10-26 DIAGNOSIS — Z803 Family history of malignant neoplasm of breast: Secondary | ICD-10-CM | POA: Diagnosis not present

## 2017-10-26 DIAGNOSIS — F325 Major depressive disorder, single episode, in full remission: Secondary | ICD-10-CM | POA: Diagnosis not present

## 2017-10-26 DIAGNOSIS — Z90710 Acquired absence of both cervix and uterus: Secondary | ICD-10-CM

## 2017-10-26 DIAGNOSIS — Z853 Personal history of malignant neoplasm of breast: Secondary | ICD-10-CM

## 2017-10-26 DIAGNOSIS — Z886 Allergy status to analgesic agent status: Secondary | ICD-10-CM

## 2017-10-26 DIAGNOSIS — E7849 Other hyperlipidemia: Secondary | ICD-10-CM

## 2017-10-26 DIAGNOSIS — I639 Cerebral infarction, unspecified: Secondary | ICD-10-CM

## 2017-10-26 DIAGNOSIS — R569 Unspecified convulsions: Secondary | ICD-10-CM | POA: Diagnosis present

## 2017-10-26 DIAGNOSIS — Z8249 Family history of ischemic heart disease and other diseases of the circulatory system: Secondary | ICD-10-CM | POA: Diagnosis not present

## 2017-10-26 DIAGNOSIS — R4701 Aphasia: Secondary | ICD-10-CM | POA: Diagnosis not present

## 2017-10-26 DIAGNOSIS — Z801 Family history of malignant neoplasm of trachea, bronchus and lung: Secondary | ICD-10-CM | POA: Diagnosis not present

## 2017-10-26 DIAGNOSIS — Z888 Allergy status to other drugs, medicaments and biological substances status: Secondary | ICD-10-CM | POA: Diagnosis not present

## 2017-10-26 DIAGNOSIS — Z79811 Long term (current) use of aromatase inhibitors: Secondary | ICD-10-CM

## 2017-10-26 DIAGNOSIS — R2681 Unsteadiness on feet: Secondary | ICD-10-CM | POA: Diagnosis not present

## 2017-10-26 DIAGNOSIS — E785 Hyperlipidemia, unspecified: Secondary | ICD-10-CM | POA: Diagnosis not present

## 2017-10-26 DIAGNOSIS — R410 Disorientation, unspecified: Secondary | ICD-10-CM

## 2017-10-26 DIAGNOSIS — G43909 Migraine, unspecified, not intractable, without status migrainosus: Secondary | ICD-10-CM | POA: Diagnosis present

## 2017-10-26 DIAGNOSIS — F17210 Nicotine dependence, cigarettes, uncomplicated: Secondary | ICD-10-CM | POA: Diagnosis not present

## 2017-10-26 DIAGNOSIS — I10 Essential (primary) hypertension: Secondary | ICD-10-CM | POA: Diagnosis not present

## 2017-10-26 DIAGNOSIS — E876 Hypokalemia: Secondary | ICD-10-CM

## 2017-10-26 HISTORY — DX: Pure hypercholesterolemia, unspecified: E78.00

## 2017-10-26 HISTORY — DX: Unspecified convulsions: R56.9

## 2017-10-26 LAB — CBC
HCT: 37.4 % (ref 36.0–46.0)
HEMATOCRIT: 35.8 % — AB (ref 36.0–46.0)
HEMOGLOBIN: 13.1 g/dL (ref 12.0–15.0)
HEMOGLOBIN: 11.9 g/dL — AB (ref 12.0–15.0)
MCH: 36.1 pg — AB (ref 26.0–34.0)
MCH: 33.9 pg (ref 26.0–34.0)
MCHC: 35 g/dL (ref 30.0–36.0)
MCHC: 33.2 g/dL (ref 30.0–36.0)
MCV: 103 fL — ABNORMAL HIGH (ref 78.0–100.0)
MCV: 102 fL — AB (ref 78.0–100.0)
PLATELETS: 239 10*3/uL (ref 150–400)
Platelets: 218 10*3/uL (ref 150–400)
RBC: 3.63 MIL/uL — AB (ref 3.87–5.11)
RBC: 3.51 MIL/uL — ABNORMAL LOW (ref 3.87–5.11)
RDW: 11.9 % (ref 11.5–15.5)
RDW: 11.7 % (ref 11.5–15.5)
WBC: 13.6 10*3/uL — AB (ref 4.0–10.5)
WBC: 12.7 10*3/uL — ABNORMAL HIGH (ref 4.0–10.5)

## 2017-10-26 LAB — DIFFERENTIAL
BASOS ABS: 0 10*3/uL (ref 0.0–0.1)
BASOS PCT: 0 %
EOS ABS: 0 10*3/uL (ref 0.0–0.7)
Eosinophils Relative: 0 %
LYMPHS ABS: 1.7 10*3/uL (ref 0.7–4.0)
LYMPHS PCT: 13 %
MONO ABS: 0.7 10*3/uL (ref 0.1–1.0)
MONOS PCT: 5 %
NEUTROS ABS: 11.1 10*3/uL — AB (ref 1.7–7.7)
NEUTROS PCT: 82 %

## 2017-10-26 LAB — URINALYSIS, ROUTINE W REFLEX MICROSCOPIC
Bacteria, UA: NONE SEEN
Bilirubin Urine: NEGATIVE
Glucose, UA: NEGATIVE mg/dL
Ketones, ur: NEGATIVE mg/dL
Nitrite: NEGATIVE
PROTEIN: NEGATIVE mg/dL
Specific Gravity, Urine: 1.004 — ABNORMAL LOW (ref 1.005–1.030)
pH: 7 (ref 5.0–8.0)

## 2017-10-26 LAB — RAPID URINE DRUG SCREEN, HOSP PERFORMED
Amphetamines: NOT DETECTED
BENZODIAZEPINES: NOT DETECTED
Barbiturates: POSITIVE — AB
COCAINE: NOT DETECTED
Opiates: NOT DETECTED
Tetrahydrocannabinol: NOT DETECTED

## 2017-10-26 LAB — COMPREHENSIVE METABOLIC PANEL
ALT: 22 U/L (ref 14–54)
ANION GAP: 10 (ref 5–15)
AST: 37 U/L (ref 15–41)
Albumin: 4.3 g/dL (ref 3.5–5.0)
Alkaline Phosphatase: 67 U/L (ref 38–126)
BUN: 8 mg/dL (ref 6–20)
CO2: 28 mmol/L (ref 22–32)
Calcium: 9.8 mg/dL (ref 8.9–10.3)
Chloride: 99 mmol/L — ABNORMAL LOW (ref 101–111)
Creatinine, Ser: 0.79 mg/dL (ref 0.44–1.00)
Glucose, Bld: 90 mg/dL (ref 65–99)
POTASSIUM: 3.2 mmol/L — AB (ref 3.5–5.1)
Sodium: 137 mmol/L (ref 135–145)
Total Bilirubin: 0.7 mg/dL (ref 0.3–1.2)
Total Protein: 8.1 g/dL (ref 6.5–8.1)

## 2017-10-26 LAB — I-STAT CHEM 8, ED
BUN: 7 mg/dL (ref 6–20)
CHLORIDE: 99 mmol/L — AB (ref 101–111)
CREATININE: 0.8 mg/dL (ref 0.44–1.00)
Calcium, Ion: 1.16 mmol/L (ref 1.15–1.40)
Glucose, Bld: 85 mg/dL (ref 65–99)
HEMATOCRIT: 40 % (ref 36.0–46.0)
Hemoglobin: 13.6 g/dL (ref 12.0–15.0)
Potassium: 3.2 mmol/L — ABNORMAL LOW (ref 3.5–5.1)
Sodium: 138 mmol/L (ref 135–145)
TCO2: 28 mmol/L (ref 22–32)

## 2017-10-26 LAB — ETHANOL: Alcohol, Ethyl (B): <10 mg/dL (ref <10)

## 2017-10-26 LAB — I-STAT TROPONIN, ED: TROPONIN I, POC: 0 ng/mL (ref 0.00–0.08)

## 2017-10-26 LAB — APTT: aPTT: 31 seconds (ref 24–36)

## 2017-10-26 LAB — PROTIME-INR
INR: 1
Prothrombin Time: 13.1 seconds (ref 11.4–15.2)

## 2017-10-26 LAB — CREATININE, SERUM
Creatinine, Ser: 0.82 mg/dL (ref 0.44–1.00)
GFR calc Af Amer: >60 mL/min (ref >60)
GFR calc non Af Amer: >60 mL/min (ref >60)

## 2017-10-26 MED ORDER — B COMPLEX PO TABS: 1.0000 | ORAL_TABLET | Freq: Every day | ORAL | Status: DC

## 2017-10-26 MED ORDER — SODIUM CHLORIDE 0.9 % IJ SOLN
INTRAMUSCULAR | Status: AC
  Filled 2017-10-26: qty 50

## 2017-10-26 MED ORDER — ENOXAPARIN SODIUM 40 MG/0.4ML ~~LOC~~ SOLN
40.0000 mg | SUBCUTANEOUS | Status: DC
  Administered 2017-10-26 – 2017-10-27 (×2): 40 mg via SUBCUTANEOUS
  Filled 2017-10-26 (×2): qty 0.4

## 2017-10-26 MED ORDER — DOCUSATE SODIUM 100 MG PO CAPS
100.0000 mg | ORAL_CAPSULE | Freq: Two times a day (BID) | ORAL | Status: DC
  Administered 2017-10-26 – 2017-10-28 (×4): 100 mg via ORAL
  Filled 2017-10-26 (×4): qty 1

## 2017-10-26 MED ORDER — PAROXETINE HCL 20 MG PO TABS
20.0000 mg | ORAL_TABLET | ORAL | Status: DC
  Administered 2017-10-27 – 2017-10-28 (×2): 20 mg via ORAL
  Filled 2017-10-26 (×2): qty 1

## 2017-10-26 MED ORDER — CLOPIDOGREL BISULFATE 75 MG PO TABS: 75.0000 mg | ORAL_TABLET | Freq: Every day | ORAL | Status: DC

## 2017-10-26 MED ORDER — IOPAMIDOL (ISOVUE-370) INJECTION 76%
INTRAVENOUS | Status: AC
  Filled 2017-10-26: qty 100

## 2017-10-26 MED ORDER — SODIUM CHLORIDE 0.9 % IV SOLN
INTRAVENOUS | Status: DC
  Administered 2017-10-26 – 2017-10-27 (×2): via INTRAVENOUS

## 2017-10-26 MED ORDER — ATORVASTATIN CALCIUM 80 MG PO TABS
80.0000 mg | ORAL_TABLET | Freq: Every day | ORAL | Status: DC
  Administered 2017-10-27: 80 mg via ORAL
  Filled 2017-10-26: qty 1

## 2017-10-26 MED ORDER — ACETAMINOPHEN 650 MG RE SUPP: 650.0000 mg | RECTAL | Status: DC | PRN

## 2017-10-26 MED ORDER — POTASSIUM CHLORIDE CRYS ER 20 MEQ PO TBCR
40.0000 meq | EXTENDED_RELEASE_TABLET | Freq: Once | ORAL | Status: AC
  Administered 2017-10-26: 40 meq via ORAL
  Filled 2017-10-26: qty 2

## 2017-10-26 MED ORDER — ACETAMINOPHEN 325 MG PO TABS: 650.0000 mg | ORAL_TABLET | ORAL | Status: DC | PRN

## 2017-10-26 MED ORDER — CLOPIDOGREL BISULFATE 75 MG PO TABS
75.0000 mg | ORAL_TABLET | Freq: Every day | ORAL | Status: DC
  Administered 2017-10-27 – 2017-10-28 (×2): 75 mg via ORAL
  Filled 2017-10-26 (×2): qty 1

## 2017-10-26 MED ORDER — SENNOSIDES-DOCUSATE SODIUM 8.6-50 MG PO TABS: 1.0000 | ORAL_TABLET | Freq: Every evening | ORAL | Status: DC | PRN

## 2017-10-26 MED ORDER — STROKE: EARLY STAGES OF RECOVERY BOOK
Freq: Once | Status: AC
  Administered 2017-10-26: 1

## 2017-10-26 MED ORDER — ATORVASTATIN CALCIUM 40 MG PO TABS: 40.0000 mg | ORAL_TABLET | Freq: Every day | ORAL | Status: DC

## 2017-10-26 MED ORDER — IOPAMIDOL (ISOVUE-370) INJECTION 76%
100.0000 mL | Freq: Once | INTRAVENOUS | Status: AC | PRN
  Administered 2017-10-26: 100 mL via INTRAVENOUS

## 2017-10-26 MED ORDER — LORATADINE 10 MG PO TABS
10.0000 mg | ORAL_TABLET | Freq: Every day | ORAL | Status: DC
  Administered 2017-10-27 – 2017-10-28 (×2): 10 mg via ORAL
  Filled 2017-10-26 (×2): qty 1

## 2017-10-26 MED ORDER — LEVOCETIRIZINE DIHYDROCHLORIDE 5 MG PO TABS: 5.0000 mg | ORAL_TABLET | Freq: Every day | ORAL | Status: DC

## 2017-10-26 MED ORDER — VITAMIN D 1000 UNITS PO TABS
1000.0000 [IU] | ORAL_TABLET | Freq: Every day | ORAL | Status: DC
  Administered 2017-10-26 – 2017-10-28 (×3): 1000 [IU] via ORAL
  Filled 2017-10-26 (×3): qty 1

## 2017-10-26 MED ORDER — STROKE: EARLY STAGES OF RECOVERY BOOK
Freq: Once | Status: AC
  Administered 2017-10-26: 21:00:00
  Filled 2017-10-26: qty 1

## 2017-10-26 MED ORDER — ACETAMINOPHEN 160 MG/5ML PO SOLN: 650.0000 mg | ORAL | Status: DC | PRN

## 2017-10-26 NOTE — H&P (Signed)
HISTORY AND PHYSICAL       PATIENT DETAILS Name: Eileen Holland Age: 57 y.o. Sex: female Date of Birth: September 01, 1960 Admit Date: 10/26/2017 XTA:[id number], Jannifer Rodney, MD   Patient coming from: Home   CHIEF COMPLAINT:  Confusion, some expressive aphasia, unsteady gait Tongue bite-sometime last night  HPI: Eileen Holland is a 57 y.o. female with medical history significant of breast cancer on Femara, hypertension, dyslipidemia presented to the emergency room for evaluation of the above-noted complaints.  Per patient, she woke up around 5 AM this morning-she noted that her tongue was sore-upon further evaluation by her-realize she bit her tongue when she was sleeping.  When she tried to get up and ambulate to the bathroom-she was unsteady.  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  Her daughter then realized that the patient appeared somewhat confused, and was at times not able to express herself.  As the day went on-her gait was less unsteady but still not back to her baseline.  No fever, nausea, vomiting, diarrhea, abdominal pain   ED Course:  The emergency room-patient had a unremarkable CT angiogram of the head and neck-EDMD spoke with neurology-subsequently tried hospitalist consulted for inpatient admission and evaluation.  Note: Lives at: Home Mobility:Independent Chronic Indwelling Foley:no   REVIEW OF SYSTEMS:  Constitutional:   No  weight loss, night sweats,  Fevers, chills, fatigue.  HEENT:    No headaches, Dysphagia,Tooth/dental problems,Sore throat  Cardio-vascular: No chest pain,Orthopnea, PND,lower extremity edema, anasarca, palpitations  GI:  No heartburn,  indigestion, abdominal pain, nausea, vomiting, diarrhea, melena or hematochezia  Resp: No shortness of breath, cough, hemoptysis,plueritic chest pain.   Skin:  No rash or lesions.  GU:  No dysuria, change in color of urine, no urgency or frequency.  No flank pain.  Musculoskeletal: No joint pain or swelling.  No decreased range of motion.  No back pain.  Endocrine: No heat intolerance, no cold intolerance, no polyuria, no polydipsia  Psych: No change in mood or affect.    ALLERGIES:   Allergies  Allergen Reactions  . Aspirin Hives  . Ibuprofen Hives    PAST MEDICAL HISTORY: Past Medical History:  Diagnosis Date  . Allergy   . Anxiety    panic attacks per PCP history  . Breast calcification, right   . Cancer ([place])    right BR CA  with lumpectomy with radiation, no chemo - radiation in 2016  . Constipation    chronic- use stool softener and OTC fiber and prn miralax   . Depression   . Headache    hx of per PCP history  . Headache, migraine 07/04/2014   Last Assessment & Plan:  Refilled butalbital for rare use, once monthly as cannot take NSAIDs.  Advised if needs it more regularly, to follow-up and discuss management furtehr   . High cholesterol   . Hyperlipidemia   . Hypertension   . Personal history of radiation therapy 2016  . Radiation 07/13/15-08/03/15   right breast 4256 cGy    PAST SURGICAL HISTORY: Past Surgical History:  Procedure Laterality Date  . BREAST BIOPSY Right 04/28/2015  . BREAST LUMPECTOMY Right 06/09/2015  .  BREAST LUMPECTOMY WITH RADIOACTIVE SEED LOCALIZATION Right 06/09/2015   Procedure: RIGHT BREAST LUMPECTOMY WITH RADIOACTIVE SEED LOCALIZATION;  Surgeon: Erroll Luna, MD;  Location: [facility];  Service: General;  Laterality: Right;  . COLON SURGERY  07/17/2014  . COLONOSCOPY    . colonscopy     . KNEE ARTHROSCOPY Right 2000  . POLYPECTOMY  07/17/2014   polyp removed surgicallty   . VAGINAL HYSTERECTOMY  2012     MEDICATIONS AT HOME: Prior to Admission medications   Medication Sig Start Date End Date Taking? Authorizing Provider  ACETAMINOPHEN-BUTALBITAL 50-325 MG TABS Take 1 tablet by mouth every 4 (four) hours as needed (headache.).   Yes [provider]  amLODipine (NORVASC) 10 MG tablet Take 10 mg by mouth every evening. @'@7' :30 pm   Yes [provider]  B Complex Vitamins (B COMPLEX PO) Take 1 tablet by mouth daily at 12 noon. (B complex-C-E-zinc)   Yes [provider]  Cholecalciferol (VITAMIN D HIGH POTENCY) 1000 units capsule Take 1,000 Units daily by mouth.    Yes [provider]  docusate sodium (COLACE) 100 MG capsule Take 100 mg by mouth 2 (two) times daily.   Yes [provider]  hydrochlorothiazide 25 MG tablet Take 25 mg by mouth every morning.    Yes [provider]  ketoconazole (NIZORAL) 2 % cream Apply 1 application topically daily as needed for irritation.   Yes [provider]  letrozole (FEMARA) 2.5 MG tablet TAKE 1 TABLET BY MOUTH EVERY DAY 09/21/17  Yes [place], Holli Humbles, NP  levocetirizine (XYZAL) 5 MG tablet Take 5 mg by mouth daily at 12 noon. For allergies    Yes [provider]  losartan (COZAAR) 100 MG tablet Take 100 mg by mouth every morning.   Yes [provider]  metoprolol succinate (TOPROL-XL) 100 MG 24 hr tablet Take 100 mg by mouth every morning. Take with or immediately following a meal.   Yes [provider]  PARoxetine (PAXIL) 20 MG tablet Take 20 mg by mouth every morning.     Yes [provider]  pravastatin (PRAVACHOL) 20 MG tablet Take 20 mg by mouth daily.  03/26/17  Yes [provider]  Prenatal Vit-Fe Fumarate-FA (PRENATAL PO) Take 1 tablet by mouth daily at 12 noon.   Yes [provider]    FAMILY HISTORY: Family History  Problem Relation Age of Onset  . Lung cancer Father 57       former smoker  . Colon polyps Father         unspecified number  . Breast cancer Sister 30       negative BRCA testing in 2007  . Stomach cancer Maternal Uncle        dx. 50s-early 35s  . Heart attack Paternal Uncle   . Colon cancer Neg Hx   . Esophageal cancer Neg Hx   . Rectal cancer Neg Hx     SOCIAL HISTORY:  reports that she has been smoking cigarettes.  She has a 16.00 pack-year smoking history. she has never used smokeless tobacco. She reports that she drinks alcohol. She reports that she does not use drugs.  PHYSICAL EXAM: Blood pressure (!) 152/93, pulse (!) 58, temperature 98.5 F (36.9 C), temperature source Oral, resp. rate 16, height 5' 2.5" (1.588 m), weight 69.4 kg (153 lb), last menstrual period 03/14/2011, SpO2 98 %.  General appearance :Awake, alert, some amount of mild expressive Deborah Chalk is not as fluent as  a baseline.  She is not in any sort of distress.  Eyes:, pupils equally reactive to light and accomodation,no scleral icterus.Pink conjunctiva HEENT: Atraumatic and Normocephalic Neck: supple, no JVD. No cervical lymphadenopathy. No thyromegaly Resp:Good air entry bilaterally, no added sounds  CVS: S1 S2 regular, no murmurs.  GI: Bowel sounds present, Non tender and not distended with no gaurding, rigidity or rebound.No organomegaly Extremities: B/L Lower Ext shows no edema, both legs are warm to touch Neurology: No focal neurological deficits Psychiatric: Normal judgment and insight. Alert and oriented x 3. Normal mood. Musculoskeletal:gait appears to be normal.No digital cyanosis Skin:No Rash, warm and dry Wounds:N/A  LABS ON ADMISSION:  I have personally reviewed following labs and imaging studies  CBC: Recent Labs  Lab 10/26/17 1425 10/26/17 1432  WBC 13.6*  --   NEUTROABS 11.1*  --   HGB 13.1 13.6  HCT 37.4 40.0  MCV 103.0*  --   PLT 239  --     Basic Metabolic Panel: Recent Labs  Lab 10/26/17 1425 10/26/17 1432  NA 137 138  K 3.2* 3.2*  CL 99* 99*  CO2 28  --   GLUCOSE  90 85  BUN 8 7  CREATININE 0.79 0.80  CALCIUM 9.8  --     GFR: Estimated Creatinine Clearance: 73.4 mL/min (by C-G formula based on SCr of 0.8 mg/dL).  Liver Function Tests: Recent Labs  Lab 10/26/17 1425  AST 37  ALT 22  ALKPHOS 67  BILITOT 0.7  PROT 8.1  ALBUMIN 4.3   No results for input(s): LIPASE, AMYLASE in the last 168 hours. No results for input(s): AMMONIA in the last 168 hours.  Coagulation Profile: Recent Labs  Lab 10/26/17 1425  INR 1.00    Cardiac Enzymes: No results for input(s): CKTOTAL, CKMB, CKMBINDEX, TROPONINI in the last 168 hours.  BNP (last 3 results) No results for input(s): PROBNP in the last 8760 hours.  HbA1C: No results for input(s): HGBA1C in the last 72 hours.  CBG: No results for input(s): GLUCAP in the last 168 hours.  Lipid Profile: No results for input(s): CHOL, HDL, LDLCALC, TRIG, CHOLHDL, LDLDIRECT in the last 72 hours.  Thyroid Function Tests: No results for input(s): TSH, T4TOTAL, FREET4, T3FREE, THYROIDAB in the last 72 hours.  Anemia Panel: No results for input(s): VITAMINB12, FOLATE, FERRITIN, TIBC, IRON, RETICCTPCT in the last 72 hours.  Urine analysis:    Component Value Date/Time   COLORURINE YELLOW 10/26/2017 1505   APPEARANCEUR CLEAR 10/26/2017 1505   LABSPEC 1.004 (L) 10/26/2017 1505   PHURINE 7.0 10/26/2017 1505   GLUCOSEU NEGATIVE 10/26/2017 1505   HGBUR MODERATE (A) 10/26/2017 1505   BILIRUBINUR NEGATIVE 10/26/2017 1505   KETONESUR NEGATIVE 10/26/2017 1505   PROTEINUR NEGATIVE 10/26/2017 1505   NITRITE NEGATIVE 10/26/2017 1505   LEUKOCYTESUR TRACE (A) 10/26/2017 1505    Sepsis Labs: Lactic Acid, Venous No results found for: [place]   Microbiology: No results found for this or any previous visit (from the past 240 hour(s)).    RADIOLOGIC STUDIES ON ADMISSION: Ct Angio Head W Or Wo Contrast  Result Date: 10/26/2017 CLINICAL DATA:  Awoke with pain in the tongue and gait disturbance.  Slurred speech. Expressive aphasia. EXAM: CT ANGIOGRAPHY HEAD AND NECK TECHNIQUE: Multidetector CT imaging of the head and neck was performed using the standard protocol during bolus administration of intravenous contrast. Multiplanar CT image reconstructions and MIPs were obtained to evaluate the vascular anatomy. Carotid stenosis measurements (when applicable) are obtained utilizing  NASCET criteria, using the distal internal carotid diameter as the denominator. CONTRAST:  129m ISOVUE-370 IOPAMIDOL (ISOVUE-370) INJECTION 76% COMPARISON:  None. FINDINGS: CT HEAD FINDINGS Brain: The brain shows a normal appearance without evidence of malformation, atrophy, old or acute small or large vessel infarction, mass lesion, hemorrhage, hydrocephalus or extra-axial collection. Vascular: No hyperdense vessel. No evidence of atherosclerotic calcification. Skull: Normal.  No traumatic finding.  No focal bone lesion. Sinuses/Orbits: Sinuses are clear. Orbits appear normal. Mastoids are clear. Other: None significant CTA NECK FINDINGS Aortic arch: Negative Right carotid system: Common carotid artery widely patent to the bifurcation region. Carotid bifurcation normal without soft or calcified plaque. Cervical ICA is tortuous but normal. Left carotid system: Left common carotid artery widely patent to the bifurcation region. Carotid bifurcation is normal without soft or calcified plaque. Cervical ICA is tortuous but widely patent. Vertebral arteries: Both vertebral artery origins are widely patent. The vertebral arteries are approximately equal in size and widely patent through the cervical region to the foramen magnum. The vessels are quite tortuous. Skeleton: Ordinary mid cervical spondylosis. Other neck: No mass or lymphadenopathy. Upper chest: Negative Review of the MIP images confirms the above findings CTA HEAD FINDINGS Anterior circulation: Both internal carotid arteries are patent through the skull base and siphon regions.  Minimal atherosclerotic calcification in the carotid siphons but no stenosis. The anterior and middle cerebral vessels are patent without proximal stenosis, aneurysm or vascular malformation. No missing branch vessels identified. Fetal origin of both posterior cerebral arteries. Posterior circulation: Both vertebral arteries are patent through the foramen magnum to the basilar. The vertebrobasilar system is small, because of primary fetal origin of the posterior cerebral arteries. No posterior circulation branch vessel abnormality is seen. Venous sinuses: Patent and normal. Anatomic variants: None other. Delayed phase: Abnormal enhancement. Review of the MIP images confirms the above findings IMPRESSION: Normal appearance of the brain. No visible stroke. No sign of metastatic disease. Negative CT angiography. No evidence of atherosclerotic disease. No large or medium vessel occlusion. Electronically Signed   By: MNelson ChimesM.D.   On: 10/26/2017 15:41   Dg Chest 2 View  Result Date: 10/26/2017 CLINICAL DATA:  Possible seizure. EXAM: CHEST - 2 VIEW COMPARISON:  09/07/2015 FINDINGS: The cardiac silhouette is mildly enlarged. The lungs are normally to mildly hyperinflated. No airspace consolidation, edema, pleural effusion, pneumothorax is identified. No acute osseous abnormality is seen. IMPRESSION: No active cardiopulmonary disease. Electronically Signed   By: ALogan BoresM.D.   On: 10/26/2017 15:03   Ct Angio Neck W And/or Wo Contrast  Result Date: 10/26/2017 CLINICAL DATA:  Awoke with pain in the tongue and gait disturbance. Slurred speech. Expressive aphasia. EXAM: CT ANGIOGRAPHY HEAD AND NECK TECHNIQUE: Multidetector CT imaging of the head and neck was performed using the standard protocol during bolus administration of intravenous contrast. Multiplanar CT image reconstructions and MIPs were obtained to evaluate the vascular anatomy. Carotid stenosis measurements (when applicable) are obtained utilizing  NASCET criteria, using the distal internal carotid diameter as the denominator. CONTRAST:  1053mISOVUE-370 IOPAMIDOL (ISOVUE-370) INJECTION 76% COMPARISON:  None. FINDINGS: CT HEAD FINDINGS Brain: The brain shows a normal appearance without evidence of malformation, atrophy, old or acute small or large vessel infarction, mass lesion, hemorrhage, hydrocephalus or extra-axial collection. Vascular: No hyperdense vessel. No evidence of atherosclerotic calcification. Skull: Normal.  No traumatic finding.  No focal bone lesion. Sinuses/Orbits: Sinuses are clear. Orbits appear normal. Mastoids are clear. Other: None significant CTA NECK FINDINGS Aortic arch:  Negative Right carotid system: Common carotid artery widely patent to the bifurcation region. Carotid bifurcation normal without soft or calcified plaque. Cervical ICA is tortuous but normal. Left carotid system: Left common carotid artery widely patent to the bifurcation region. Carotid bifurcation is normal without soft or calcified plaque. Cervical ICA is tortuous but widely patent. Vertebral arteries: Both vertebral artery origins are widely patent. The vertebral arteries are approximately equal in size and widely patent through the cervical region to the foramen magnum. The vessels are quite tortuous. Skeleton: Ordinary mid cervical spondylosis. Other neck: No mass or lymphadenopathy. Upper chest: Negative Review of the MIP images confirms the above findings CTA HEAD FINDINGS Anterior circulation: Both internal carotid arteries are patent through the skull base and siphon regions. Minimal atherosclerotic calcification in the carotid siphons but no stenosis. The anterior and middle cerebral vessels are patent without proximal stenosis, aneurysm or vascular malformation. No missing branch vessels identified. Fetal origin of both posterior cerebral arteries. Posterior circulation: Both vertebral arteries are patent through the foramen magnum to the basilar. The  vertebrobasilar system is small, because of primary fetal origin of the posterior cerebral arteries. No posterior circulation branch vessel abnormality is seen. Venous sinuses: Patent and normal. Anatomic variants: None other. Delayed phase: Abnormal enhancement. Review of the MIP images confirms the above findings IMPRESSION: Normal appearance of the brain. No visible stroke. No sign of metastatic disease. Negative CT angiography. No evidence of atherosclerotic disease. No large or medium vessel occlusion. Electronically Signed   By: Nelson Chimes M.D.   On: 10/26/2017 15:41    I have personally reviewed images of chest xray or the CT head  EKG:  Personally reviewed.  Twelve-lead EKG  ASSESSMENT AND PLAN: Probable acute CVA/possible seizure: Patient with a constellation of findings of unsteady gait, mild expressive aphasia, some amount of confusion-apparently noted a tongue bite when she woke up this morning-we will need to get an MRI of her brain to rule out a structural lesion-that could have caused her to have seizures/other symptoms, acute CVA.  We will also obtain an EEG.  ED MD has already spoken with neurology-patient will be transferred to [facility] for [facility] evaluation.  Echocardiogram, lipid panel, A1c has also been ordered.  Rehab services has also been ordered.  Patient is allergic to aspirin-hence we will start her on Plavix and high intensity statin.  In case she has had a stroke-we will allow for some permissive hypertension.  We will await further input from neurology.  Hypertension: Hold all antihypertensives-allow permissive hypertension in case she has had a CVA.  Dyslipidemia: We will change from pravastatin to Lipitor.  Await A1c.  Breast cancer: Hold letrozole for now-resume in the next few days.  Mild hypokalemia: Likely secondary to HCTZ-replete and recheck tomorrow morning.  Further plan will depend as patient's clinical course evolves and further radiologic  and laboratory data become available. Patient will be monitored closely.  Above noted plan was discussed with patient/family face to face at bedside, they were in agreement.   CONSULTS: None   DVT Prophylaxis: Prophylactic Lovenox  Code Status: Full Code  Disposition Plan:  Discharge back home vs SNF possibly in 2-3 days, depending on clinical course  Admission status:  Inpatient going to tele  The medical decision making on this patient was of high complexity and the patient is at high risk for clinical deterioration, therefore this is a level 3 visit.   Total time spent  55 minutes.Greater than 50% of  this time was spent in counseling, explanation of diagnosis, planning of further management, and coordination of care.  Oren Binet [facility] Pager [phone]  If 7PM-7AM, please contact night-coverage [email or web address] Password [facility] 10/26/2017, 5:44 PM

## 2017-10-26 NOTE — ED Provider Notes (Addendum)
[facility] DEPT Provider Note   CSN: [id number] Arrival date & time: 10/26/17  1246     History   Chief Complaint No chief complaint on file.   HPI Eileen Holland is a 57 y.o. female.  HPI Patient woke with pain in her tongue and unsteadiness. states she feels somewhat as if she is drunk.  Has a headache and took Her acetaminophen butalbital tablets to help with it.  Continued pain.  States she then went to work at the job has been doing for 19 years.  States that she had difficulty doing the job and trouble remembering how things worked.  Also some confusion.  Difficulty remembering things that happened yesterday.  She had pain to the right side of her tongue with 2 holes in it.  May have bitten her tongue last night.  No history of seizures.  Does have previous history of breast cancer. Past Medical History:  Diagnosis Date  . Allergy   . Anxiety    panic attacks per PCP history  . Breast calcification, right   . Cancer ([place])    right BR CA  with lumpectomy with radiation, no chemo - radiation in 2016  . Constipation    chronic- use stool softener and OTC fiber and prn miralax   . Depression   . Headache    hx of per PCP history  . Headache, migraine 07/04/2014   Last Assessment & Plan:  Refilled butalbital for rare use, once monthly as cannot take NSAIDs.  Advised if needs it more regularly, to follow-up and discuss management furtehr   . High cholesterol   . Hyperlipidemia   . Hypertension   . Personal history of radiation therapy 2016  . Radiation 07/13/15-08/03/15   right breast 4256 cGy    Patient Active Problem List   Diagnosis Date Noted  . Genetic testing 07/13/2015  . Family history of breast cancer in sister 06/30/2015  . Primary cancer of upper outer quadrant of right female breast ([place]) 06/23/2015  . 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 Allergic rhinitis 03/20/2015  . Colon polyp 03/20/2015  . Adenomatous colon polyp  s/p robotic right proximal colectomy 07/17/2014 07/17/2014  . Headache, migraine 07/04/2014  . HTN (hypertension) 06/04/2013  . HLD (hyperlipidemia) 06/04/2013  . Current tobacco use 06/04/2013  . Depression, major, in remission ([place]) 06/04/2013  . Major depression in remission ([place]) 06/04/2013  . BP (high blood pressure) 06/04/2013    Past Surgical History:  Procedure Laterality Date  . BREAST BIOPSY Right 04/28/2015  . BREAST LUMPECTOMY Right 06/09/2015  . BREAST LUMPECTOMY WITH RADIOACTIVE SEED LOCALIZATION Right 06/09/2015   Procedure: RIGHT BREAST LUMPECTOMY WITH RADIOACTIVE SEED LOCALIZATION;  Surgeon: Erroll Luna, MD;  Location: [facility];  Service: General;  Laterality: Right;  . COLON SURGERY  07/17/2014  . COLONOSCOPY    . colonscopy     . KNEE ARTHROSCOPY Right 2000  . POLYPECTOMY  07/17/2014   polyp removed surgicallty   . VAGINAL HYSTERECTOMY  2012    OB History    No data available       Home Medications    Prior to Admission medications   Medication Sig Start Date End Date Taking? Authorizing Provider  ACETAMINOPHEN-BUTALBITAL 50-325 MG TABS Take 1 tablet by mouth every 4 (four) hours as needed (headache.).   Yes [provider]  amLODipine (NORVASC) 10 MG tablet Take 10 mg by mouth every evening. @_0 :30 pm   Yes [provider]  B Complex Vitamins (B COMPLEX PO) Take 1 tablet by mouth daily at 12 noon. (B complex-C-E-zinc)   Yes [provider]  Cholecalciferol (VITAMIN D HIGH POTENCY) 1000 units capsule Take 1,000 Units daily by mouth.    Yes [provider]  docusate sodium (COLACE) 100 MG capsule Take 100 mg by mouth 2 (two) times daily.   Yes [provider]  hydrochlorothiazide 25 MG tablet Take 25 mg by mouth every morning.    Yes [provider]  ketoconazole (NIZORAL) 2 % cream Apply 1 application topically daily as needed for irritation.   Yes [provider]  letrozole  (FEMARA) 2.5 MG tablet TAKE 1 TABLET BY MOUTH EVERY DAY 09/21/17  Yes [place], Holli Humbles, NP  levocetirizine (XYZAL) 5 MG tablet Take 5 mg by mouth daily at 12 noon. For allergies    Yes [provider]  losartan (COZAAR) 100 MG tablet Take 100 mg by mouth every morning.   Yes [provider]  metoprolol succinate (TOPROL-XL) 100 MG 24 hr tablet Take 100 mg by mouth every morning. Take with or immediately following a meal.   Yes [provider]  PARoxetine (PAXIL) 20 MG tablet Take 20 mg by mouth every morning.     Yes [provider]  pravastatin (PRAVACHOL) 20 MG tablet Take 20 mg by mouth daily.  03/26/17  Yes [provider]  Prenatal Vit-Fe Fumarate-FA (PRENATAL PO) Take 1 tablet by mouth daily at 12 noon.   Yes [provider]    Family History Family History  Problem Relation Age of Onset  . Lung cancer Father 45       former smoker  . Colon polyps Father        unspecified number  . Breast cancer Sister 75       negative BRCA testing in 2007  . Stomach cancer Maternal Uncle        dx. 50s-early 42s  . Heart attack Paternal Uncle   . Colon cancer Neg Hx   . Esophageal cancer Neg Hx   . Rectal cancer Neg Hx     Social History Social History   Tobacco Use  . Smoking status: Current Every Day Smoker    Packs/day: 0.50    Years: 32.00    Pack years: 16.00    Types: Cigarettes  . Smokeless tobacco: Never Used  Substance Use Topics  . Alcohol use: Yes    Alcohol/week: 0.0 oz    Comment: occas - maybe 1 drink every 3 mos  . Drug use: No     Allergies   Aspirin and Ibuprofen   Review of Systems Review of Systems  Constitutional: Negative for appetite change, fatigue and fever.  HENT: Negative for congestion.   Respiratory: Negative for shortness of breath.   Gastrointestinal: Negative for abdominal pain.  Genitourinary: Negative for flank pain.  Musculoskeletal: Positive for gait problem.  Neurological:  Positive for speech difficulty and headaches.  Hematological: Negative for adenopathy. Does not bruise/bleed easily.  Psychiatric/Behavioral: Positive for confusion.     Physical Exam Updated Vital Signs BP (!) 152/93 (BP Location: Left Arm)   Pulse (!) 58   Temp 98.5 F (36.9 C) (Oral)   Resp 16   Ht 5' 2.5" (1.588 m)   Wt 69.4 kg (153 lb)   LMP 03/14/2011   SpO2 98%   BMI 27.54 kg/m   Physical Exam  Constitutional: She appears well-developed.  HENT:  Head: Normocephalic.  Two posssible bite marks to right side of tongue   Eyes: Pupils are equal, round, and reactive to light.  Neck: Neck supple.  Cardiovascular: Normal rate.  Abdominal: Soft.  Musculoskeletal: She exhibits no edema.  Neurological: She is alert.  Eye movements intact.  No nystagmus.  Face symmetric.  Possible bite mark to right tongue.  Good grip strength bilaterally.Finger-nose slightly off on left side.  Heel shin intact bilaterally.  Slightly unsteady with Romberg and staggered a little with backwards walking.  But normal gait forwards.   Skin: Skin is warm. Capillary refill takes less than 2 seconds.  Psychiatric: She has a normal mood and affect.     ED Treatments / Results  Labs (all labs ordered are listed, but only abnormal results are displayed) Labs Reviewed  CBC - Abnormal; Notable for the following components:      Result Value   WBC 13.6 (*)    RBC 3.63 (*)    MCV 103.0 (*)    MCH 36.1 (*)    All other components within normal limits  DIFFERENTIAL - Abnormal; Notable for the following components:   Neutro Abs 11.1 (*)    All other components within normal limits  COMPREHENSIVE METABOLIC PANEL - Abnormal; Notable for the following components:   Potassium 3.2 (*)    Chloride 99 (*)    All other components within normal limits  RAPID URINE DRUG SCREEN, HOSP PERFORMED - Abnormal; Notable for the following components:   Barbiturates POSITIVE (*)    All other components within normal  limits  URINALYSIS, ROUTINE W REFLEX MICROSCOPIC - Abnormal; Notable for the following components:   Specific Gravity, Urine 1.004 (*)    Hgb urine dipstick MODERATE (*)    Leukocytes, UA TRACE (*)    Squamous Epithelial / LPF 0-5 (*)    All other components within normal limits  I-STAT CHEM 8, ED - Abnormal; Notable for the following components:   Potassium 3.2 (*)    Chloride 99 (*)    All other components within normal limits  ETHANOL  PROTIME-INR  APTT  I-STAT TROPONIN, ED    EKG  EKG Interpretation  Date/Time:  Thursday October 26 2017 13:44:13 EDT Ventricular Rate:  59 PR Interval:    QRS Duration: 96 QT Interval:  431 QTC Calculation: 427 R Axis:   46 Text Interpretation:  Sinus rhythm Left atrial enlargement Borderline T wave abnormalities Confirmed by Davonna Belling [phone]) on 10/26/2017 3:39:05 PM       Radiology Ct Angio Head W Or Wo Contrast  Result Date: 10/26/2017 CLINICAL DATA:  Awoke with pain in the tongue and gait disturbance. Slurred speech. Expressive aphasia. EXAM: CT ANGIOGRAPHY HEAD AND NECK TECHNIQUE: Multidetector CT imaging of the head and neck was performed using the standard protocol during bolus administration of intravenous contrast. Multiplanar CT image reconstructions and MIPs were obtained to evaluate the vascular anatomy. Carotid stenosis measurements (when applicable) are obtained utilizing NASCET criteria, using the distal internal carotid diameter as the denominator. CONTRAST:  165m ISOVUE-370 IOPAMIDOL (ISOVUE-370) INJECTION 76% COMPARISON:  None. FINDINGS: CT HEAD FINDINGS Brain: The brain shows a normal appearance without evidence of malformation, atrophy, old or acute small or large vessel infarction, mass lesion, hemorrhage, hydrocephalus or extra-axial collection. Vascular: No hyperdense vessel. No evidence of atherosclerotic calcification. Skull: Normal.  No traumatic finding.  No focal bone lesion. Sinuses/Orbits: Sinuses are clear.  Orbits appear normal. Mastoids are clear. Other: None significant CTA NECK FINDINGS Aortic arch: Negative Right  carotid system: Common carotid artery widely patent to the bifurcation region. Carotid bifurcation normal without soft or calcified plaque. Cervical ICA is tortuous but normal. Left carotid system: Left common carotid artery widely patent to the bifurcation region. Carotid bifurcation is normal without soft or calcified plaque. Cervical ICA is tortuous but widely patent. Vertebral arteries: Both vertebral artery origins are widely patent. The vertebral arteries are approximately equal in size and widely patent through the cervical region to the foramen magnum. The vessels are quite tortuous. Skeleton: Ordinary mid cervical spondylosis. Other neck: No mass or lymphadenopathy. Upper chest: Negative Review of the MIP images confirms the above findings CTA HEAD FINDINGS Anterior circulation: Both internal carotid arteries are patent through the skull base and siphon regions. Minimal atherosclerotic calcification in the carotid siphons but no stenosis. The anterior and middle cerebral vessels are patent without proximal stenosis, aneurysm or vascular malformation. No missing branch vessels identified. Fetal origin of both posterior cerebral arteries. Posterior circulation: Both vertebral arteries are patent through the foramen magnum to the basilar. The vertebrobasilar system is small, because of primary fetal origin of the posterior cerebral arteries. No posterior circulation branch vessel abnormality is seen. Venous sinuses: Patent and normal. Anatomic variants: None other. Delayed phase: Abnormal enhancement. Review of the MIP images confirms the above findings IMPRESSION: Normal appearance of the brain. No visible stroke. No sign of metastatic disease. Negative CT angiography. No evidence of atherosclerotic disease. No large or medium vessel occlusion. Electronically Signed   By: Nelson Chimes M.D.   On:  10/26/2017 15:41   Dg Chest 2 View  Result Date: 10/26/2017 CLINICAL DATA:  Possible seizure. EXAM: CHEST - 2 VIEW COMPARISON:  09/07/2015 FINDINGS: The cardiac silhouette is mildly enlarged. The lungs are normally to mildly hyperinflated. No airspace consolidation, edema, pleural effusion, pneumothorax is identified. No acute osseous abnormality is seen. IMPRESSION: No active cardiopulmonary disease. Electronically Signed   By: Logan Bores M.D.   On: 10/26/2017 15:03   Ct Angio Neck W And/or Wo Contrast  Result Date: 10/26/2017 CLINICAL DATA:  Awoke with pain in the tongue and gait disturbance. Slurred speech. Expressive aphasia. EXAM: CT ANGIOGRAPHY HEAD AND NECK TECHNIQUE: Multidetector CT imaging of the head and neck was performed using the standard protocol during bolus administration of intravenous contrast. Multiplanar CT image reconstructions and MIPs were obtained to evaluate the vascular anatomy. Carotid stenosis measurements (when applicable) are obtained utilizing NASCET criteria, using the distal internal carotid diameter as the denominator. CONTRAST:  152m ISOVUE-370 IOPAMIDOL (ISOVUE-370) INJECTION 76% COMPARISON:  None. FINDINGS: CT HEAD FINDINGS Brain: The brain shows a normal appearance without evidence of malformation, atrophy, old or acute small or large vessel infarction, mass lesion, hemorrhage, hydrocephalus or extra-axial collection. Vascular: No hyperdense vessel. No evidence of atherosclerotic calcification. Skull: Normal.  No traumatic finding.  No focal bone lesion. Sinuses/Orbits: Sinuses are clear. Orbits appear normal. Mastoids are clear. Other: None significant CTA NECK FINDINGS Aortic arch: Negative Right carotid system: Common carotid artery widely patent to the bifurcation region. Carotid bifurcation normal without soft or calcified plaque. Cervical ICA is tortuous but normal. Left carotid system: Left common carotid artery widely patent to the bifurcation region. Carotid  bifurcation is normal without soft or calcified plaque. Cervical ICA is tortuous but widely patent. Vertebral arteries: Both vertebral artery origins are widely patent. The vertebral arteries are approximately equal in size and widely patent through the cervical region to the foramen magnum. The vessels are quite tortuous. Skeleton: Ordinary mid cervical  spondylosis. Other neck: No mass or lymphadenopathy. Upper chest: Negative Review of the MIP images confirms the above findings CTA HEAD FINDINGS Anterior circulation: Both internal carotid arteries are patent through the skull base and siphon regions. Minimal atherosclerotic calcification in the carotid siphons but no stenosis. The anterior and middle cerebral vessels are patent without proximal stenosis, aneurysm or vascular malformation. No missing branch vessels identified. Fetal origin of both posterior cerebral arteries. Posterior circulation: Both vertebral arteries are patent through the foramen magnum to the basilar. The vertebrobasilar system is small, because of primary fetal origin of the posterior cerebral arteries. No posterior circulation branch vessel abnormality is seen. Venous sinuses: Patent and normal. Anatomic variants: None other. Delayed phase: Abnormal enhancement. Review of the MIP images confirms the above findings IMPRESSION: Normal appearance of the brain. No visible stroke. No sign of metastatic disease. Negative CT angiography. No evidence of atherosclerotic disease. No large or medium vessel occlusion. Electronically Signed   By: Nelson Chimes M.D.   On: 10/26/2017 15:41    Procedures Procedures (including critical care time)  Medications Ordered in ED Medications  sodium chloride 0.9 % injection (not administered)  iopamidol (ISOVUE-370) 76 % injection (not administered)  iopamidol (ISOVUE-370) 76 % injection 100 mL (100 mLs Intravenous Contrast Given 10/26/17 1510)     Initial Impression / Assessment and Plan / ED Course    I have reviewed the triage vital signs and the nursing notes.  Pertinent labs & imaging results that were available during my care of the patient were reviewed by me and considered in my medical decision making (see chart for details).    Patient came in after waking up with slurred speech some confusion and difficulty walking.  Not a TPA candidate due to time of onset.  Also may have had seizure with the bite marks on the tongue.  Head CT done and reassuring.  Still has slight finger-nose abnormality on left side and still some memory issues.  Discussed with hospitalist, who admit patient and recommends discussion with neurology.  Discussed with Dr. Cheral Marker, who will see the patient in consult once she gets to [facility].   Final Clinical Impressions(s) / ED Diagnoses   Final diagnoses:  Confusion  Unsteadiness    ED Discharge Orders    None       Davonna Belling, MD 10/26/17 [place], Krimson Massmann, MD 10/26/17 1627

## 2017-10-26 NOTE — ED Notes (Addendum)
ED TO INPATIENT HANDOFF REPORT  Name/Age/Gender Eileen Holland 57 y.o. female  Code Status Code Status History    Date Active Date Inactive Code Status Order ID Comments User Context   07/17/2014 18:35 07/21/2014 16:05 Full Code [id number]  Michael Boston, MD Inpatient   03/29/2011 12:14 03/29/2011 21:30 Full Code [id number]  Lavonna Rua, RN Inpatient      Home/SNF/Other Home  Chief Complaint Off-Balance; Confusion; Teeth Grinding  Level of Care/Admitting Diagnosis ED Disposition    ED Disposition Condition Comment   Admit  Hospital Area: [facility] [[id number]]  Level of Care: Telemetry [5]  Diagnosis: Acute CVA (cerebrovascular accident) [facility]) [[id number]]  Admitting Physician: Jonetta Osgood [[id number]]  Attending Physician: Jonetta Osgood [[id number]]  Estimated length of stay: past midnight tomorrow  Certification:: I certify this patient will need inpatient services for at least 2 midnights  PT Class (Do Not Modify): Inpatient [101]  PT Acc Code (Do Not Modify): Private [1]       Medical History Past Medical History:  Diagnosis Date  . Allergy   . Anxiety    panic attacks per PCP history  . Breast calcification, right   . 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 Constipation    chronic- use stool softener and OTC fiber and prn miralax   . Depression   . Headache    hx of per PCP history  . Headache, migraine 07/04/2014   Last Assessment & Plan:  Refilled butalbital for rare use, once monthly as cannot take NSAIDs.  Advised if needs it more regularly, to follow-up and discuss management furtehr   . High cholesterol   . Hyperlipidemia   . Hypertension   . Personal history of radiation therapy 2016  . Radiation 07/13/15-08/03/15   right breast 4256 cGy    Allergies Allergies  Allergen Reactions  . Aspirin Hives  . Ibuprofen Hives    IV Location/Drains/Wounds Patient Lines/Drains/Airways  Status   Active Line/Drains/Airways    Name:   Placement date:   Placement time:   Site:   Days:   Peripheral IV 10/26/17 Right Antecubital   10/26/17    1426    Antecubital   less than 1   Airway 4 mm   06/09/15    1312     870   Incision (Closed) 07/17/14 Abdomen   07/17/14    1556     1197   Incision (Closed) 06/09/15 Breast Right   06/09/15    1312     870   Incision - 3 Ports Abdomen Right;Lower Umbilicus Left;Upper   07/37/10    1330     1197          Labs/Imaging Results for orders placed or performed during the hospital encounter of 10/26/17 (from the past 48 hour(s))  Ethanol     Status: None   Collection Time: 10/26/17  2:25 PM  Result Value Ref Range   Alcohol, Ethyl (B) <10 <10 mg/dL    Comment:        LOWEST DETECTABLE LIMIT FOR SERUM ALCOHOL IS 10 mg/dL FOR MEDICAL PURPOSES ONLY Performed at [facility] [address], [place], [place] [address]   Protime-INR     Status: None   Collection Time: 10/26/17  2:25 PM  Result Value Ref Range   Prothrombin Time 13.1 11.4 - 15.2 seconds   INR 1.00  Comment: Performed at [facility], [place] [address] [place], [place] [address]  APTT     Status: None   Collection Time: 10/26/17  2:25 PM  Result Value Ref Range   aPTT 31 24 - 36 seconds    Comment: Performed at [facility], [place] [address] [place], [place] [address]  CBC     Status: Abnormal   Collection Time: 10/26/17  2:25 PM  Result Value Ref Range   WBC 13.6 (H) 4.0 - 10.5 K/uL   RBC 3.63 (L) 3.87 - 5.11 MIL/uL   Hemoglobin 13.1 12.0 - 15.0 g/dL   HCT 37.4 36.0 - 46.0 %   MCV 103.0 (H) 78.0 - 100.0 fL   MCH 36.1 (H) 26.0 - 34.0 pg   MCHC 35.0 30.0 - 36.0 g/dL   RDW 11.9 11.5 - 15.5 %   Platelets 239 150 - 400 K/uL    Comment: Performed at [facility], [place] [address], [place], [place] [address]  Differential     Status: Abnormal   Collection Time: 10/26/17  2:25 PM  Result Value  Ref Range   Neutrophils Relative % 82 %   Neutro Abs 11.1 (H) 1.7 - 7.7 K/uL   Lymphocytes Relative 13 %   Lymphs Abs 1.7 0.7 - 4.0 K/uL   Monocytes Relative 5 %   Monocytes Absolute 0.7 0.1 - 1.0 K/uL   Eosinophils Relative 0 %   Eosinophils Absolute 0.0 0.0 - 0.7 K/uL   Basophils Relative 0 %   Basophils Absolute 0.0 0.0 - 0.1 K/uL    Comment: Performed at [facility], [place] [address] [place], [place] [address]  Comprehensive metabolic panel     Status: Abnormal   Collection Time: 10/26/17  2:25 PM  Result Value Ref Range   Sodium 137 135 - 145 mmol/L   Potassium 3.2 (L) 3.5 - 5.1 mmol/L   Chloride 99 (L) 101 - 111 mmol/L   CO2 28 22 - 32 mmol/L   Glucose, Bld 90 65 - 99 mg/dL   BUN 8 6 - 20 mg/dL   Creatinine, Ser 0.79 0.44 - 1.00 mg/dL   Calcium 9.8 8.9 - 10.3 mg/dL   Total Protein 8.1 6.5 - 8.1 g/dL   Albumin 4.3 3.5 - 5.0 g/dL   AST 37 15 - 41 U/L   ALT 22 14 - 54 U/L   Alkaline Phosphatase 67 38 - 126 U/L   Total Bilirubin 0.7 0.3 - 1.2 mg/dL   GFR calc non Af Amer >60 >60 mL/min   GFR calc Af Amer >60 >60 mL/min    Comment: (NOTE) The eGFR has been calculated using the CKD EPI equation. This calculation has not been validated in all clinical situations. eGFR's persistently <60 mL/min signify possible Chronic Kidney Disease.    Anion gap 10 5 - 15    Comment: Performed at [facility], [place] [address], [place], [place] [address]  I-stat troponin, ED     Status: None   Collection Time: 10/26/17  2:31 PM  Result Value Ref Range   Troponin i, poc 0.00 0.00 - 0.08 ng/mL   Comment 3            Comment: Due to the release kinetics of cTnI, a negative result within the first hours of the onset of symptoms does not rule out myocardial infarction with certainty. If myocardial infarction is still suspected, repeat the test at appropriate intervals.  I-Stat Chem 8, ED     Status: Abnormal   Collection Time: 10/26/17  2:32 PM   Result Value Ref Range   Sodium 138 135 - 145 mmol/L   Potassium 3.2 (L) 3.5 - 5.1 mmol/L   Chloride 99 (L) 101 - 111 mmol/L   BUN 7 6 - 20 mg/dL   Creatinine, Ser 0.80 0.44 - 1.00 mg/dL   Glucose, Bld 85 65 - 99 mg/dL   Calcium, Ion 1.16 1.15 - 1.40 mmol/L   TCO2 28 22 - 32 mmol/L   Hemoglobin 13.6 12.0 - 15.0 g/dL   HCT 40.0 36.0 - 46.0 %  Urine rapid drug screen (hosp performed)     Status: Abnormal   Collection Time: 10/26/17  3:05 PM  Result Value Ref Range   Opiates NONE DETECTED NONE DETECTED   Cocaine NONE DETECTED NONE DETECTED   Benzodiazepines NONE DETECTED NONE DETECTED   Amphetamines NONE DETECTED NONE DETECTED   Tetrahydrocannabinol NONE DETECTED NONE DETECTED   Barbiturates POSITIVE (A) NONE DETECTED    Comment: (NOTE) DRUG SCREEN FOR MEDICAL PURPOSES ONLY.  IF CONFIRMATION IS NEEDED FOR ANY PURPOSE, NOTIFY LAB WITHIN 5 DAYS. LOWEST DETECTABLE LIMITS FOR URINE DRUG SCREEN Drug Class                     Cutoff (ng/mL) Amphetamine and metabolites    1000 Barbiturate and metabolites    200 Benzodiazepine                 189 Tricyclics and metabolites     300 Opiates and metabolites        300 Cocaine and metabolites        300 THC                            50 Performed at [facility], [place] [address] [place], [place] [address]   Urinalysis, Routine w reflex microscopic     Status: Abnormal   Collection Time: 10/26/17  3:05 PM  Result Value Ref Range   Color, Urine YELLOW YELLOW   APPearance CLEAR CLEAR   Specific Gravity, Urine 1.004 (L) 1.005 - 1.030   pH 7.0 5.0 - 8.0   Glucose, UA NEGATIVE NEGATIVE mg/dL   Hgb urine dipstick MODERATE (A) NEGATIVE   Bilirubin Urine NEGATIVE NEGATIVE   Ketones, ur NEGATIVE NEGATIVE mg/dL   Protein, ur NEGATIVE NEGATIVE mg/dL   Nitrite NEGATIVE NEGATIVE   Leukocytes, UA TRACE (A) NEGATIVE   RBC / HPF 6-30 0 - 5 RBC/hpf   WBC, UA 0-5 0 - 5 WBC/hpf   Bacteria, UA NONE SEEN NONE SEEN   Squamous  Epithelial / LPF 0-5 (A) NONE SEEN    Comment: Performed at [facility], [place] [address], [place], [place] [address]   Ct Angio Head W Or Wo Contrast  Result Date: 10/26/2017 CLINICAL DATA:  Awoke with pain in the tongue and gait disturbance. Slurred speech. Expressive aphasia. EXAM: CT ANGIOGRAPHY HEAD AND NECK TECHNIQUE: Multidetector CT imaging of the head and neck was performed using the standard protocol during bolus administration of intravenous contrast. Multiplanar CT image reconstructions and MIPs were obtained to evaluate the vascular anatomy. Carotid stenosis measurements (when applicable) are obtained utilizing NASCET criteria, using the distal internal carotid diameter as the denominator. CONTRAST:  181m ISOVUE-370 IOPAMIDOL (ISOVUE-370) INJECTION 76% COMPARISON:  None. FINDINGS: CT HEAD FINDINGS Brain: The brain shows a  normal appearance without evidence of malformation, atrophy, old or acute small or large vessel infarction, mass lesion, hemorrhage, hydrocephalus or extra-axial collection. Vascular: No hyperdense vessel. No evidence of atherosclerotic calcification. Skull: Normal.  No traumatic finding.  No focal bone lesion. Sinuses/Orbits: Sinuses are clear. Orbits appear normal. Mastoids are clear. Other: None significant CTA NECK FINDINGS Aortic arch: Negative Right carotid system: Common carotid artery widely patent to the bifurcation region. Carotid bifurcation normal without soft or calcified plaque. Cervical ICA is tortuous but normal. Left carotid system: Left common carotid artery widely patent to the bifurcation region. Carotid bifurcation is normal without soft or calcified plaque. Cervical ICA is tortuous but widely patent. Vertebral arteries: Both vertebral artery origins are widely patent. The vertebral arteries are approximately equal in size and widely patent through the cervical region to the foramen magnum. The vessels are quite tortuous. Skeleton: Ordinary  mid cervical spondylosis. Other neck: No mass or lymphadenopathy. Upper chest: Negative Review of the MIP images confirms the above findings CTA HEAD FINDINGS Anterior circulation: Both internal carotid arteries are patent through the skull base and siphon regions. Minimal atherosclerotic calcification in the carotid siphons but no stenosis. The anterior and middle cerebral vessels are patent without proximal stenosis, aneurysm or vascular malformation. No missing branch vessels identified. Fetal origin of both posterior cerebral arteries. Posterior circulation: Both vertebral arteries are patent through the foramen magnum to the basilar. The vertebrobasilar system is small, because of primary fetal origin of the posterior cerebral arteries. No posterior circulation branch vessel abnormality is seen. Venous sinuses: Patent and normal. Anatomic variants: None other. Delayed phase: Abnormal enhancement. Review of the MIP images confirms the above findings IMPRESSION: Normal appearance of the brain. No visible stroke. No sign of metastatic disease. Negative CT angiography. No evidence of atherosclerotic disease. No large or medium vessel occlusion. Electronically Signed   By: Nelson Chimes M.D.   On: 10/26/2017 15:41   Dg Chest 2 View  Result Date: 10/26/2017 CLINICAL DATA:  Possible seizure. EXAM: CHEST - 2 VIEW COMPARISON:  09/07/2015 FINDINGS: The cardiac silhouette is mildly enlarged. The lungs are normally to mildly hyperinflated. No airspace consolidation, edema, pleural effusion, pneumothorax is identified. No acute osseous abnormality is seen. IMPRESSION: No active cardiopulmonary disease. Electronically Signed   By: Logan Bores M.D.   On: 10/26/2017 15:03   Ct Angio Neck W And/or Wo Contrast  Result Date: 10/26/2017 CLINICAL DATA:  Awoke with pain in the tongue and gait disturbance. Slurred speech. Expressive aphasia. EXAM: CT ANGIOGRAPHY HEAD AND NECK TECHNIQUE: Multidetector CT imaging of the head  and neck was performed using the standard protocol during bolus administration of intravenous contrast. Multiplanar CT image reconstructions and MIPs were obtained to evaluate the vascular anatomy. Carotid stenosis measurements (when applicable) are obtained utilizing NASCET criteria, using the distal internal carotid diameter as the denominator. CONTRAST:  157m ISOVUE-370 IOPAMIDOL (ISOVUE-370) INJECTION 76% COMPARISON:  None. FINDINGS: CT HEAD FINDINGS Brain: The brain shows a normal appearance without evidence of malformation, atrophy, old or acute small or large vessel infarction, mass lesion, hemorrhage, hydrocephalus or extra-axial collection. Vascular: No hyperdense vessel. No evidence of atherosclerotic calcification. Skull: Normal.  No traumatic finding.  No focal bone lesion. Sinuses/Orbits: Sinuses are clear. Orbits appear normal. Mastoids are clear. Other: None significant CTA NECK FINDINGS Aortic arch: Negative Right carotid system: Common carotid artery widely patent to the bifurcation region. Carotid bifurcation normal without soft or calcified plaque. Cervical ICA is tortuous but normal. Left carotid system: Left  common carotid artery widely patent to the bifurcation region. Carotid bifurcation is normal without soft or calcified plaque. Cervical ICA is tortuous but widely patent. Vertebral arteries: Both vertebral artery origins are widely patent. The vertebral arteries are approximately equal in size and widely patent through the cervical region to the foramen magnum. The vessels are quite tortuous. Skeleton: Ordinary mid cervical spondylosis. Other neck: No mass or lymphadenopathy. Upper chest: Negative Review of the MIP images confirms the above findings CTA HEAD FINDINGS Anterior circulation: Both internal carotid arteries are patent through the skull base and siphon regions. Minimal atherosclerotic calcification in the carotid siphons but no stenosis. The anterior and middle cerebral vessels  are patent without proximal stenosis, aneurysm or vascular malformation. No missing branch vessels identified. Fetal origin of both posterior cerebral arteries. Posterior circulation: Both vertebral arteries are patent through the foramen magnum to the basilar. The vertebrobasilar system is small, because of primary fetal origin of the posterior cerebral arteries. No posterior circulation branch vessel abnormality is seen. Venous sinuses: Patent and normal. Anatomic variants: None other. Delayed phase: Abnormal enhancement. Review of the MIP images confirms the above findings IMPRESSION: Normal appearance of the brain. No visible stroke. No sign of metastatic disease. Negative CT angiography. No evidence of atherosclerotic disease. No large or medium vessel occlusion. Electronically Signed   By: Nelson Chimes M.D.   On: 10/26/2017 15:41    Pending Labs [employer] (From admission, onward)   Start     Ordered   Signed and Held  HIV antibody (Routine Testing)  Once,   R     Signed and Held   Signed and Held  Hemoglobin A1c  Tomorrow morning,   R     Signed and Held   Signed and Held  Lipid panel  Tomorrow morning,   R    Comments:  Fasting    Signed and Held   Signed and Held  CBC  (enoxaparin (LOVENOX)    CrCl >/= 30 ml/min)  Once,   R    Comments:  Baseline for enoxaparin therapy IF NOT ALREADY DRAWN.  Notify MD if PLT < 100 K.    Signed and Held   Signed and Held  Creatinine, serum  (enoxaparin (LOVENOX)    CrCl >/= 30 ml/min)  Once,   R    Comments:  Baseline for enoxaparin therapy IF NOT ALREADY DRAWN.    Signed and Held   Signed and Held  Creatinine, serum  (enoxaparin (LOVENOX)    CrCl >/= 30 ml/min)  Weekly,   R    Comments:  while on enoxaparin therapy    Signed and Held      Vitals/Pain Today's Vitals   10/26/17 1344 10/26/17 1345  BP: (!) 152/93   Pulse: (!) 58   Resp: 16   Temp: 98.5 F (36.9 C)   TempSrc: Oral   SpO2: 98%   Weight:  153 lb (69.4 kg)  Height:  5'  2.5" (1.588 m)  PainSc: 7      Isolation Precautions No active isolations  Medications Medications  sodium chloride 0.9 % injection (not administered)  iopamidol (ISOVUE-370) 76 % injection (not administered)  iopamidol (ISOVUE-370) 76 % injection 100 mL (100 mLs Intravenous Contrast Given 10/26/17 1510)    Mobility walkes

## 2017-10-26 NOTE — ED Notes (Signed)
[facility]

## 2017-10-26 NOTE — ED Triage Notes (Signed)
Patient states she woke up with tongue pain, could not walk straight and falling to the side, slurred speech. Patient went to work, symptoms worsened. Patient continues to have slurred speech, expressive aphasia. Co-workers called the patient's daughter and stated that the patient could not remember how to do her job.

## 2017-10-26 NOTE — ED Notes (Signed)
[facility] en route.  °

## 2017-10-26 NOTE — ED Notes (Signed)
Patient is to get MRI at [facility]. Admitting doctor put in another MRI to be done at [facility]. Eileen Holland long and  MRI was notified.

## 2017-10-26 NOTE — Progress Notes (Signed)
Pt gave permission for her daughter and sister to remain in the room while nursing admission history questions were asked. Lucius Conn BSN, RN-BC Admissions RN 10/26/2017 4:52 PM

## 2017-10-26 NOTE — ED Notes (Signed)
Bed: WA16 Expected date:  Expected time:  Means of arrival:  Comments: Hold for triage 

## 2017-10-26 NOTE — Consult Note (Addendum)
Referring Physician: Jonetta Osgood, MD    Chief Complaint: Confusion, some expressive aphasia, unsteady gait, tongue bite-sometime last night  HPI: Eileen Holland is an 57 y.o. female with a past medical history of breast cancer on Femara, hypertension, dyslipidemia migraines on Fioricet who presents the ED for evaluation of tongue bite overnight, unsteady gait, confusion with expressive aphasia which she noticed upon waking up this morning.  Per patient around 5 AM this morning when she woke up she noted her tongue was very sore to large bite marks, noted that she had bit her tongue overnight -something that has never occurred before.  When she did try to get up she realized she was lightheaded with unsteady gait, hitting the wall and objects in her way as she walked to the bathroom, associated with it was a frontal headache without photophobia, headache-all symptoms which continues to persist. She took fioricet in am with minimal relief. 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  She denies any motor deficits or inability to hold objects in her hand, denies abdominal pain, vomititng,  chest pain, shortness of breath, vomiting, blurred vision or recent falls or trauma.  Admits to recovering from a cold last week. Coworkers called her daughter who picked her up and brought her to the ER.  Per records her daughter realize patient was somewhat confused and also unable to express herself with unsteady gait. On admission to the ER noncontrast CT of the head showed normal appearance of the brain, no visible signs of stroke or metastatic disease.  CTA of the head and neck was negative without evidence of atherosclerotic disease or large vessel or medium vessel occlusion.. Neuro was consulted for further  evaluation.  On assessment patient is awake alert oriented x3 in no acute distress, she continues to have mild dysarthria due to tongue pain, states tongue feels swollen.  She has no focal motor deficits are mental deficits.  LSN: 10/25/17 at bedtime tPA Given: No: No LVO  Premorbid modified Rankin scale (mRS): 0 NIHSS: 1   Past Medical History:  Diagnosis Date  . Allergy   . Anxiety    panic attacks per PCP history  . Breast calcification, right   . Cancer ([place])    right BR CA  with lumpectomy with radiation, no chemo - radiation in 2016  . Constipation    chronic- use stool softener and OTC fiber and prn miralax   . Depression   . Headache    hx of per PCP history  . Headache, migraine 07/04/2014   Last Assessment & Plan:  Refilled butalbital for rare use, once monthly as cannot take NSAIDs.  Advised if needs it more regularly, to follow-up and discuss management furtehr   . High cholesterol   . Hyperlipidemia   . Hypertension   . Personal history of radiation therapy 2016  . Radiation 07/13/15-08/03/15   right breast 4256 cGy    Past Surgical History:  Procedure Laterality Date  . BREAST BIOPSY Right 04/28/2015  . BREAST LUMPECTOMY Right 06/09/2015  . BREAST LUMPECTOMY WITH RADIOACTIVE SEED LOCALIZATION Right 06/09/2015   Procedure: RIGHT BREAST LUMPECTOMY WITH RADIOACTIVE SEED LOCALIZATION;  Surgeon: Erroll Luna, MD;  Location: [facility];  Service: General;  Laterality: Right;  . COLON  SURGERY  07/17/2014  . COLONOSCOPY    . colonscopy     . KNEE ARTHROSCOPY Right 2000  . POLYPECTOMY  07/17/2014   polyp removed surgicallty   . VAGINAL HYSTERECTOMY  2012    Family History  Problem Relation Age of Onset  . Lung cancer Father 6       former smoker  . Colon polyps Father        unspecified number  . Breast cancer Sister 11       negative BRCA testing in 2007  . Stomach cancer Maternal Uncle        dx. 50s-early 41s  . Heart attack Paternal  Uncle   . Colon cancer Neg Hx   . Esophageal cancer Neg Hx   . Rectal cancer Neg Hx    Social History:  reports that she has been smoking cigarettes.  She has a 16.00 pack-year smoking history. she has never used smokeless tobacco. She reports that she drinks alcohol. She reports that she does not use drugs.  Allergies:  Allergies  Allergen Reactions  . Aspirin Hives  . Ibuprofen Hives    Medications:  .  stroke: mapping our early stages of recovery book   Does not apply Once  . [START ON 10/27/2017] atorvastatin  80 mg Oral q1800  . clopidogrel  75 mg Oral Daily  . iopamidol      . sodium chloride        ROS: 13 point systems reviewed with patient and are negative except for above in HPI.  Physical Examination: Blood pressure 138/76, pulse 63, temperature 98.6 F (37 C), temperature source Oral, resp. rate 20, height 5' 2.5" (1.588 m), weight 69.4 kg (153 lb), last menstrual period 03/14/2011, SpO2 99 %. HEENT-  Normocephalic, no lesions, without obvious abnormality.  Normal external eye and conjunctiva.  Right side of tongue tongue with 2 small bite marks. Cardiovascular- S1-S2 audible, pulses palpable throughout   Lungs-no rhonchi or wheezing noted, no excessive working breathing.  Saturations within normal limits Abdomen- All 4 quadrants palpated and nontender Musculoskeletal-no joint tenderness, deformity or swelling Skin-warm and dry, no hyperpigmentation, vitiligo, or suspicious lesions  Neurological Examination Mental Status: Alert, oriented, thought content appropriate.  Speech fluent without evidence of aphasia, but has mild dysarthria secondary to tongue pain.  Able to follow 3 step commands without difficulty. Cranial Nerves: II: Visual fields grossly normal,  III,IV, VI: ptosis not present, extra-ocular motions intact bilaterally pupils equal, round, reactive to light and accommodation V,VII: smile symmetric, facial light touch sensation normal bilaterally VIII:  Hearing intact to voice IX,X: uvula rises symmetrically XI: bilateral shoulder shrug XII: midline tongue extension Motor: Right : Upper extremity   5/5    Left:     Upper extremity   5/5  Lower extremity   5/5     Lower extremity   5/5 Tone and bulk:normal tone throughout; no atrophy noted Sensory: Pinprick and light touch intact throughout, bilaterally Deep Tendon Reflexes: 2+ and symmetric bilaterally biceps and +4 brisk with internal rotation when either patella tested Plantars: Right: downgoing   Left: downgoing Cerebellar: normal finger-to-nose, normal rapid alternating movements and normal heel-to-shin test Gait: Unsteady per  family, not assessed secondary to safety  NIHSS 1a Level of Conscious.: 0 1b LOC Questions: 0 1c LOC Commands: 0 2 Best Gaze: 0 3 Visual: 0 4 Facial Palsy: 0 5a Motor Arm - left: 0 5b Motor Arm - Right: 0 6a Motor Leg - Left: 0 6b  Motor Leg - Right: 0 7 Limb Ataxia: 0 8 Sensory: 0 9 Best Language: 0 10 Dysarthria: 1 11 Extinct. and Inatten.: 0 TOTAL: 1   Ct Angio Head W Or Wo Contrast 10/26/2017 IMPRESSION:  Normal appearance of the brain. No visible stroke. No sign of metastatic disease. Negative CT angiography. No evidence of atherosclerotic disease. No large or medium vessel occlusion.    Ct Angio Neck W And/or Wo Contrast  10/26/2017 IMPRESSION: Normal appearance of the brain. No visible stroke. No sign of metastatic disease. Negative CT angiography. No evidence of atherosclerotic disease. No large or medium vessel occlusion.    Assessment: 57 y.o. female with a past medical history of breast cancer on Femara, hypertension, dyslipidemia migraines on Fioricet who presents the ED for evaluation of tongue bite overnight, unsteady gait, confusion with expressive aphasia which she noticed upon waking up this morning.  1. Rule out stroke versus seizure.  Patient comes to the ED with presenting symptoms of biting the right side of her tongue and  her sleep, awakened this morning with dysarthria, mild expressive aphasia, ataxic gait and mild confusion.  Symptoms are concerning for either seizure versus a mid brain  stroke.  Patient denies a history of seizures but we will order EEG for further evaluation family at bedside denies patient having any incidence of tonic-clonic jerking or tremors.  Initial CT of the head and CTA of the head and neck were negative.  We will continue to pursue full stroke workup with MRI of the brain, echocardiogram, Plavix ( Pt allergic to aspirin) and high-dose statin for  secondary stroke prevention.  Order early rehabilitation with physical therapy, occupational therapy and speech therapy.  Currently patient's vitals and electrolytes are within normal limits except for mild hypokalemia.  Discussed risk factor modification with patient. Stroke Risk Factors - hypertension, dyslipidemia, tobacco use .  She denies a family hx of stroke.  2. HTN  3. Tobacco Abuse-discussed tobacco cessation with patient  4. Pt with hx of Vertigo 5. Breast cancer -  On Femara  Plan: - EEG - HgbA1c, fasting lipid panel - MRI, of the brain without contrast - PT consult, OT consult, Speech consult - Echocardiogram - Prophylactic therapy-Antiplatelet med: Plavix - dose 51m - High dose Statin Atorvastatin 874m- Risk factor modification - Telemetry monitoring - Frequent neuro checks   PeJacob MooresNP Neuro-hospitalist Team [phone] 1890/14/2019, 6:53 PM  I have seen and examined the patient. Assessment and recommendations discussed with neurology team.  Electronically signed: Dr. ErKerney Elbe

## 2017-10-26 NOTE — ED Notes (Signed)
Patient went to MRI before [facility] took her.

## 2017-10-27 ENCOUNTER — Inpatient Hospital Stay (HOSPITAL_COMMUNITY): Payer: Managed Care, Other (non HMO)

## 2017-10-27 ENCOUNTER — Other Ambulatory Visit (HOSPITAL_COMMUNITY): Payer: Managed Care, Other (non HMO)

## 2017-10-27 DIAGNOSIS — R2681 Unsteadiness on feet: Secondary | ICD-10-CM

## 2017-10-27 DIAGNOSIS — R410 Disorientation, unspecified: Secondary | ICD-10-CM

## 2017-10-27 LAB — LIPID PANEL
Cholesterol: 173 mg/dL (ref 0–200)
HDL: 61 mg/dL (ref >40)
LDL CALC: 86 mg/dL (ref 0–99)
TRIGLYCERIDES: 132 mg/dL (ref <150)
Total CHOL/HDL Ratio: 2.8 RATIO
VLDL: 26 mg/dL (ref 0–40)

## 2017-10-27 LAB — HEMOGLOBIN A1C
HEMOGLOBIN A1C: 4.7 % — AB (ref 4.8–5.6)
Mean Plasma Glucose: 88.19 mg/dL

## 2017-10-27 LAB — HIV ANTIBODY (ROUTINE TESTING W REFLEX): HIV SCREEN 4TH GENERATION: NONREACTIVE

## 2017-10-27 MED ORDER — LEVETIRACETAM 500 MG PO TABS
500.0000 mg | ORAL_TABLET | Freq: Two times a day (BID) | ORAL | Status: DC
  Administered 2017-10-28: 500 mg via ORAL
  Filled 2017-10-27 (×2): qty 1

## 2017-10-27 MED ORDER — LETROZOLE 2.5 MG PO TABS
2.5000 mg | ORAL_TABLET | Freq: Every day | ORAL | Status: DC
  Administered 2017-10-27 – 2017-10-28 (×2): 2.5 mg via ORAL
  Filled 2017-10-27 (×2): qty 1

## 2017-10-27 MED ORDER — LEVETIRACETAM IN NACL 1000 MG/100ML IV SOLN
1000.0000 mg | Freq: Once | INTRAVENOUS | Status: AC
  Administered 2017-10-27: 1000 mg via INTRAVENOUS
  Filled 2017-10-27: qty 100

## 2017-10-27 NOTE — Progress Notes (Signed)
Patient ID: Eileen Holland, female   DOB: 1960-12-21, 57 y.o.   MRN: [id number] Spoke to Dr. Lindzen/neurology regarding patient's EEG.  EEG is suspicious for seizure.  He will order IV Keppra load followed by oral Keppra.  Patient will be monitored overnight. If  Seizure-free, probable discharge in a.m.

## 2017-10-27 NOTE — Evaluation (Signed)
Occupational Therapy Evaluation Patient Details Name: Eileen Holland MRN: [id number] DOB: 1961/06/18 Today's Date: 10/27/2017    History of Present Illness 57 year old female with history of breast cancer on Femara, hypertension, dyslipidemia presented on 10/26/2017 with confusion, some expressive aphasia, unsteady gait and tongue bite.  Neurology was consulted.  Initial CT angiogram of the head and neck was unremarkable.   Clinical Impression   This 57 y/o F presents with the above. At baseline pt is independent with ADLs and functional mobility. Pt completing room level functional mobility, toileting and standing grooming ADLs with supervision throughout and no significant LOB noted. Pt reports feeling mostly back to her baseline regarding ADL and functional mobility completion, though does report pain in bil LEs. Feel pt is safe to return home from OT standpoint once medically ready, anticipate pt will not require follow up OT services. OT referral is appreciated, will sign off. Please re-consult if needs change.     Follow Up Recommendations  No OT follow up;Supervision - Intermittent    Equipment Recommendations  None recommended by OT           Precautions / Restrictions Precautions Precautions: None Restrictions Weight Bearing Restrictions: No      Mobility Bed Mobility Overal bed mobility: Modified Independent                Transfers Overall transfer level: Needs assistance   Transfers: Sit to/from Stand Sit to Stand: Supervision         General transfer comment: supervision for safety     Balance Overall balance assessment: Mild deficits observed, not formally tested                                         ADL either performed or assessed with clinical judgement   ADL Overall ADL's : Needs assistance/impaired Eating/Feeding: Modified independent;Sitting   Grooming: Supervision/safety;Wash/dry hands;Wash/dry face;Standing   Upper  Body Bathing: Supervision/ safety;Sitting   Lower Body Bathing: Supervison/ safety;Sit to/from stand   Upper Body Dressing : Supervision/safety;Sitting   Lower Body Dressing: Min guard;Sit to/from stand   Toilet Transfer: Supervision/safety;Ambulation;Regular Toilet   Toileting- Water quality scientist and Hygiene: Supervision/safety;Sit to/from stand       Functional mobility during ADLs: Supervision/safety       Vision Baseline Vision/History: Wears glasses Wears Glasses: At all times Patient Visual Report: No change from baseline Vision Assessment?: No apparent visual deficits;Yes Eye Alignment: Within Functional Limits Ocular Range of Motion: Within Functional Limits Tracking/Visual Pursuits: Able to track stimulus in all quads without difficulty Visual Fields: No apparent deficits                Pertinent Vitals/Pain Pain Assessment: Faces Faces Pain Scale: Hurts a little bit Pain Location: pain in bil LEs Pain Descriptors / Indicators: Sore Pain Intervention(s): Monitored during session     Hand Dominance Right   Extremity/Trunk Assessment Upper Extremity Assessment Upper Extremity Assessment: Overall WFL for tasks assessed   Lower Extremity Assessment Lower Extremity Assessment: Defer to PT evaluation       Communication Communication Communication: No difficulties   Cognition Arousal/Alertness: Awake/alert Behavior During Therapy: WFL for tasks assessed/performed Overall Cognitive Status: Within Functional Limits for tasks assessed                                 General  Comments: Pt A&O, when asked to read from menu items for "tomorrow", pt reading today's meal instead; otherwise appears [place] expects to be discharged to:: Private residence Living Arrangements: Children(daughter, 2 grandchildren) Available Help at Discharge: Family Type of Home: Apartment Home Access: Stairs to  enter Technical brewer of Steps: flight   Home Layout: One level     Bathroom Shower/Tub: Teacher, early years/pre: [place]: None          Prior Functioning/Environment Level of Independence: Independent                 OT Problem List: Decreased strength;Decreased activity tolerance            OT Goals(Current goals can be found in the care plan section) Acute Rehab OT Goals Patient Stated Goal: return home OT Goal Formulation: All assessment and education complete, DC therapy                                 AM-PAC PT "6 Clicks" Daily Activity     Outcome Measure Help from another person eating meals?: None Help from another person taking care of personal grooming?: None Help from another person toileting, which includes using toliet, bedpan, or urinal?: None Help from another person bathing (including washing, rinsing, drying)?: A Little Help from another person to put on and taking off regular upper body clothing?: None Help from another person to put on and taking off regular lower body clothing?: A Little 6 Click Score: 22   End of Session Nurse Communication: Mobility status  Activity Tolerance: Patient tolerated treatment well Patient left: in chair;with call bell/phone within reach;with family/visitor present  OT Visit Diagnosis: Other symptoms and signs involving the nervous system (R29.898)                Time: 0347-4259 OT Time Calculation (min): 14 min Charges:  OT General Charges $OT Visit: 1 Visit OT Evaluation $OT Eval Low Complexity: 1 Low G-Codes:     Lou Cal, OT Pager [phone] 10/27/2017  Raymondo Band 10/27/2017, 3:39 PM

## 2017-10-27 NOTE — Progress Notes (Addendum)
Subjective: Seen in EEG room. Feeling better this morning, has not walked by herself but feels that her "waddling" has improved. She denies any similar episodes in the past.  Exam: Vitals:   10/27/17 0600 10/27/17 0700  BP: 131/70 136/68  Pulse: (!) 54 (!) 59  Resp: 18 18  Temp: 98.5 F (36.9 C) 97.9 F (36.6 C)  SpO2: 97% 97%   Physical Exam  Constitutional: Appears well-developed and well-nourished.  Psych: Affect appropriate to situation Eyes: No scleral injection HENT: Small healing bite marks on tongue, No OP obstrucion Head: Normocephalic.  Respiratory: Effort normal, non-labored breathing GI:  No distension.   Neuro: Mental Status: Patient is awake, alert, and oriented Patient is able to give a clear and coherent history. No signs of aphasia or neglect Cranial Nerves: II: Pupils are equal, round, and reactive to light. III,IV, VI: EOMI without ptosis or diploplia.  V: Facial sensation is symmetric to light touch VII: Facial movement is symmetric.  VIII: hearing is intact to voice X: Uvula elevates symmetrically XI: Shoulder shrug is symmetric. XII: tongue is midline with extension  Motor: Tone is normal. Bulk is normal. 5/5 strength was present in all four extremities except 4/5 of the LUE at the deltoid. 5/5 LUE at biceps and triceps. Sensory: Sensation is symmetric to light touch in the arms and legs. Deep Tendon Reflexes: 2+ and symmetric in the biceps and patellae.  Cerebellar: FNF and HKS are intact bilaterally   Pertinent Labs: Lipid Panel     Component Value Date/Time   CHOL 173 10/27/2017 0430   TRIG 132 10/27/2017 0430   HDL 61 10/27/2017 0430   CHOLHDL 2.8 10/27/2017 0430   VLDL 26 10/27/2017 0430   LDLCALC 86 10/27/2017 0430   Hgb A1c 4.7  Impression:  57 y.o. female with a past medical history of breast cancer on Femara, hypertension, dyslipidemia, migraines on Fioricet who presents the ED for evaluation of tongue bite overnight,  unsteady gait, confusion with expressive aphasia which she noticed upon waking up this morning.   1.Possible seizure activity. EEG to be completed today. She does have risk factors for CVA including HTN, HLD, and tobacco use however imaging including CTA Head/Neck, MRI Brain, and MRA Head were without evidence of acute infarct or structural cause of seizure.  2. HTN 3. Tobacco Abuse 4. Pt with hx of Migraines 5. Breast cancer -  On Femara 6. Depression - on Paroxetine   Recommendations: 1) f/u EEG - withhold antiepileptics at this time. If EEG does show seizure activity then we will plan for medical therapy to treat a first time seizure in this patient. 2) PT/OT/SLP evaluations pending 3) Continue risk factor modification  ADDENDUM: EEG 10/27/17: Description: The patient is awake and asleep during the recording.  During maximal wakefulness, there is a symmetric, medium voltage 10 Hz posterior dominant rhythm that attenuates with eye opening.  The record is symmetric.  During drowsiness and sleep, there is an increase in theta slowing of the background.  Vertex waves and symmetric sleep spindles were seen.  Hyperventilation and photic stimulation did not elicit any abnormalities.  Intermittent left frontotemporal spike and wave discharges were seen with mild spread to the right frontal region.  No electrographic seizures were seen. EKG lead was unremarkable. Impression: This awake and asleep EEG is abnormal due to left frontotemporal spike and wave discharges.  Clinical Correlation: The above findings indicate cortical irritation with increased risk of seizures from this region.  Dr. Cheral Marker to review  EEG and determine management options.   Zada Finders, MD Internal Medicine PGY-3     Attending attestation: Possible seizure during sleep. No prior history of seizures. Presented with right tongue bite noted on awakening, with unsteady gait, expressive aphasia and confusion. Now back  to baseline. MRI brain negative. EEG pending.    Electronically signed: Dr. Kerney Elbe

## 2017-10-27 NOTE — Progress Notes (Signed)
10/27/2017 PT Evaluation completed.  Full note to follow.  Pt is independent in all of her mobility. She did stairs simulating home entry. She has no current acute or f/u therapy needs at this time.  PT to sign off.   Thanks,  Barbarann Ehlers. Bowman, [place], DPT [phone]

## 2017-10-27 NOTE — Evaluation (Addendum)
Physical Therapy Evaluation/Discharge.  Patient Details Name: Eileen Holland MRN: [id number] DOB: 08/27/1960 Today's Date: 10/27/2017   History of Present Illness  57 year old female with history of breast cancer on Femara, hypertension, dyslipidemia presented on 10/26/2017 with confusion, some expressive aphasia, unsteady gait and tongue bite.  Neurology was consulted.  Initial CT angiogram of the head and neck was unremarkable.  EEG is pending.    Clinical Impression  Pt is independent with all mobility despite reports of sore bil thighs.  She was able to walk independently and do stairs with a rail.  No focal deficits noted, no functional weakness noted, no balance deficits noted.  PT to sign off as she has no acute or f/u therapy needs at this time.     Follow Up Recommendations No PT follow up    Equipment Recommendations  None recommended by PT    Recommendations for Other Services       Precautions / Restrictions Precautions Precautions: None      Mobility  Bed Mobility Overal bed mobility: Independent                Transfers Overall transfer level: Independent                  Ambulation/Gait Ambulation/Gait assistance: Independent Ambulation Distance (Feet): 350 Feet Assistive device: None Gait Pattern/deviations: WFL(Within Functional Limits)   Gait velocity interpretation: at or above normal speed for age/gender    Stairs Stairs: Yes Stairs assistance: Modified independent (Device/Increase time) Stair Management: One rail Left;One rail Right;Alternating pattern;Forwards Number of Stairs: 5 General stair comments: Pt able to go up stairs without sign of struggle using the rail.          Balance Overall balance assessment: No apparent balance deficits (not formally assessed)                                           Pertinent Vitals/Pain Pain Assessment: Faces Faces Pain Scale: Hurts little more Pain Location: bil thigh  soreness "like I worked out really hardArt therapist / Indicators: Sore Pain Intervention(s): Limited activity within patient's tolerance;Monitored during session;Repositioned    Home Living Family/patient expects to be discharged to:: Private residence Living Arrangements: Children(daughter, 2 grand children) Available Help at Discharge: Family Type of Home: Apartment Home Access: Stairs to enter Entrance Stairs-Rails: Left Entrance Stairs-Number of Steps: flight Home Layout: One level Home Equipment: None      Prior Function Level of Independence: Independent         Comments: 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, rides the bus     Hand Dominance   Dominant Hand: Right    Extremity/Trunk Assessment   Upper Extremity Assessment Upper Extremity Assessment: Defer to OT evaluation    Lower Extremity Assessment Lower Extremity Assessment: Overall WFL for tasks assessed(no obvious asymetries or functional deficits.)    Cervical / Trunk Assessment Cervical / Trunk Assessment: Normal  Communication   Communication: Other (comment)(per pt and her sister, still some slower, slurred speech)  Cognition Arousal/Alertness: Awake/alert Behavior During Therapy: WFL for tasks assessed/performed Overall Cognitive Status: Within Functional Limits for tasks assessed  Assessment/Plan    PT Assessment Patent does not need any further PT services         PT Goals (Current goals can be found in the Care Plan section)  Acute Rehab PT Goals Patient Stated Goal: return home PT Goal Formulation: All assessment and education complete, DC therapy               AM-PAC PT "6 Clicks" Daily Activity  Outcome Measure Difficulty turning over in bed (including adjusting bedclothes, sheets and blankets)?: None Difficulty moving from lying on back to sitting on the side of the bed? : None Difficulty sitting down on  and standing up from a chair with arms (e.g., wheelchair, bedside commode, etc,.)?: None Help needed moving to and from a bed to chair (including a wheelchair)?: None Help needed walking in hospital room?: None Help needed climbing 3-5 steps with a railing? : None 6 Click Score: 24    End of Session   Activity Tolerance: Patient tolerated treatment well Patient left: in bed;with call bell/phone within reach;with family/visitor present   PT Visit Diagnosis: Other symptoms and signs involving the nervous system (C62.376)    Time: 2831-5176 PT Time Calculation (min) (ACUTE ONLY): 22 min   Charges:        Wells Guiles B. Case Vassell, PT, DPT [phone]     PT Evaluation $PT Eval Moderate Complexity: 1 Mod     10/27/2017, 10:05 PM

## 2017-10-27 NOTE — Progress Notes (Signed)
Patient ID: Eileen Holland, female   DOB: 28-Jun-1961, 57 y.o.   MRN: [id number]  PROGRESS NOTE    Eileen Holland  CWC:[id number] DOB: 1960/11/22 DOA: 10/26/2017 PCP: [name], MD   Brief Narrative:  57 year old female with history of breast cancer on Femara, hypertension, dyslipidemia presented on 10/26/2017 with confusion, some expressive aphasia, unsteady gait and tongue bite.  Neurology was consulted.  Initial CT angiogram of the head and neck was unremarkable.   Assessment & Plan:   Principal Problem:   Acute CVA (cerebrovascular accident) ([place]) Active Problems:   HTN (hypertension)   HLD (hyperlipidemia)   Major depression in remission ([place])   Headache, migraine   Possible seizure presenting with tongue bite/expressive aphasia and confusion -Neurology following.  EEG pending.  MRI of the brain was negative for stroke -No further seizure activities. -PT/OT/SLP evaluation -Monitor mental status   Hypertension: Monitor blood pressure.  Antihypertensives are on hold for now  Dyslipidemia: Continue statin  Breast cancer: Restart Femara  Mild hypokalemia:  Repeat a.m. potassium  DVT prophylaxis: Lovenox Code Status: Full Family Communication: None at bedside Disposition Plan: Home once cleared by neurology  Consultants: Neurology  Procedures: None  Antimicrobials: None   Subjective: Patient seen and examined at bedside.  She denies any current chest pain, nausea, vomiting.  No overnight seizure-like activities.  Objective: Vitals:   10/27/17 0223 10/27/17 0406 10/27/17 0600 10/27/17 0700  BP: (!) 149/78 (!) 139/94 131/70 136/68  Pulse: (!) 58 66 (!) 54 (!) 59  Resp: 18 18 18 18   Temp:  97.7 F (36.5 C) 98.5 F (36.9 C) 97.9 F (36.6 C)  TempSrc:  Axillary Oral Oral  SpO2: 95% 92% 97% 97%  Weight:      Height:        Intake/Output Summary (Last 24 hours) at 10/27/2017 1206 Last data filed at 10/27/2017 0556 Gross per 24 hour  Intake 201.67 ml    Output -  Net 201.67 ml   Filed Weights   10/26/17 1345 10/26/17 2048  Weight: 69.4 kg (153 lb) 67.5 kg (148 lb 13 oz)    Examination:  General exam: Appears calm and comfortable  Respiratory system: Bilateral decreased breath sound at bases Cardiovascular system: S1 & S2 heard, mild bradycardia  gastrointestinal system: Abdomen is nondistended, soft and nontender. Normal bowel sounds heard. Central nervous system: Alert and oriented. No focal neurological deficits. Moving extremities Extremities: No cyanosis, clubbing, edema    Data Reviewed: I have personally reviewed following labs and imaging studies  CBC: Recent Labs  Lab 10/26/17 1425 10/26/17 1432 10/26/17 2114  WBC 13.6*  --  12.7*  NEUTROABS 11.1*  --   --   HGB 13.1 13.6 11.9*  HCT 37.4 40.0 35.8*  MCV 103.0*  --  102.0*  PLT 239  --  761   Basic Metabolic Panel: Recent Labs  Lab 10/26/17 1425 10/26/17 1432 10/26/17 2114  NA 137 138  --   K 3.2* 3.2*  --   CL 99* 99*  --   CO2 28  --   --   GLUCOSE 90 85  --   BUN 8 7  --   CREATININE 0.79 0.80 0.82  CALCIUM 9.8  --   --    GFR: Estimated Creatinine Clearance: 69.9 mL/min (by C-G formula based on SCr of 0.82 mg/dL). Liver Function Tests: Recent Labs  Lab 10/26/17 1425  AST 37  ALT 22  ALKPHOS 67  BILITOT 0.7  PROT 8.1  ALBUMIN  4.3   No results for input(s): LIPASE, AMYLASE in the last 168 hours. No results for input(s): AMMONIA in the last 168 hours. Coagulation Profile: Recent Labs  Lab 10/26/17 1425  INR 1.00   Cardiac Enzymes: No results for input(s): CKTOTAL, CKMB, CKMBINDEX, TROPONINI in the last 168 hours. BNP (last 3 results) No results for input(s): PROBNP in the last 8760 hours. HbA1C: Recent Labs    10/27/17 0430  HGBA1C 4.7*   CBG: No results for input(s): GLUCAP in the last 168 hours. Lipid Profile: Recent Labs    10/27/17 0430  CHOL 173  HDL 61  LDLCALC 86  TRIG 132  CHOLHDL 2.8   Thyroid Function  Tests: No results for input(s): TSH, T4TOTAL, FREET4, T3FREE, THYROIDAB in the last 72 hours. Anemia Panel: No results for input(s): VITAMINB12, FOLATE, FERRITIN, TIBC, IRON, RETICCTPCT in the last 72 hours. Sepsis Labs: No results for input(s): PROCALCITON, LATICACIDVEN in the last 168 hours.  No results found for this or any previous visit (from the past 240 hour(s)).       Radiology Studies: Ct Angio Head W Or Wo Contrast  Result Date: 10/26/2017 CLINICAL DATA:  Awoke with pain in the tongue and gait disturbance. Slurred speech. Expressive aphasia. EXAM: CT ANGIOGRAPHY HEAD AND NECK TECHNIQUE: Multidetector CT imaging of the head and neck was performed using the standard protocol during bolus administration of intravenous contrast. Multiplanar CT image reconstructions and MIPs were obtained to evaluate the vascular anatomy. Carotid stenosis measurements (when applicable) are obtained utilizing NASCET criteria, using the distal internal carotid diameter as the denominator. CONTRAST:  143mL ISOVUE-370 IOPAMIDOL (ISOVUE-370) INJECTION 76% COMPARISON:  None. FINDINGS: CT HEAD FINDINGS Brain: The brain shows a normal appearance without evidence of malformation, atrophy, old or acute small or large vessel infarction, mass lesion, hemorrhage, hydrocephalus or extra-axial collection. Vascular: No hyperdense vessel. No evidence of atherosclerotic calcification. Skull: Normal.  No traumatic finding.  No focal bone lesion. Sinuses/Orbits: Sinuses are clear. Orbits appear normal. Mastoids are clear. Other: None significant CTA NECK FINDINGS Aortic arch: Negative Right carotid system: Common carotid artery widely patent to the bifurcation region. Carotid bifurcation normal without soft or calcified plaque. Cervical ICA is tortuous but normal. Left carotid system: Left common carotid artery widely patent to the bifurcation region. Carotid bifurcation is normal without soft or calcified plaque. Cervical ICA is  tortuous but widely patent. Vertebral arteries: Both vertebral artery origins are widely patent. The vertebral arteries are approximately equal in size and widely patent through the cervical region to the foramen magnum. The vessels are quite tortuous. Skeleton: Ordinary mid cervical spondylosis. Other neck: No mass or lymphadenopathy. Upper chest: Negative Review of the MIP images confirms the above findings CTA HEAD FINDINGS Anterior circulation: Both internal carotid arteries are patent through the skull base and siphon regions. Minimal atherosclerotic calcification in the carotid siphons but no stenosis. The anterior and middle cerebral vessels are patent without proximal stenosis, aneurysm or vascular malformation. No missing branch vessels identified. Fetal origin of both posterior cerebral arteries. Posterior circulation: Both vertebral arteries are patent through the foramen magnum to the basilar. The vertebrobasilar system is small, because of primary fetal origin of the posterior cerebral arteries. No posterior circulation branch vessel abnormality is seen. Venous sinuses: Patent and normal. Anatomic variants: None other. Delayed phase: Abnormal enhancement. Review of the MIP images confirms the above findings IMPRESSION: Normal appearance of the brain. No visible stroke. No sign of metastatic disease. Negative CT angiography. No evidence of  atherosclerotic disease. No large or medium vessel occlusion. Electronically Signed   By: Nelson Chimes M.D.   On: 10/26/2017 15:41   Dg Chest 2 View  Result Date: 10/26/2017 CLINICAL DATA:  Possible seizure. EXAM: CHEST - 2 VIEW COMPARISON:  09/07/2015 FINDINGS: The cardiac silhouette is mildly enlarged. The lungs are normally to mildly hyperinflated. No airspace consolidation, edema, pleural effusion, pneumothorax is identified. No acute osseous abnormality is seen. IMPRESSION: No active cardiopulmonary disease. Electronically Signed   By: Logan Bores M.D.   On:  10/26/2017 15:03   Ct Angio Neck W And/or Wo Contrast  Result Date: 10/26/2017 CLINICAL DATA:  Awoke with pain in the tongue and gait disturbance. Slurred speech. Expressive aphasia. EXAM: CT ANGIOGRAPHY HEAD AND NECK TECHNIQUE: Multidetector CT imaging of the head and neck was performed using the standard protocol during bolus administration of intravenous contrast. Multiplanar CT image reconstructions and MIPs were obtained to evaluate the vascular anatomy. Carotid stenosis measurements (when applicable) are obtained utilizing NASCET criteria, using the distal internal carotid diameter as the denominator. CONTRAST:  112mL ISOVUE-370 IOPAMIDOL (ISOVUE-370) INJECTION 76% COMPARISON:  None. FINDINGS: CT HEAD FINDINGS Brain: The brain shows a normal appearance without evidence of malformation, atrophy, old or acute small or large vessel infarction, mass lesion, hemorrhage, hydrocephalus or extra-axial collection. Vascular: No hyperdense vessel. No evidence of atherosclerotic calcification. Skull: Normal.  No traumatic finding.  No focal bone lesion. Sinuses/Orbits: Sinuses are clear. Orbits appear normal. Mastoids are clear. Other: None significant CTA NECK FINDINGS Aortic arch: Negative Right carotid system: Common carotid artery widely patent to the bifurcation region. Carotid bifurcation normal without soft or calcified plaque. Cervical ICA is tortuous but normal. Left carotid system: Left common carotid artery widely patent to the bifurcation region. Carotid bifurcation is normal without soft or calcified plaque. Cervical ICA is tortuous but widely patent. Vertebral arteries: Both vertebral artery origins are widely patent. The vertebral arteries are approximately equal in size and widely patent through the cervical region to the foramen magnum. The vessels are quite tortuous. Skeleton: Ordinary mid cervical spondylosis. Other neck: No mass or lymphadenopathy. Upper chest: Negative Review of the MIP images  confirms the above findings CTA HEAD FINDINGS Anterior circulation: Both internal carotid arteries are patent through the skull base and siphon regions. Minimal atherosclerotic calcification in the carotid siphons but no stenosis. The anterior and middle cerebral vessels are patent without proximal stenosis, aneurysm or vascular malformation. No missing branch vessels identified. Fetal origin of both posterior cerebral arteries. Posterior circulation: Both vertebral arteries are patent through the foramen magnum to the basilar. The vertebrobasilar system is small, because of primary fetal origin of the posterior cerebral arteries. No posterior circulation branch vessel abnormality is seen. Venous sinuses: Patent and normal. Anatomic variants: None other. Delayed phase: Abnormal enhancement. Review of the MIP images confirms the above findings IMPRESSION: Normal appearance of the brain. No visible stroke. No sign of metastatic disease. Negative CT angiography. No evidence of atherosclerotic disease. No large or medium vessel occlusion. Electronically Signed   By: Nelson Chimes M.D.   On: 10/26/2017 15:41   Mr Brain Wo Contrast  Result Date: 10/26/2017 CLINICAL DATA:  57 y/o F; photophobia, tongue bite, headache, unsteady gait, lightheadedness. History of breast cancer. EXAM: MRI HEAD WITHOUT CONTRAST TECHNIQUE: Multiplanar, multiecho pulse sequences of the brain and surrounding structures were obtained without intravenous contrast. COMPARISON:  10/26/2017 CT angiogram of the head. FINDINGS: Brain: No acute infarction, hemorrhage, hydrocephalus, extra-axial collection or mass lesion. No  disorder cortical formation, gray matter heterotopia, or cortical dysplasia identified. Hippocampi are symmetric in size and signal. Several foci of T2 FLAIR hyperintense signal abnormality are present within subcortical and periventricular white matter as well as the pons. No lesion is identified within corpus callosum, basal  ganglia, or cerebellum. Vascular: Normal flow voids. Skull and upper cervical spine: Normal marrow signal. Sinuses/Orbits: Negative. Other: None. IMPRESSION: 1. No acute intracranial abnormality identified. 2. No structural cause of seizure. 3. Nonspecific T2 FLAIR hyperintense foci are present in supratentorial white matter and brainstem, probably represent chronic microvascular ischemic changes given risk factors. Sequelae of demyelination considered less likely. Electronically Signed   By: Kristine Garbe M.D.   On: 10/26/2017 20:47   Mr Jodene Nam Head Wo Contrast  Result Date: 10/27/2017 CLINICAL DATA:  Photophobia and headache. EXAM: MRA HEAD WITHOUT CONTRAST TECHNIQUE: Angiographic images of the Circle of Willis were obtained using MRA technique without intravenous contrast. COMPARISON:  Brain MRI and CTA head neck 10/26/2017 FINDINGS: Intracranial internal carotid arteries: Normal Anterior cerebral arteries: Normal. Middle cerebral arteries: Normal. Posterior communicating arteries: Present bilaterally. Posterior cerebral arteries: Normal. Basilar artery: Normal. Vertebral arteries: Left dominant. Normal. Superior cerebellar arteries: Normal. Anterior inferior cerebellar arteries: Normal. Posterior inferior cerebellar arteries: Normal. IMPRESSION: Normal intracranial MRA. Electronically Signed   By: Ulyses Jarred M.D.   On: 10/27/2017 06:45        Scheduled Meds: . atorvastatin  80 mg Oral q1800  . cholecalciferol  1,000 Units Oral Daily  . clopidogrel  75 mg Oral Daily  . docusate sodium  100 mg Oral BID  . enoxaparin (LOVENOX) injection  40 mg Subcutaneous Q24H  . loratadine  10 mg Oral Daily  . PARoxetine  20 mg Oral BH-q7a   Continuous Infusions: . sodium chloride 10 mL/hr at 10/27/17 0702     LOS: 1 day        Aline August, MD [facility] Pager [phone]  If 7PM-7AM, please contact night-coverage [email or web address] Password [facility] 10/27/2017, 12:06 PM

## 2017-10-27 NOTE — Progress Notes (Signed)
EEG COMPLETE; RESULTS PENDING. 

## 2017-10-27 NOTE — Procedures (Signed)
ELECTROENCEPHALOGRAM REPORT  Date of Study: 10/27/2017  Patient's Name: Eileen Holland MRN: [id number] Date of Birth: 1960-12-30  Referring Provider: Pedro Earls, NP  Clinical History: 57 year old female with migraines presents with episode of confusion and expressive aphasia.  Medications: Paxil Plavix Claritin  Technical Summary: A multichannel digital EEG recording measured by the international 10-20 system with electrodes applied with paste and impedances below 5000 ohms performed in our laboratory with EKG monitoring in an awake and asleep patient.  Hyperventilation was not performed.  Photic stimulation was performed.  The digital EEG was referentially recorded, reformatted, and digitally filtered in a variety of bipolar and referential montages for optimal display.    Description: The patient is awake and asleep during the recording.  During maximal wakefulness, there is a symmetric, medium voltage 10 Hz posterior dominant rhythm that attenuates with eye opening.  The record is symmetric.  During drowsiness and sleep, there is an increase in theta slowing of the background.  Vertex waves and symmetric sleep spindles were seen.  Hyperventilation and photic stimulation did not elicit any abnormalities.  Intermittent left frontotemporal spike and wave discharges were seen with mild spread to the right frontal region.  No electrographic seizures were seen.  EKG lead was unremarkable.  Impression: This awake and asleep EEG is abnormal due to left frontotemporal spike and wave discharges.  Clinical Correlation: The above findings indicate cortical irritation with increased risk of seizures from this region.  Metta Clines, DO

## 2017-10-27 NOTE — Progress Notes (Signed)
Patient received alert and oriented x 4, skin assessment done by this nurse and Juliann Pulse NT. On head to toe assessment no pressure ulcer noted.

## 2017-10-27 NOTE — Progress Notes (Signed)
OT Cancellation Note  Patient Details Name: Eileen Holland MRN: [id number] DOB: 19-Jun-1961   Cancelled Treatment:    Reason Eval/Treat Not Completed: Patient at procedure or test/ unavailable. Pt at EEG, will re attempt later as time allows/as appropriate  Britt Bottom 10/27/2017, 10:13 AM

## 2017-10-27 NOTE — Progress Notes (Signed)
SLP Cancellation Note  Patient Details Name: Eileen Holland MRN: [id number] DOB: [birth year]   Cancelled treatment:       Reason Eval/Treat Not Completed: SLP screened, no needs identified, will sign off   [name] 10/27/2017, 12:13 PM

## 2017-10-28 ENCOUNTER — Inpatient Hospital Stay (HOSPITAL_COMMUNITY): Payer: Managed Care, Other (non HMO)

## 2017-10-28 DIAGNOSIS — R569 Unspecified convulsions: Secondary | ICD-10-CM | POA: Diagnosis present

## 2017-10-28 LAB — BASIC METABOLIC PANEL
Anion gap: 11 (ref 5–15)
BUN: 11 mg/dL (ref 6–20)
CALCIUM: 9.3 mg/dL (ref 8.9–10.3)
CHLORIDE: 105 mmol/L (ref 101–111)
CO2: 25 mmol/L (ref 22–32)
CREATININE: 0.84 mg/dL (ref 0.44–1.00)
Glucose, Bld: 91 mg/dL (ref 65–99)
Potassium: 3.8 mmol/L (ref 3.5–5.1)
SODIUM: 141 mmol/L (ref 135–145)

## 2017-10-28 LAB — CBC WITH DIFFERENTIAL/PLATELET
BASOS ABS: 0 10*3/uL (ref 0.0–0.1)
BASOS PCT: 0 %
EOS ABS: 0.1 10*3/uL (ref 0.0–0.7)
EOS PCT: 2 %
HCT: 38.4 % (ref 36.0–46.0)
HEMOGLOBIN: 12.7 g/dL (ref 12.0–15.0)
Lymphocytes Relative: 28 %
Lymphs Abs: 2.2 10*3/uL (ref 0.7–4.0)
MCH: 34.5 pg — AB (ref 26.0–34.0)
MCHC: 33.1 g/dL (ref 30.0–36.0)
MCV: 104.3 fL — ABNORMAL HIGH (ref 78.0–100.0)
Monocytes Absolute: 0.4 10*3/uL (ref 0.1–1.0)
Monocytes Relative: 5 %
NEUTROS PCT: 65 %
Neutro Abs: 5.1 10*3/uL (ref 1.7–7.7)
PLATELETS: 226 10*3/uL (ref 150–400)
RBC: 3.68 MIL/uL — AB (ref 3.87–5.11)
RDW: 12 % (ref 11.5–15.5)
WBC: 7.8 10*3/uL (ref 4.0–10.5)

## 2017-10-28 LAB — ECHOCARDIOGRAM COMPLETE
HEIGHTINCHES: 62 in
Weight: 2380.97 oz

## 2017-10-28 LAB — MAGNESIUM: MAGNESIUM: 1.9 mg/dL (ref 1.7–2.4)

## 2017-10-28 MED ORDER — LEVETIRACETAM 500 MG PO TABS: 500.0000 mg | ORAL_TABLET | Freq: Two times a day (BID) | ORAL | 0 refills | Status: DC

## 2017-10-28 NOTE — Progress Notes (Signed)
Subjective: No complaints. Started on Keppra after EEG came back positive.   Objective: Current vital signs: BP 128/67 (BP Location: Left Arm)   Pulse 88   Temp 98.1 F (36.7 C) (Oral)   Resp 16   Ht '5\' 2"'  (1.575 m)   Wt 67.5 kg (148 lb 13 oz)   LMP 03/14/2011   SpO2 100%   BMI 27.22 kg/m  Vital signs in last 24 hours: Temp:  [97.8 F (36.6 C)-99.2 F (37.3 C)] 98.1 F (36.7 C) (03/16 0434) Pulse Rate:  [62-88] 88 (03/16 0434) Resp:  [16-18] 16 (03/16 0434) BP: (119-142)/(67-70) 128/67 (03/16 0434) SpO2:  [100 %] 100 % (03/16 0434)  Intake/Output from previous day: 03/15 0701 - 03/16 0700 In: 464 [P.O.:240; I.V.:124; IV Piggyback:100] Out: -  Intake/Output this shift: No intake/output data recorded. Nutritional status: Fall precautions Seizure precautions Fall precautions Seizure precautions Diet Heart Room service appropriate? Yes; Fluid consistency: Thin  Neurologic Exam: Ment: Intact to complex questions and commands. CN: EOMI. Face symmetric. Some dysarthria secondary to tongue-bite.  Motor:5/5 x 4 Sensory: Intact to FT proximally x 4.  Cerebellar: No ataxia  Lab Results: Results for orders placed or performed during the hospital encounter of 10/26/17 (from the past 48 hour(s))  Ethanol     Status: None   Collection Time: 10/26/17  2:25 PM  Result Value Ref Range   Alcohol, Ethyl (B) <10 <10 mg/dL    Comment:        LOWEST DETECTABLE LIMIT FOR SERUM ALCOHOL IS 10 mg/dL FOR MEDICAL PURPOSES ONLY Performed at [facility] [address] [place], [place] [address]   Protime-INR     Status: None   Collection Time: 10/26/17  2:25 PM  Result Value Ref Range   Prothrombin Time 13.1 11.4 - 15.2 seconds   INR 1.00     Comment: Performed at [facility], [place] [address], [place], [place] [address]  APTT     Status: None   Collection Time: 10/26/17  2:25 PM  Result Value Ref Range   aPTT 31 24 - 36 seconds    Comment:  Performed at [facility], [place] [address] [place], [place] [address]  CBC     Status: Abnormal   Collection Time: 10/26/17  2:25 PM  Result Value Ref Range   WBC 13.6 (H) 4.0 - 10.5 K/uL   RBC 3.63 (L) 3.87 - 5.11 MIL/uL   Hemoglobin 13.1 12.0 - 15.0 g/dL   HCT 37.4 36.0 - 46.0 %   MCV 103.0 (H) 78.0 - 100.0 fL   MCH 36.1 (H) 26.0 - 34.0 pg   MCHC 35.0 30.0 - 36.0 g/dL   RDW 11.9 11.5 - 15.5 %   Platelets 239 150 - 400 K/uL    Comment: Performed at [facility], [place] [address], [place], [place] [address]  Differential     Status: Abnormal   Collection Time: 10/26/17  2:25 PM  Result Value Ref Range   Neutrophils Relative % 82 %   Neutro Abs 11.1 (H) 1.7 - 7.7 K/uL   Lymphocytes Relative 13 %   Lymphs Abs 1.7 0.7 - 4.0 K/uL   Monocytes Relative 5 %   Monocytes Absolute 0.7 0.1 - 1.0 K/uL   Eosinophils Relative 0 %   Eosinophils Absolute 0.0 0.0 - 0.7 K/uL   Basophils Relative 0 %   Basophils Absolute 0.0 0.0 - 0.1 K/uL    Comment: Performed at [facility]  [place] [address], [place], [place] [address]  Comprehensive metabolic panel     Status: Abnormal   Collection Time: 10/26/17  2:25 PM  Result Value Ref Range   Sodium 137 135 - 145 mmol/L   Potassium 3.2 (L) 3.5 - 5.1 mmol/L   Chloride 99 (L) 101 - 111 mmol/L   CO2 28 22 - 32 mmol/L   Glucose, Bld 90 65 - 99 mg/dL   BUN 8 6 - 20 mg/dL   Creatinine, Ser 0.79 0.44 - 1.00 mg/dL   Calcium 9.8 8.9 - 10.3 mg/dL   Total Protein 8.1 6.5 - 8.1 g/dL   Albumin 4.3 3.5 - 5.0 g/dL   AST 37 15 - 41 U/L   ALT 22 14 - 54 U/L   Alkaline Phosphatase 67 38 - 126 U/L   Total Bilirubin 0.7 0.3 - 1.2 mg/dL   GFR calc non Af Amer >60 >60 mL/min   GFR calc Af Amer >60 >60 mL/min    Comment: (NOTE) The eGFR has been calculated using the CKD EPI equation. This calculation has not been validated in all clinical situations. eGFR's persistently <60 mL/min signify possible Chronic  Kidney Disease.    Anion gap 10 5 - 15    Comment: Performed at [facility], [place] [address], [place], [place] [address]  I-stat troponin, ED     Status: None   Collection Time: 10/26/17  2:31 PM  Result Value Ref Range   Troponin i, poc 0.00 0.00 - 0.08 ng/mL   Comment 3            Comment: Due to the release kinetics of cTnI, a negative result within the first hours of the onset of symptoms does not rule out myocardial infarction with certainty. If myocardial infarction is still suspected, repeat the test at appropriate intervals.   I-Stat Chem 8, ED     Status: Abnormal   Collection Time: 10/26/17  2:32 PM  Result Value Ref Range   Sodium 138 135 - 145 mmol/L   Potassium 3.2 (L) 3.5 - 5.1 mmol/L   Chloride 99 (L) 101 - 111 mmol/L   BUN 7 6 - 20 mg/dL   Creatinine, Ser 0.80 0.44 - 1.00 mg/dL   Glucose, Bld 85 65 - 99 mg/dL   Calcium, Ion 1.16 1.15 - 1.40 mmol/L   TCO2 28 22 - 32 mmol/L   Hemoglobin 13.6 12.0 - 15.0 g/dL   HCT 40.0 36.0 - 46.0 %  Urine rapid drug screen (hosp performed)     Status: Abnormal   Collection Time: 10/26/17  3:05 PM  Result Value Ref Range   Opiates NONE DETECTED NONE DETECTED   Cocaine NONE DETECTED NONE DETECTED   Benzodiazepines NONE DETECTED NONE DETECTED   Amphetamines NONE DETECTED NONE DETECTED   Tetrahydrocannabinol NONE DETECTED NONE DETECTED   Barbiturates POSITIVE (A) NONE DETECTED    Comment: (NOTE) DRUG SCREEN FOR MEDICAL PURPOSES ONLY.  IF CONFIRMATION IS NEEDED FOR ANY PURPOSE, NOTIFY LAB WITHIN 5 DAYS. LOWEST DETECTABLE LIMITS FOR URINE DRUG SCREEN Drug Class                     Cutoff (ng/mL) Amphetamine and metabolites    1000 Barbiturate and metabolites    200 Benzodiazepine                 009 Tricyclics and metabolites     300 Opiates and metabolites  300 Cocaine and metabolites        300 THC                            50 Performed at [facility], [place] [address], [place], [place] [address]   Urinalysis, Routine w reflex microscopic     Status: Abnormal   Collection Time: 10/26/17  3:05 PM  Result Value Ref Range   Color, Urine YELLOW YELLOW   APPearance CLEAR CLEAR   Specific Gravity, Urine 1.004 (L) 1.005 - 1.030   pH 7.0 5.0 - 8.0   Glucose, UA NEGATIVE NEGATIVE mg/dL   Hgb urine dipstick MODERATE (A) NEGATIVE   Bilirubin Urine NEGATIVE NEGATIVE   Ketones, ur NEGATIVE NEGATIVE mg/dL   Protein, ur NEGATIVE NEGATIVE mg/dL   Nitrite NEGATIVE NEGATIVE   Leukocytes, UA TRACE (A) NEGATIVE   RBC / HPF 6-30 0 - 5 RBC/hpf   WBC, UA 0-5 0 - 5 WBC/hpf   Bacteria, UA NONE SEEN NONE SEEN   Squamous Epithelial / LPF 0-5 (A) NONE SEEN    Comment: Performed at [facility], [place] [address], [place], [place] [address]  HIV antibody (Routine Testing)     Status: None   Collection Time: 10/26/17  9:14 PM  Result Value Ref Range   HIV Screen 4th Generation wRfx Non Reactive Non Reactive    Comment: (NOTE) Performed At: [facility] [place], [place] [id number] Rush Farmer MD [id number] Performed at [facility], [place] [address], [place], [place] [address]   CBC     Status: Abnormal   Collection Time: 10/26/17  9:14 PM  Result Value Ref Range   WBC 12.7 (H) 4.0 - 10.5 K/uL   RBC 3.51 (L) 3.87 - 5.11 MIL/uL   Hemoglobin 11.9 (L) 12.0 - 15.0 g/dL   HCT 35.8 (L) 36.0 - 46.0 %   MCV 102.0 (H) 78.0 - 100.0 fL   MCH 33.9 26.0 - 34.0 pg   MCHC 33.2 30.0 - 36.0 g/dL   RDW 11.7 11.5 - 15.5 %   Platelets 218 150 - 400 K/uL    Comment: Performed at [facility], [place] [address] [place], [place] [address]  Creatinine, serum     Status: None   Collection Time: 10/26/17  9:14 PM  Result Value Ref Range   Creatinine, Ser 0.82 0.44 - 1.00 mg/dL   GFR calc non Af Amer >60 >60 mL/min   GFR calc Af Amer >60 >60 mL/min    Comment: (NOTE) The eGFR has been calculated using the CKD EPI equation. This  calculation has not been validated in all clinical situations. eGFR's persistently <60 mL/min signify possible Chronic Kidney Disease. Performed at [facility], [place] [address] [place], [place] [address]   Hemoglobin A1c     Status: Abnormal   Collection Time: 10/27/17  4:30 AM  Result Value Ref Range   Hgb A1c MFr Bld 4.7 (L) 4.8 - 5.6 %    Comment: (NOTE) Pre diabetes:          5.7%-6.4% Diabetes:              >6.4% Glycemic control for   <7.0% adults with diabetes    Mean Plasma Glucose 88.19 mg/dL    Comment: Performed at [facility] [address], [place], [place] [address]  Lipid panel  Status: None   Collection Time: 10/27/17  4:30 AM  Result Value Ref Range   Cholesterol 173 0 - 200 mg/dL   Triglycerides 132 <150 mg/dL   HDL 61 >40 mg/dL   Total CHOL/HDL Ratio 2.8 RATIO   VLDL 26 0 - 40 mg/dL   LDL Cholesterol 86 0 - 99 mg/dL    Comment:        Total Cholesterol/HDL:CHD Risk Coronary Heart Disease Risk Table                     Men   Women  1/2 Average Risk   3.4   3.3  Average Risk       5.0   4.4  2 X Average Risk   9.6   7.1  3 X Average Risk  23.4   11.0        Use the calculated Patient Ratio above and the CHD Risk Table to determine the patient's CHD Risk.        ATP III CLASSIFICATION (LDL):  <100     mg/dL   Optimal  100-129  mg/dL   Near or Above                    Optimal  130-159  mg/dL   Borderline  160-189  mg/dL   High  >190     mg/dL   Very High Performed at [facility] [address] [place], [place] [address]   Basic metabolic panel     Status: None   Collection Time: 10/28/17  4:09 AM  Result Value Ref Range   Sodium 141 135 - 145 mmol/L   Potassium 3.8 3.5 - 5.1 mmol/L   Chloride 105 101 - 111 mmol/L   CO2 25 22 - 32 mmol/L   Glucose, Bld 91 65 - 99 mg/dL   BUN 11 6 - 20 mg/dL   Creatinine, Ser 0.84 0.44 - 1.00 mg/dL   Calcium 9.3 8.9 - 10.3 mg/dL   GFR calc non Af Amer >60 >60 mL/min   GFR calc Af  Amer >60 >60 mL/min    Comment: (NOTE) The eGFR has been calculated using the CKD EPI equation. This calculation has not been validated in all clinical situations. eGFR's persistently <60 mL/min signify possible Chronic Kidney Disease.    Anion gap 11 5 - 15    Comment: Performed at [facility] [address], [place], [place] [address]  CBC with Differential/Platelet     Status: Abnormal   Collection Time: 10/28/17  4:09 AM  Result Value Ref Range   WBC 7.8 4.0 - 10.5 K/uL   RBC 3.68 (L) 3.87 - 5.11 MIL/uL   Hemoglobin 12.7 12.0 - 15.0 g/dL   HCT 38.4 36.0 - 46.0 %   MCV 104.3 (H) 78.0 - 100.0 fL   MCH 34.5 (H) 26.0 - 34.0 pg   MCHC 33.1 30.0 - 36.0 g/dL   RDW 12.0 11.5 - 15.5 %   Platelets 226 150 - 400 K/uL   Neutrophils Relative % 65 %   Neutro Abs 5.1 1.7 - 7.7 K/uL   Lymphocytes Relative 28 %   Lymphs Abs 2.2 0.7 - 4.0 K/uL   Monocytes Relative 5 %   Monocytes Absolute 0.4 0.1 - 1.0 K/uL   Eosinophils Relative 2 %   Eosinophils Absolute 0.1 0.0 - 0.7 K/uL   Basophils Relative 0 %   Basophils Absolute 0.0 0.0 -  0.1 K/uL    Comment: Performed at [facility], [place] [address], [place], [place] [address]  Magnesium     Status: None   Collection Time: 10/28/17  4:09 AM  Result Value Ref Range   Magnesium 1.9 1.7 - 2.4 mg/dL    Comment: Performed at [facility] [address] [place], [place] [address]    No results found for this or any previous visit (from the past 240 hour(s)).  Lipid Panel Recent Labs    10/27/17 0430  CHOL 173  TRIG 132  HDL 61  CHOLHDL 2.8  VLDL 26  LDLCALC 86    Studies/Results: Ct Angio Head W Or Wo Contrast  Result Date: 10/26/2017 CLINICAL DATA:  Awoke with pain in the tongue and gait disturbance. Slurred speech. Expressive aphasia. EXAM: CT ANGIOGRAPHY HEAD AND NECK TECHNIQUE: Multidetector CT imaging of the head and neck was performed using the standard protocol during bolus administration of intravenous  contrast. Multiplanar CT image reconstructions and MIPs were obtained to evaluate the vascular anatomy. Carotid stenosis measurements (when applicable) are obtained utilizing NASCET criteria, using the distal internal carotid diameter as the denominator. CONTRAST:  151m ISOVUE-370 IOPAMIDOL (ISOVUE-370) INJECTION 76% COMPARISON:  None. FINDINGS: CT HEAD FINDINGS Brain: The brain shows a normal appearance without evidence of malformation, atrophy, old or acute small or large vessel infarction, mass lesion, hemorrhage, hydrocephalus or extra-axial collection. Vascular: No hyperdense vessel. No evidence of atherosclerotic calcification. Skull: Normal.  No traumatic finding.  No focal bone lesion. Sinuses/Orbits: Sinuses are clear. Orbits appear normal. Mastoids are clear. Other: None significant CTA NECK FINDINGS Aortic arch: Negative Right carotid system: Common carotid artery widely patent to the bifurcation region. Carotid bifurcation normal without soft or calcified plaque. Cervical ICA is tortuous but normal. Left carotid system: Left common carotid artery widely patent to the bifurcation region. Carotid bifurcation is normal without soft or calcified plaque. Cervical ICA is tortuous but widely patent. Vertebral arteries: Both vertebral artery origins are widely patent. The vertebral arteries are approximately equal in size and widely patent through the cervical region to the foramen magnum. The vessels are quite tortuous. Skeleton: Ordinary mid cervical spondylosis. Other neck: No mass or lymphadenopathy. Upper chest: Negative Review of the MIP images confirms the above findings CTA HEAD FINDINGS Anterior circulation: Both internal carotid arteries are patent through the skull base and siphon regions. Minimal atherosclerotic calcification in the carotid siphons but no stenosis. The anterior and middle cerebral vessels are patent without proximal stenosis, aneurysm or vascular malformation. No missing branch  vessels identified. Fetal origin of both posterior cerebral arteries. Posterior circulation: Both vertebral arteries are patent through the foramen magnum to the basilar. The vertebrobasilar system is small, because of primary fetal origin of the posterior cerebral arteries. No posterior circulation branch vessel abnormality is seen. Venous sinuses: Patent and normal. Anatomic variants: None other. Delayed phase: Abnormal enhancement. Review of the MIP images confirms the above findings IMPRESSION: Normal appearance of the brain. No visible stroke. No sign of metastatic disease. Negative CT angiography. No evidence of atherosclerotic disease. No large or medium vessel occlusion. Electronically Signed   By: MNelson ChimesM.D.   On: 10/26/2017 15:41   Dg Chest 2 View  Result Date: 10/26/2017 CLINICAL DATA:  Possible seizure. EXAM: CHEST - 2 VIEW COMPARISON:  09/07/2015 FINDINGS: The cardiac silhouette is mildly enlarged. The lungs are normally to mildly hyperinflated. No airspace consolidation, edema, pleural effusion, pneumothorax is identified. No acute osseous abnormality  is seen. IMPRESSION: No active cardiopulmonary disease. Electronically Signed   By: Logan Bores M.D.   On: 10/26/2017 15:03   Ct Angio Neck W And/or Wo Contrast  Result Date: 10/26/2017 CLINICAL DATA:  Awoke with pain in the tongue and gait disturbance. Slurred speech. Expressive aphasia. EXAM: CT ANGIOGRAPHY HEAD AND NECK TECHNIQUE: Multidetector CT imaging of the head and neck was performed using the standard protocol during bolus administration of intravenous contrast. Multiplanar CT image reconstructions and MIPs were obtained to evaluate the vascular anatomy. Carotid stenosis measurements (when applicable) are obtained utilizing NASCET criteria, using the distal internal carotid diameter as the denominator. CONTRAST:  188m ISOVUE-370 IOPAMIDOL (ISOVUE-370) INJECTION 76% COMPARISON:  None. FINDINGS: CT HEAD FINDINGS Brain: The brain  shows a normal appearance without evidence of malformation, atrophy, old or acute small or large vessel infarction, mass lesion, hemorrhage, hydrocephalus or extra-axial collection. Vascular: No hyperdense vessel. No evidence of atherosclerotic calcification. Skull: Normal.  No traumatic finding.  No focal bone lesion. Sinuses/Orbits: Sinuses are clear. Orbits appear normal. Mastoids are clear. Other: None significant CTA NECK FINDINGS Aortic arch: Negative Right carotid system: Common carotid artery widely patent to the bifurcation region. Carotid bifurcation normal without soft or calcified plaque. Cervical ICA is tortuous but normal. Left carotid system: Left common carotid artery widely patent to the bifurcation region. Carotid bifurcation is normal without soft or calcified plaque. Cervical ICA is tortuous but widely patent. Vertebral arteries: Both vertebral artery origins are widely patent. The vertebral arteries are approximately equal in size and widely patent through the cervical region to the foramen magnum. The vessels are quite tortuous. Skeleton: Ordinary mid cervical spondylosis. Other neck: No mass or lymphadenopathy. Upper chest: Negative Review of the MIP images confirms the above findings CTA HEAD FINDINGS Anterior circulation: Both internal carotid arteries are patent through the skull base and siphon regions. Minimal atherosclerotic calcification in the carotid siphons but no stenosis. The anterior and middle cerebral vessels are patent without proximal stenosis, aneurysm or vascular malformation. No missing branch vessels identified. Fetal origin of both posterior cerebral arteries. Posterior circulation: Both vertebral arteries are patent through the foramen magnum to the basilar. The vertebrobasilar system is small, because of primary fetal origin of the posterior cerebral arteries. No posterior circulation branch vessel abnormality is seen. Venous sinuses: Patent and normal. Anatomic  variants: None other. Delayed phase: Abnormal enhancement. Review of the MIP images confirms the above findings IMPRESSION: Normal appearance of the brain. No visible stroke. No sign of metastatic disease. Negative CT angiography. No evidence of atherosclerotic disease. No large or medium vessel occlusion. Electronically Signed   By: MNelson ChimesM.D.   On: 10/26/2017 15:41   Mr Brain Wo Contrast  Result Date: 10/26/2017 CLINICAL DATA:  57y/o F; photophobia, tongue bite, headache, unsteady gait, lightheadedness. History of breast cancer. EXAM: MRI HEAD WITHOUT CONTRAST TECHNIQUE: Multiplanar, multiecho pulse sequences of the brain and surrounding structures were obtained without intravenous contrast. COMPARISON:  10/26/2017 CT angiogram of the head. FINDINGS: Brain: No acute infarction, hemorrhage, hydrocephalus, extra-axial collection or mass lesion. No disorder cortical formation, gray matter heterotopia, or cortical dysplasia identified. Hippocampi are symmetric in size and signal. Several foci of T2 FLAIR hyperintense signal abnormality are present within subcortical and periventricular white matter as well as the pons. No lesion is identified within corpus callosum, basal ganglia, or cerebellum. Vascular: Normal flow voids. Skull and upper cervical spine: Normal marrow signal. Sinuses/Orbits: Negative. Other: None. IMPRESSION: 1. No acute intracranial abnormality identified.  2. No structural cause of seizure. 3. Nonspecific T2 FLAIR hyperintense foci are present in supratentorial white matter and brainstem, probably represent chronic microvascular ischemic changes given risk factors. Sequelae of demyelination considered less likely. Electronically Signed   By: Kristine Garbe M.D.   On: 10/26/2017 20:47   Mr Jodene Nam Head Wo Contrast  Result Date: 10/27/2017 CLINICAL DATA:  Photophobia and headache. EXAM: MRA HEAD WITHOUT CONTRAST TECHNIQUE: Angiographic images of the Circle of Willis were obtained  using MRA technique without intravenous contrast. COMPARISON:  Brain MRI and CTA head neck 10/26/2017 FINDINGS: Intracranial internal carotid arteries: Normal Anterior cerebral arteries: Normal. Middle cerebral arteries: Normal. Posterior communicating arteries: Present bilaterally. Posterior cerebral arteries: Normal. Basilar artery: Normal. Vertebral arteries: Left dominant. Normal. Superior cerebellar arteries: Normal. Anterior inferior cerebellar arteries: Normal. Posterior inferior cerebellar arteries: Normal. IMPRESSION: Normal intracranial MRA. Electronically Signed   By: Ulyses Jarred M.D.   On: 10/27/2017 06:45    Medications:  Scheduled: . atorvastatin  80 mg Oral q1800  . cholecalciferol  1,000 Units Oral Daily  . clopidogrel  75 mg Oral Daily  . docusate sodium  100 mg Oral BID  . enoxaparin (LOVENOX) injection  40 mg Subcutaneous Q24H  . letrozole  2.5 mg Oral Daily  . levETIRAcetam  500 mg Oral BID  . loratadine  10 mg Oral Daily  . PARoxetine  20 mg Oral BH-q7a   Continuous: . sodium chloride Stopped (10/27/17 1900)    EEG 3/15: Description: The patient is awake and asleep during the recording.  During maximal wakefulness, there is a symmetric, medium voltage 10 Hz posterior dominant rhythm that attenuates with eye opening.  The record is symmetric.  During drowsiness and sleep, there is an increase in theta slowing of the background.  Vertex waves and symmetric sleep spindles were seen.  Hyperventilation and photic stimulation did not elicit any abnormalities.  Intermittent left frontotemporal spike and wave discharges were seen with mild spread to the right frontal region.  No electrographic seizures were seen. EKG lead was unremarkable. Impression: This awake and asleep EEG is abnormal due to left frontotemporal spike and wave discharges.  Clinical Correlation: The above findings indicate cortical irritation with increased risk of seizures from this region.   Assessment: 57  y.o. female with a past medical history of breast cancer on Femara, hypertension, dyslipidemia migraines on Fioricet who presents the ED for evaluation of tongue bite overnight, unsteady gait, confusion with expressive aphasia which she noticed upon waking up this morning.  1. EEG came back abnormal with left frontotemporal spike and wave discharges, consistent with cortical irritation and increased risk for seizures. The event during her sleep resulting in the tongue-bite most likely was a seizure. Deficits described upon awakening are now resolved and most likely represented a postictal state.  2. MRI brain: No acute intracranial abnormality identified. No structural cause of seizure identified. Nonspecific T2 FLAIR hyperintense foci are present in supratentorial white matter and brainstem, probably representing chronic microvascular ischemic changes given risk factors (hypertension, dyslipidemia, tobacco use). 3. Breast cancer -  On Femara  Plan: 1. Continue Keppra 500 mg po BID 2. Discussed seizure precautions with patient. Per [facility] statutes, patients with seizures are not allowed to drive until  they have been seizure-free for six months. Use caution when using heavy equipment or power tools. Avoid working on ladders or at heights. Take showers instead of baths. Ensure the water temperature is not too high on the home water heater. Do  not go swimming alone. When caring for infants or small children, sit down when holding, feeding, or changing them to minimize risk of injury to the child in the event you have a seizure. Also, Maintain good sleep hygiene. Avoid alcohol. 3. Will need outpatient Neurology follow up after discharge.     LOS: 2 days   '@Electronically'  signed: Dr. Kerney Elbe 10/28/2017  7:26 AM

## 2017-10-28 NOTE — Progress Notes (Signed)
Echocardiogram 2D Echocardiogram has been performed.  Eileen Holland 10/28/2017, 10:18 AM

## 2017-10-28 NOTE — Discharge Summary (Signed)
Physician Discharge Summary  Eileen Holland HXT:[id number] DOB: April 21, 1961 DOA: 10/26/2017  PCP: Eileen Mires, MD  Admit date: 10/26/2017 Discharge date: 10/28/2017  Admitted From: Home Disposition: Home  Recommendations for Outpatient Follow-up:  1. Follow up with PCP in 1 week 2. Follow-up with neurology in 1-2 weeks 3. Patient was advised not to drive at least for 67-months seizure-free duration and only once cleared by primary care provider and/or neurology   Home Health: No Equipment/Devices: None  Discharge Condition: Stable CODE STATUS: Full Diet recommendation: Heart Healthy   Brief/Interim Summary: 57 year old female with history of breast cancer on Femara, hypertension, dyslipidemia presented on 10/26/2017 with confusion, some expressive aphasia, unsteady gait and tongue bite.  Neurology was consulted.  Initial CT angiogram of the head and neck was unremarkable.  MRI of the brain was negative for stroke.  EEG was abnormal so she was started on Keppra by neurology.  She will be discharged on Keppra.  Neurology has cleared the patient for discharge    Discharge Diagnoses:  Principal Problem:   Acute CVA (cerebrovascular accident) ([place]) Active Problems:   HTN (hypertension)   HLD (hyperlipidemia)   Major depression in remission ([place])   Headache, migraine   Seizure ([place])  Possible seizure presenting with tongue bite/expressive aphasia and confusion -Neurology following.  CT scan of the brain was negative for acute intracranial abnormality. MRI of the brain was negative for stroke -EEG was abnormal.  She was started on Keppra by neurology.  She will be discharged on Keppra.  Neurology has cleared the patient for discharge.  No overnight seizure activities -Mental status much improved -Patient was advised not to drive at least for 2-months seizure-free duration and only once cleared by primary care provider and/or neurology -Outpatient follow-up with  neurology   Hypertension:  Continue home regimen  Dyslipidemia: Continue statin  Breast cancer:  Continue Femara  Mild hypokalemia:  Improved     Discharge Instructions  Discharge Instructions    Ambulatory referral to Neurology   Complete by:  As directed    An appointment is requested in approximately:2 weeks; new onset seizures   Call MD for:  difficulty breathing, headache or visual disturbances   Complete by:  As directed    Call MD for:  extreme fatigue   Complete by:  As directed    Call MD for:  hives   Complete by:  As directed    Call MD for:  persistant dizziness or light-headedness   Complete by:  As directed    Call MD for:  persistant nausea and vomiting   Complete by:  As directed    Call MD for:  severe uncontrolled pain   Complete by:  As directed    Call MD for:  temperature >100.4   Complete by:  As directed    Diet - low sodium heart healthy   Complete by:  As directed    Increase activity slowly   Complete by:  As directed      Allergies as of 10/28/2017      Reactions   Aspirin Hives   Ibuprofen Hives      Medication List    TAKE these medications   ACETAMINOPHEN-BUTALBITAL 50-325 MG Tabs Take 1 tablet by mouth every 4 (four) hours as needed (headache.).   amLODipine 10 MG tablet Commonly known as:  NORVASC Take 10 mg by mouth every evening. @@7 :30 pm   B COMPLEX PO Take 1 tablet by mouth daily at 12 noon. (B  complex-C-E-zinc)   docusate sodium 100 MG capsule Commonly known as:  COLACE Take 100 mg by mouth 2 (two) times daily.   hydrochlorothiazide 25 MG tablet Commonly known as:  HYDRODIURIL Take 25 mg by mouth every morning.   ketoconazole 2 % cream Commonly known as:  NIZORAL Apply 1 application topically daily as needed for irritation.   letrozole 2.5 MG tablet Commonly known as:  FEMARA TAKE 1 TABLET BY MOUTH EVERY DAY   levETIRAcetam 500 MG tablet Commonly known as:  KEPPRA Take 1 tablet (500 mg total) by  mouth 2 (two) times daily.   levocetirizine 5 MG tablet Commonly known as:  XYZAL Take 5 mg by mouth daily at 12 noon. For allergies   losartan 100 MG tablet Commonly known as:  COZAAR Take 100 mg by mouth every morning.   metoprolol succinate 100 MG 24 hr tablet Commonly known as:  TOPROL-XL Take 100 mg by mouth every morning. Take with or immediately following a meal.   PARoxetine 20 MG tablet Commonly known as:  PAXIL Take 20 mg by mouth every morning.   pravastatin 20 MG tablet Commonly known as:  PRAVACHOL Take 20 mg by mouth daily.   PRENATAL PO Take 1 tablet by mouth daily at 12 noon.   VITAMIN D HIGH POTENCY 1000 units capsule Generic drug:  Cholecalciferol Take 1,000 Units daily by mouth.      Follow-up Information    Eileen Mires, MD. Schedule an appointment as soon as possible for a visit in 1 week(s).   Specialty:  Family Medicine Contact information: [address] [place] [address] [phone]          Allergies  Allergen Reactions  . Aspirin Hives  . Ibuprofen Hives    Consultations:  Neurology   Procedures/Studies: Ct Angio Head W Or Wo Contrast  Result Date: 10/26/2017 CLINICAL DATA:  Awoke with pain in the tongue and gait disturbance. Slurred speech. Expressive aphasia. EXAM: CT ANGIOGRAPHY HEAD AND NECK TECHNIQUE: Multidetector CT imaging of the head and neck was performed using the standard protocol during bolus administration of intravenous contrast. Multiplanar CT image reconstructions and MIPs were obtained to evaluate the vascular anatomy. Carotid stenosis measurements (when applicable) are obtained utilizing NASCET criteria, using the distal internal carotid diameter as the denominator. CONTRAST:  130mL ISOVUE-370 IOPAMIDOL (ISOVUE-370) INJECTION 76% COMPARISON:  None. FINDINGS: CT HEAD FINDINGS Brain: The brain shows a normal appearance without evidence of malformation, atrophy, old or acute small or large vessel infarction, mass  lesion, hemorrhage, hydrocephalus or extra-axial collection. Vascular: No hyperdense vessel. No evidence of atherosclerotic calcification. Skull: Normal.  No traumatic finding.  No focal bone lesion. Sinuses/Orbits: Sinuses are clear. Orbits appear normal. Mastoids are clear. Other: None significant CTA NECK FINDINGS Aortic arch: Negative Right carotid system: Common carotid artery widely patent to the bifurcation region. Carotid bifurcation normal without soft or calcified plaque. Cervical ICA is tortuous but normal. Left carotid system: Left common carotid artery widely patent to the bifurcation region. Carotid bifurcation is normal without soft or calcified plaque. Cervical ICA is tortuous but widely patent. Vertebral arteries: Both vertebral artery origins are widely patent. The vertebral arteries are approximately equal in size and widely patent through the cervical region to the foramen magnum. The vessels are quite tortuous. Skeleton: Ordinary mid cervical spondylosis. Other neck: No mass or lymphadenopathy. Upper chest: Negative Review of the MIP images confirms the above findings CTA HEAD FINDINGS Anterior circulation: Both internal carotid arteries are patent through  the skull base and siphon regions. Minimal atherosclerotic calcification in the carotid siphons but no stenosis. The anterior and middle cerebral vessels are patent without proximal stenosis, aneurysm or vascular malformation. No missing branch vessels identified. Fetal origin of both posterior cerebral arteries. Posterior circulation: Both vertebral arteries are patent through the foramen magnum to the basilar. The vertebrobasilar system is small, because of primary fetal origin of the posterior cerebral arteries. No posterior circulation branch vessel abnormality is seen. Venous sinuses: Patent and normal. Anatomic variants: None other. Delayed phase: Abnormal enhancement. Review of the MIP images confirms the above findings IMPRESSION:  Normal appearance of the brain. No visible stroke. No sign of metastatic disease. Negative CT angiography. No evidence of atherosclerotic disease. No large or medium vessel occlusion. Electronically Signed   By: Nelson Chimes M.D.   On: 10/26/2017 15:41   Dg Chest 2 View  Result Date: 10/26/2017 CLINICAL DATA:  Possible seizure. EXAM: CHEST - 2 VIEW COMPARISON:  09/07/2015 FINDINGS: The cardiac silhouette is mildly enlarged. The lungs are normally to mildly hyperinflated. No airspace consolidation, edema, pleural effusion, pneumothorax is identified. No acute osseous abnormality is seen. IMPRESSION: No active cardiopulmonary disease. Electronically Signed   By: Logan Bores M.D.   On: 10/26/2017 15:03   Ct Angio Neck W And/or Wo Contrast  Result Date: 10/26/2017 CLINICAL DATA:  Awoke with pain in the tongue and gait disturbance. Slurred speech. Expressive aphasia. EXAM: CT ANGIOGRAPHY HEAD AND NECK TECHNIQUE: Multidetector CT imaging of the head and neck was performed using the standard protocol during bolus administration of intravenous contrast. Multiplanar CT image reconstructions and MIPs were obtained to evaluate the vascular anatomy. Carotid stenosis measurements (when applicable) are obtained utilizing NASCET criteria, using the distal internal carotid diameter as the denominator. CONTRAST:  16mL ISOVUE-370 IOPAMIDOL (ISOVUE-370) INJECTION 76% COMPARISON:  None. FINDINGS: CT HEAD FINDINGS Brain: The brain shows a normal appearance without evidence of malformation, atrophy, old or acute small or large vessel infarction, mass lesion, hemorrhage, hydrocephalus or extra-axial collection. Vascular: No hyperdense vessel. No evidence of atherosclerotic calcification. Skull: Normal.  No traumatic finding.  No focal bone lesion. Sinuses/Orbits: Sinuses are clear. Orbits appear normal. Mastoids are clear. Other: None significant CTA NECK FINDINGS Aortic arch: Negative Right carotid system: Common carotid artery  widely patent to the bifurcation region. Carotid bifurcation normal without soft or calcified plaque. Cervical ICA is tortuous but normal. Left carotid system: Left common carotid artery widely patent to the bifurcation region. Carotid bifurcation is normal without soft or calcified plaque. Cervical ICA is tortuous but widely patent. Vertebral arteries: Both vertebral artery origins are widely patent. The vertebral arteries are approximately equal in size and widely patent through the cervical region to the foramen magnum. The vessels are quite tortuous. Skeleton: Ordinary mid cervical spondylosis. Other neck: No mass or lymphadenopathy. Upper chest: Negative Review of the MIP images confirms the above findings CTA HEAD FINDINGS Anterior circulation: Both internal carotid arteries are patent through the skull base and siphon regions. Minimal atherosclerotic calcification in the carotid siphons but no stenosis. The anterior and middle cerebral vessels are patent without proximal stenosis, aneurysm or vascular malformation. No missing branch vessels identified. Fetal origin of both posterior cerebral arteries. Posterior circulation: Both vertebral arteries are patent through the foramen magnum to the basilar. The vertebrobasilar system is small, because of primary fetal origin of the posterior cerebral arteries. No posterior circulation branch vessel abnormality is seen. Venous sinuses: Patent and normal. Anatomic variants: None other. Delayed phase:  Abnormal enhancement. Review of the MIP images confirms the above findings IMPRESSION: Normal appearance of the brain. No visible stroke. No sign of metastatic disease. Negative CT angiography. No evidence of atherosclerotic disease. No large or medium vessel occlusion. Electronically Signed   By: Nelson Chimes M.D.   On: 10/26/2017 15:41   Mr Brain Wo Contrast  Result Date: 10/26/2017 CLINICAL DATA:  57 y/o F; photophobia, tongue bite, headache, unsteady gait,  lightheadedness. History of breast cancer. EXAM: MRI HEAD WITHOUT CONTRAST TECHNIQUE: Multiplanar, multiecho pulse sequences of the brain and surrounding structures were obtained without intravenous contrast. COMPARISON:  10/26/2017 CT angiogram of the head. FINDINGS: Brain: No acute infarction, hemorrhage, hydrocephalus, extra-axial collection or mass lesion. No disorder cortical formation, gray matter heterotopia, or cortical dysplasia identified. Hippocampi are symmetric in size and signal. Several foci of T2 FLAIR hyperintense signal abnormality are present within subcortical and periventricular white matter as well as the pons. No lesion is identified within corpus callosum, basal ganglia, or cerebellum. Vascular: Normal flow voids. Skull and upper cervical spine: Normal marrow signal. Sinuses/Orbits: Negative. Other: None. IMPRESSION: 1. No acute intracranial abnormality identified. 2. No structural cause of seizure. 3. Nonspecific T2 FLAIR hyperintense foci are present in supratentorial white matter and brainstem, probably represent chronic microvascular ischemic changes given risk factors. Sequelae of demyelination considered less likely. Electronically Signed   By: Kristine Garbe M.D.   On: 10/26/2017 20:47   Mr Jodene Nam Head Wo Contrast  Result Date: 10/27/2017 CLINICAL DATA:  Photophobia and headache. EXAM: MRA HEAD WITHOUT CONTRAST TECHNIQUE: Angiographic images of the Circle of Willis were obtained using MRA technique without intravenous contrast. COMPARISON:  Brain MRI and CTA head neck 10/26/2017 FINDINGS: Intracranial internal carotid arteries: Normal Anterior cerebral arteries: Normal. Middle cerebral arteries: Normal. Posterior communicating arteries: Present bilaterally. Posterior cerebral arteries: Normal. Basilar artery: Normal. Vertebral arteries: Left dominant. Normal. Superior cerebellar arteries: Normal. Anterior inferior cerebellar arteries: Normal. Posterior inferior cerebellar  arteries: Normal. IMPRESSION: Normal intracranial MRA. Electronically Signed   By: Ulyses Jarred M.D.   On: 10/27/2017 06:45    EEG on 10/27/2017 Description: The patient is awake and asleep during the recording.  During maximal wakefulness, there is a symmetric, medium voltage 10 Hz posterior dominant rhythm that attenuates with eye opening.  The record is symmetric.  During drowsiness and sleep, there is an increase in theta slowing of the background.  Vertex waves and symmetric sleep spindles were seen.  Hyperventilation and photic stimulation did not elicit any abnormalities.  Intermittent left frontotemporal spike and wave discharges were seen with mild spread to the right frontal region.  No electrographic seizures were seen.  EKG lead was unremarkable.  Impression: This awake and asleep EEG is abnormal due to left frontotemporal spike and wave discharges.  Clinical Correlation: The above findings indicate cortical irritation with increased risk of seizures from this region.      Subjective: Patient seen and examined at bedside.  Denies overnight fever, nausea, vomiting, headache or seizures.  Discharge Exam: Vitals:   10/28/17 0434 10/28/17 0829  BP: 128/67 (!) 155/89  Pulse: 88 66  Resp: 16 17  Temp: 98.1 F (36.7 C) 98.3 F (36.8 C)  SpO2: 100% 97%   Vitals:   10/27/17 2019 10/27/17 2348 10/28/17 0434 10/28/17 0829  BP: 119/70 123/69 128/67 (!) 155/89  Pulse: 66 63 88 66  Resp: 18 16 16 17   Temp: 99.2 F (37.3 C) 97.8 F (36.6 C) 98.1 F (36.7 C) 98.3 F (36.8  C)  TempSrc: Oral Oral Oral Oral  SpO2: 100% 100% 100% 97%  Weight:      Height:        General: Pt is alert, awake, not in acute distress Cardiovascular: Rate controlled, S1/S2 + Respiratory: Bilateral decreased breath sounds at bases Abdominal: Soft, NT, ND, bowel sounds + Extremities: no edema, no cyanosis    The results of significant diagnostics from this hospitalization (including  imaging, microbiology, ancillary and laboratory) are listed below for reference.     Microbiology: No results found for this or any previous visit (from the past 240 hour(s)).   Labs: BNP (last 3 results) No results for input(s): BNP in the last 8760 hours. Basic Metabolic Panel: Recent Labs  Lab 10/26/17 1425 10/26/17 1432 10/26/17 2114 10/28/17 0409  NA 137 138  --  141  K 3.2* 3.2*  --  3.8  CL 99* 99*  --  105  CO2 28  --   --  25  GLUCOSE 90 85  --  91  BUN 8 7  --  11  CREATININE 0.79 0.80 0.82 0.84  CALCIUM 9.8  --   --  9.3  MG  --   --   --  1.9   Liver Function Tests: Recent Labs  Lab 10/26/17 1425  AST 37  ALT 22  ALKPHOS 67  BILITOT 0.7  PROT 8.1  ALBUMIN 4.3   No results for input(s): LIPASE, AMYLASE in the last 168 hours. No results for input(s): AMMONIA in the last 168 hours. CBC: Recent Labs  Lab 10/26/17 1425 10/26/17 1432 10/26/17 2114 10/28/17 0409  WBC 13.6*  --  12.7* 7.8  NEUTROABS 11.1*  --   --  5.1  HGB 13.1 13.6 11.9* 12.7  HCT 37.4 40.0 35.8* 38.4  MCV 103.0*  --  102.0* 104.3*  PLT 239  --  218 226   Cardiac Enzymes: No results for input(s): CKTOTAL, CKMB, CKMBINDEX, TROPONINI in the last 168 hours. BNP: Invalid input(s): POCBNP CBG: No results for input(s): GLUCAP in the last 168 hours. D-Dimer No results for input(s): DDIMER in the last 72 hours. Hgb A1c Recent Labs    10/27/17 0430  HGBA1C 4.7*   Lipid Profile Recent Labs    10/27/17 0430  CHOL 173  HDL 61  LDLCALC 86  TRIG 132  CHOLHDL 2.8   Thyroid function studies No results for input(s): TSH, T4TOTAL, T3FREE, THYROIDAB in the last 72 hours.  Invalid input(s): FREET3 Anemia work up No results for input(s): VITAMINB12, FOLATE, FERRITIN, TIBC, IRON, RETICCTPCT in the last 72 hours. Urinalysis    Component Value Date/Time   COLORURINE YELLOW 10/26/2017 1505   APPEARANCEUR CLEAR 10/26/2017 1505   LABSPEC 1.004 (L) 10/26/2017 1505   PHURINE 7.0  10/26/2017 1505   GLUCOSEU NEGATIVE 10/26/2017 1505   HGBUR MODERATE (A) 10/26/2017 1505   BILIRUBINUR NEGATIVE 10/26/2017 1505   KETONESUR NEGATIVE 10/26/2017 1505   PROTEINUR NEGATIVE 10/26/2017 1505   NITRITE NEGATIVE 10/26/2017 1505   LEUKOCYTESUR TRACE (A) 10/26/2017 1505   Sepsis Labs Invalid input(s): PROCALCITONIN,  WBC,  LACTICIDVEN Microbiology No results found for this or any previous visit (from the past 240 hour(s)).   Time coordinating discharge: 35 minutes  SIGNED:   Aline August, MD  [facility] 10/28/2017, 8:44 AM Pager: [phone]  If 7PM-7AM, please contact night-coverage [email or web address] Password [email or web address]

## 2017-10-28 NOTE — Progress Notes (Signed)
Patient ready for discharge to home; discharge instructions given and reviewed; Rx sent electronically; patient given letter for her employer; patient discharged out via wheelchair; accompanied by her daughter.

## 2017-11-01 ENCOUNTER — Telehealth: Payer: Self-pay | Admitting: Diagnostic Neuroimaging

## 2017-11-01 ENCOUNTER — Ambulatory Visit: Payer: Managed Care, Other (non HMO) | Admitting: Diagnostic Neuroimaging

## 2017-11-01 ENCOUNTER — Encounter: Payer: Self-pay | Admitting: Diagnostic Neuroimaging

## 2017-11-01 VITALS — BP 132/90 | HR 78 | Ht 62.0 in | Wt 155.0 lb

## 2017-11-01 DIAGNOSIS — G40909 Epilepsy, unspecified, not intractable, without status epilepticus: Secondary | ICD-10-CM | POA: Diagnosis not present

## 2017-11-01 DIAGNOSIS — R0683 Snoring: Secondary | ICD-10-CM | POA: Diagnosis not present

## 2017-11-01 MED ORDER — LEVETIRACETAM 500 MG PO TABS: 500.0000 mg | ORAL_TABLET | Freq: Two times a day (BID) | ORAL | 4 refills | Status: DC

## 2017-11-01 NOTE — Telephone Encounter (Signed)
3/20 Pt came in for appt and Dr. Leta Baptist requested a 6 month follow up at check out, pt stated that she would like to wait and call us back to schedule so that she can schedule transportation arrangement with her daughter.

## 2017-11-01 NOTE — Patient Instructions (Signed)
SEIZURE DISORDER - continue levetiracetam 500mg  twice a day   - According to [place] law, you can not drive unless you are seizure / syncope free for at least 6 months and under physician's care.   - Please maintain precautions. Do not participate in activities where a loss of awareness could harm you or someone else. No swimming alone, no tub bathing, no hot tubs, no driving, no operating motorized vehicles (cars, ATVs, motocycles, etc), lawnmowers, power tools or firearms. No standing at heights, such as rooftops, ladders or stairs. Avoid hot objects such as stoves, heaters, open fires. Wear a helmet when riding a bicycle, scooter, skateboard, etc. and avoid areas of traffic. Set your water heater to 120 degrees or less.   SNORING/DAYTIME FATIGUE - check sleep study consult

## 2017-11-01 NOTE — Progress Notes (Signed)
[facility]  PATIENT: Eileen Holland DOB: 1960-12-08  REFERRING CLINICIAN:  Starla Link, K HISTORY FROM: patient and sister and chart review  REASON FOR VISIT: new consult    HISTORICAL  CHIEF COMPLAINT:  Chief Complaint  Patient presents with  . NP  [facility] Referral  . New Onset Seizures    Had sz, seen in ED 10/26/2017. No sz since.  Taking keppra (doing well no SE).   Charlett Blake sister here with pt.    HISTORY OF PRESENT ILLNESS:   57 year old female with history of breast cancer, hypertension, hyperlipidemia, presented to hospital on 10/26/17 with confusion, excessive aphasia, tongue bite.  Patient may have had the seizure the night before in her sleep.  Patient was admitted for workup.  MRI of the brain, CTA of the head and neck were unremarkable.  EEG showed left frontotemporal epileptiform discharges.  Patient was started on levetiracetam 500 mg twice a day.  Since that time patient has done well.  No further events.  No prior history of seizures.  No prior history of head trauma, meningitis or encephalitis.  No family history of seizure.   REVIEW OF SYSTEMS: Full 14 system review of systems performed and negative with exception of: Snoring constipation allergies.  ALLERGIES: Allergies  Allergen Reactions  . Aspirin Hives  . Ibuprofen Hives    HOME MEDICATIONS: Outpatient Medications Prior to Visit  Medication Sig Dispense Refill  . ACETAMINOPHEN-BUTALBITAL 50-325 MG TABS Take 1 tablet by mouth every 4 (four) hours as needed (headache.).    Marland Kitchen amLODipine (NORVASC) 10 MG tablet Take 10 mg by mouth every evening. @'@7' :30 pm    . B Complex Vitamins (B COMPLEX PO) Take 1 tablet by mouth daily at 12 noon. (B complex-C-E-zinc)    . Cholecalciferol (VITAMIN D HIGH POTENCY) 1000 units capsule Take 1,000 Units daily by mouth.     . docusate sodium (COLACE) 100 MG capsule Take 100 mg by mouth 2 (two) times daily.    . hydrochlorothiazide 25 MG tablet Take 25 mg by mouth every  morning.     Marland Kitchen ketoconazole (NIZORAL) 2 % cream Apply 1 application topically daily as needed for irritation.    Marland Kitchen letrozole (FEMARA) 2.5 MG tablet TAKE 1 TABLET BY MOUTH EVERY DAY 90 tablet 0  . levETIRAcetam (KEPPRA) 500 MG tablet Take 1 tablet (500 mg total) by mouth 2 (two) times daily. 60 tablet 0  . levocetirizine (XYZAL) 5 MG tablet Take 5 mg by mouth daily at 12 noon. For allergies     . losartan (COZAAR) 100 MG tablet Take 100 mg by mouth every morning.    . metoprolol succinate (TOPROL-XL) 100 MG 24 hr tablet Take 100 mg by mouth every morning. Take with or immediately following a meal.    . PARoxetine (PAXIL) 20 MG tablet Take 20 mg by mouth every morning.      . pravastatin (PRAVACHOL) 20 MG tablet Take 20 mg by mouth daily.     . Prenatal Vit-Fe Fumarate-FA (PRENATAL PO) Take 1 tablet by mouth daily at 12 noon.     No facility-administered medications prior to visit.     PAST MEDICAL HISTORY: Past Medical History:  Diagnosis Date  . Allergy   . Anxiety    panic attacks per PCP history  . Breast calcification, right   . Cancer ([place])    right BR CA  with lumpectomy with radiation, no chemo - radiation in 2016  . Constipation    chronic- use  stool softener and OTC fiber and prn miralax   . Depression   . Headache    hx of per PCP history  . Headache, migraine 07/04/2014   Last Assessment & Plan:  Refilled butalbital for rare use, once monthly as cannot take NSAIDs.  Advised if needs it more regularly, to follow-up and discuss management furtehr   . High cholesterol   . Hyperlipidemia   . Hypertension   . Personal history of radiation therapy 2016  . Radiation 07/13/15-08/03/15   right breast 4256 cGy    PAST SURGICAL HISTORY: Past Surgical History:  Procedure Laterality Date  . BREAST BIOPSY Right 04/28/2015  . BREAST LUMPECTOMY Right 06/09/2015  . BREAST LUMPECTOMY WITH RADIOACTIVE SEED LOCALIZATION Right 06/09/2015   Procedure: RIGHT BREAST LUMPECTOMY WITH  RADIOACTIVE SEED LOCALIZATION;  Surgeon: Erroll Luna, MD;  Location: [facility];  Service: General;  Laterality: Right;  . COLON SURGERY  07/17/2014  . COLONOSCOPY    . colonscopy     . KNEE ARTHROSCOPY Right 2000  . POLYPECTOMY  07/17/2014   polyp removed surgicallty   . VAGINAL HYSTERECTOMY  2012    FAMILY HISTORY: Family History  Problem Relation Age of Onset  . Lung cancer Father 80       former smoker  . Colon polyps Father        unspecified number  . Breast cancer Sister 63       negative BRCA testing in 2007  . Stomach cancer Maternal Uncle        dx. 50s-early 11s  . Heart attack Paternal Uncle   . Colon cancer Neg Hx   . Esophageal cancer Neg Hx   . Rectal cancer Neg Hx     SOCIAL HISTORY:  Social History   Socioeconomic History  . Marital status: Single    Spouse name: Not on file  . Number of children: Not on file  . Years of education: Not on file  . Highest education level: Not on file  Social Needs  . Financial resource strain: Not on file  . Food insecurity - worry: Not on file  . Food insecurity - inability: Not on file  . Transportation needs - medical: Not on file  . Transportation needs - non-medical: Not on file  Occupational History  . Not on file  Tobacco Use  . Smoking status: Current Every Day Smoker    Packs/day: 0.50    Years: 32.00    Pack years: 16.00    Types: Cigarettes  . Smokeless tobacco: Never Used  Substance and Sexual Activity  . Alcohol use: Yes    Alcohol/week: 0.0 oz    Comment: occas - maybe 1 drink every 3 mos  . Drug use: No  . Sexual activity: Not on file  Other Topics Concern  . Not on file  Social History Narrative  . Not on file     PHYSICAL EXAM  GENERAL EXAM/CONSTITUTIONAL: Vitals:  Vitals:   11/01/17 1111  BP: 132/90  Pulse: 78  Weight: 155 lb (70.3 kg)  Height: '5\' 2"'  (1.575 m)     Body mass index is 28.35 kg/m.  Visual Acuity Screening   Right eye Left eye Both eyes    Without correction:     With correction: 20/30 20/30      Patient is in no distress; well developed, nourished and groomed; neck is supple  CARDIOVASCULAR:  Examination of carotid arteries is normal; no carotid bruits  Regular rate and  rhythm, no murmurs  Examination of peripheral vascular system by observation and palpation is normal  EYES:  Ophthalmoscopic exam of optic discs and posterior segments is normal; no papilledema or hemorrhages  MUSCULOSKELETAL:  Gait, strength, tone, movements noted in Neurologic exam below  NEUROLOGIC: MENTAL STATUS:  No flowsheet data found.  awake, alert, oriented to person, place and time  recent and remote memory intact  normal attention and concentration  language fluent, comprehension intact, naming intact,   fund of knowledge appropriate  CRANIAL NERVE:   2nd - no papilledema on fundoscopic exam  2nd, 3rd, 4th, 6th - pupils equal and reactive to light, visual fields full to confrontation, extraocular muscles intact, no nystagmus  5th - facial sensation symmetric  7th - facial strength symmetric  8th - hearing intact  9th - palate elevates symmetrically, uvula midline  11th - shoulder shrug symmetric  12th - tongue protrusion midline  HEALING LACERATION TO RIGHT LATERAL TONGUE  MOTOR:   normal bulk and tone, full strength in the BUE, BLE  SENSORY:   normal and symmetric to light touch, temperature, vibration  COORDINATION:   finger-nose-finger, fine finger movements normal  REFLEXES:   deep tendon reflexes present and symmetric  GAIT/STATION:   narrow based gait; able to walk tandem    DIAGNOSTIC DATA (LABS, IMAGING, TESTING) - I reviewed patient records, labs, notes, testing and imaging myself where available.  Lab Results  Component Value Date   WBC 7.8 10/28/2017   HGB 12.7 10/28/2017   HCT 38.4 10/28/2017   MCV 104.3 (H) 10/28/2017   PLT 226 10/28/2017      Component Value Date/Time    NA 141 10/28/2017 0409   NA 139 03/28/2017 1034   NA 138 09/27/2016 0820   K 3.8 10/28/2017 0409   K 4.0 03/28/2017 1034   K 3.5 09/27/2016 0820   CL 105 10/28/2017 0409   CL 103 03/28/2017 1034   CO2 25 10/28/2017 0409   CO2 30 03/28/2017 1034   CO2 27 09/27/2016 0820   GLUCOSE 91 10/28/2017 0409   GLUCOSE 110 03/28/2017 1034   BUN 11 10/28/2017 0409   BUN 11 03/28/2017 1034   BUN 8.6 09/27/2016 0820   CREATININE 0.84 10/28/2017 0409   CREATININE 1.1 03/28/2017 1034   CREATININE 0.9 09/27/2016 0820   CALCIUM 9.3 10/28/2017 0409   CALCIUM 9.8 03/28/2017 1034   CALCIUM 9.8 09/27/2016 0820   PROT 8.1 10/26/2017 1425   PROT 8.0 03/28/2017 1034   PROT 7.6 09/27/2016 0820   ALBUMIN 4.3 10/26/2017 1425   ALBUMIN 3.8 03/28/2017 1034   ALBUMIN 3.8 09/27/2016 0820   AST 37 10/26/2017 1425   AST 25 03/28/2017 1034   AST 20 09/27/2016 0820   ALT 22 10/26/2017 1425   ALT 21 03/28/2017 1034   ALT 16 09/27/2016 0820   ALKPHOS 67 10/26/2017 1425   ALKPHOS 81 03/28/2017 1034   ALKPHOS 80 09/27/2016 0820   BILITOT 0.7 10/26/2017 1425   BILITOT 0.70 03/28/2017 1034   BILITOT 0.43 09/27/2016 0820   GFRNONAA >60 10/28/2017 0409   GFRAA >60 10/28/2017 0409   Lab Results  Component Value Date   CHOL 173 10/27/2017   HDL 61 10/27/2017   LDLCALC 86 10/27/2017   TRIG 132 10/27/2017   CHOLHDL 2.8 10/27/2017   Lab Results  Component Value Date   HGBA1C 4.7 (L) 10/27/2017   No results found for: VITAMINB12 No results found for: TSH   10/27/17 EEG - This  awake andasleepEEG is abnormal due to left frontotemporal spike and wave discharges.   10/26/17 MRI brain 1. No acute intracranial abnormality identified. 2. No structural cause of seizure. 3. Nonspecific T2 FLAIR hyperintense foci are present in supratentorial white matter and brainstem, probably represent chronic microvascular ischemic changes given risk factors. Sequelae of demyelination considered less likely.  10/26/17  CTA head / neck - Normal appearance of the brain. No visible stroke. No sign of metastatic disease. - Negative CT angiography. No evidence of atherosclerotic disease. No large or medium vessel occlusion.     ASSESSMENT AND PLAN  57 y.o. year old female here with new onset seizure on 10/26/17, with abnormal EEG, now on levetiracetam 500 mg twice a day.   Dx:  1. Seizure disorder ([place])   2. Snoring      PLAN:  SEIZURE DISORDER (localization related, idiopathic) - continue levetiracetam 554m twice a day   - According to [place] law, you can not drive unless you are seizure / syncope free for at least 6 months and under physician's care.   - Please maintain precautions. Do not participate in activities where a loss of awareness could harm you or someone else. No swimming alone, no tub bathing, no hot tubs, no driving, no operating motorized vehicles (cars, ATVs, motocycles, etc), lawnmowers, power tools or firearms. No standing at heights, such as rooftops, ladders or stairs. Avoid hot objects such as stoves, heaters, open fires. Wear a helmet when riding a bicycle, scooter, skateboard, etc. and avoid areas of traffic. Set your water heater to 120 degrees or less.   SNORING/DAYTIME FATIGUE (excessive daytime fatigue, witness apnea, snoring) - check sleep study for OSA   Orders Placed This Encounter  Procedures  . Ambulatory referral to Sleep Studies   Meds ordered this encounter  Medications  . levETIRAcetam (KEPPRA) 500 MG tablet    Sig: Take 1 tablet (500 mg total) by mouth 2 (two) times daily.    Dispense:  180 tablet    Refill:  4   Return in about 6 months (around 05/04/2018).  I reviewed images, labs, notes, records myself. I summarized findings and reviewed with patient, for this high risk condition (seizure disorder) requiring high complexity decision making.    VPenni Bombard MD 33/64/6803 121:22AM Certified in Neurology, Neurophysiology and  Neuroimaging  [facility] [address] [place] [phone]

## 2017-11-05 ENCOUNTER — Encounter: Payer: Self-pay | Admitting: Diagnostic Neuroimaging

## 2017-11-07 ENCOUNTER — Encounter: Payer: Self-pay | Admitting: *Deleted

## 2017-11-07 ENCOUNTER — Telehealth: Payer: Self-pay | Admitting: Diagnostic Neuroimaging

## 2017-11-07 NOTE — Telephone Encounter (Signed)
Patient calling to discuss note. I advised waiting on a signature from Dr. Leta Baptist but she wants a returned call.

## 2017-11-07 NOTE — Telephone Encounter (Addendum)
I spoke to pt and changed the dates for her to return half days x2 starting Wednesday 3/27-28/ 2019 then fulltime. She verbalized understanding. Fax confirmation received letter to return to work.  [phone] to daughter of pt.

## 2017-12-20 ENCOUNTER — Ambulatory Visit (INDEPENDENT_AMBULATORY_CARE_PROVIDER_SITE_OTHER): Payer: Managed Care, Other (non HMO) | Admitting: Neurology

## 2017-12-20 ENCOUNTER — Encounter: Payer: Self-pay | Admitting: Neurology

## 2017-12-20 VITALS — BP 156/99 | HR 76 | Ht 62.5 in | Wt 153.0 lb

## 2017-12-20 DIAGNOSIS — G40909 Epilepsy, unspecified, not intractable, without status epilepticus: Secondary | ICD-10-CM | POA: Diagnosis not present

## 2017-12-20 DIAGNOSIS — Z820 Family history of epilepsy and other diseases of the nervous system: Secondary | ICD-10-CM | POA: Diagnosis not present

## 2017-12-20 DIAGNOSIS — G479 Sleep disorder, unspecified: Secondary | ICD-10-CM | POA: Diagnosis not present

## 2017-12-20 DIAGNOSIS — G4719 Other hypersomnia: Secondary | ICD-10-CM

## 2017-12-20 DIAGNOSIS — R0681 Apnea, not elsewhere classified: Secondary | ICD-10-CM | POA: Diagnosis not present

## 2017-12-20 DIAGNOSIS — R351 Nocturia: Secondary | ICD-10-CM | POA: Diagnosis not present

## 2017-12-20 DIAGNOSIS — R0683 Snoring: Secondary | ICD-10-CM

## 2017-12-20 NOTE — Patient Instructions (Addendum)

## 2017-12-20 NOTE — Progress Notes (Signed)
Subjective:    Patient ID: Eileen Holland is a 57 y.o. female.  HPI     Star Age, MD, PhD [facility] [address], [address]O. [place], [place] [address]  Dear Bonnita Levan,   I saw your patient, Eileen Holland, upon your kind request in my clinic today for initial consultation of her sleep disorder, in particular, concern for underlying obstructive sleep apnea. The patient is accompanied by her sister, Charlett Blake today. As you know, Ms. Hosie is a 57 year old right-handed woman with an underlying medical history of hypertension, hyperlipidemia, breast cancer, migraine headaches, depression, anxiety, recent diagnosis of seizure disorder and overweight state, who reports snoring and excessive daytime somnolence. I reviewed your office note from 11/01/2017. She reports having been told in the past that she had pauses in her breathing by her boyfriend. She also sometimes wakes up from not being able to breathe properly. She has had no significant morning headaches, she reports nocturia about once per average night. Her sister's daughter has sleep apnea unsuccessfully uses a CPAP machine. Patient smokes about half pack per day, she drinks alcohol very occasionally, caffeine in the form of coffee, at least 4 cups per day and rare tea. She lives with her daughter and daughter's 2 children who are 41 and 5. She works for [employer], , performs drug and alcohol testing. She has a TV in her bedroom and uses it at night, actually reports that she cannot sleep without it, it stays on all night, there is no pets in her bedroom. Bedtime has been between 10 and 11 but lately in the past couple of months she is trying to go to bed earlier around 9. Rise time is around 5:15 AM. She would be willing to get tested for sleep apnea and consider CPAP therapy if it is helpful to her.  Her Past Medical History Is Significant For: Past Medical History:  Diagnosis Date  . Allergy   . Anxiety     panic attacks per PCP history  . Breast calcification, right   . Cancer ([place])    right BR CA  with lumpectomy with radiation, no chemo - radiation in 2016  . Constipation    chronic- use stool softener and OTC fiber and prn miralax   . Depression   . Headache    hx of per PCP history  . Headache, migraine 07/04/2014   Last Assessment & Plan:  Refilled butalbital for rare use, once monthly as cannot take NSAIDs.  Advised if needs it more regularly, to follow-up and discuss management furtehr   . High cholesterol   . Hyperlipidemia   . Hypertension   . Personal history of radiation therapy 2016  . Radiation 07/13/15-08/03/15   right breast 4256 cGy    Her Past Surgical History Is Significant For: Past Surgical History:  Procedure Laterality Date  . BREAST BIOPSY Right 04/28/2015  . BREAST LUMPECTOMY Right 06/09/2015  . BREAST LUMPECTOMY WITH RADIOACTIVE SEED LOCALIZATION Right 06/09/2015   Procedure: RIGHT BREAST LUMPECTOMY WITH RADIOACTIVE SEED LOCALIZATION;  Surgeon: Erroll Luna, MD;  Location: [facility];  Service: General;  Laterality: Right;  . COLON SURGERY  07/17/2014  . COLONOSCOPY    . colonscopy     . KNEE ARTHROSCOPY Right 2000  . POLYPECTOMY  07/17/2014   polyp removed surgicallty   . VAGINAL HYSTERECTOMY  2012    Her Family History Is Significant For: Family History  Problem Relation Age of Onset  . Lung  cancer Father 4       former smoker  . Colon polyps Father        unspecified number  . Breast cancer Sister 8       negative BRCA testing in 2007  . Stomach cancer Maternal Uncle        dx. 50s-early 13s  . Heart attack Paternal Uncle   . Colon cancer Neg Hx   . Esophageal cancer Neg Hx   . Rectal cancer Neg Hx     Her Social History Is Significant For: Social History   Socioeconomic History  . Marital status: Single    Spouse name: Not on file  . Number of children: Not on file  . Years of education: Not on file  . Highest  education level: Not on file  Occupational History  . Not on file  Social Needs  . Financial resource strain: Not on file  . Food insecurity:    Worry: Not on file    Inability: Not on file  . Transportation needs:    Medical: Not on file    Non-medical: Not on file  Tobacco Use  . Smoking status: Current Every Day Smoker    Packs/day: 0.50    Years: 32.00    Pack years: 16.00    Types: Cigarettes  . Smokeless tobacco: Never Used  Substance and Sexual Activity  . Alcohol use: Yes    Alcohol/week: 0.0 oz    Comment: occas - maybe 1 drink every 3 mos  . Drug use: No  . Sexual activity: Not on file  Lifestyle  . Physical activity:    Days per week: Not on file    Minutes per session: Not on file  . Stress: Not on file  Relationships  . Social connections:    Talks on phone: Not on file    Gets together: Not on file    Attends religious service: Not on file    Active member of club or organization: Not on file    Attends meetings of clubs or organizations: Not on file    Relationship status: Not on file  Other Topics Concern  . Not on file  Social History Narrative  . Not on file    Her Allergies Are:  Allergies  Allergen Reactions  . Aspirin Hives  . Ibuprofen Hives  :   Her Current Medications Are:  Outpatient Encounter Medications as of 12/20/2017  Medication Sig  . ACETAMINOPHEN-BUTALBITAL 50-325 MG TABS Take 1 tablet by mouth every 4 (four) hours as needed (headache.).  Marland Kitchen amLODipine (NORVASC) 10 MG tablet Take 10 mg by mouth every evening. @_0 :30 pm  . B Complex Vitamins (B COMPLEX PO) Take 1 tablet by mouth daily at 12 noon. (B complex-C-E-zinc)  . Cholecalciferol (VITAMIN D HIGH POTENCY) 1000 units capsule Take 1,000 Units daily by mouth.   . docusate sodium (COLACE) 100 MG capsule Take 100 mg by mouth 2 (two) times daily.  . hydrochlorothiazide 25 MG tablet Take 25 mg by mouth every morning.   Marland Kitchen ketoconazole (NIZORAL) 2 % cream Apply 1 application  topically daily as needed for irritation.  Marland Kitchen letrozole (FEMARA) 2.5 MG tablet TAKE 1 TABLET BY MOUTH EVERY DAY  . levETIRAcetam (KEPPRA) 500 MG tablet Take 1 tablet (500 mg total) by mouth 2 (two) times daily.  Marland Kitchen levocetirizine (XYZAL) 5 MG tablet Take 5 mg by mouth daily at 12 noon. For allergies   . losartan (COZAAR) 100 MG tablet Take 100  mg by mouth every morning.  . metoprolol succinate (TOPROL-XL) 100 MG 24 hr tablet Take 100 mg by mouth every morning. Take with or immediately following a meal.  . PARoxetine (PAXIL) 20 MG tablet Take 20 mg by mouth every morning.    . pravastatin (PRAVACHOL) 20 MG tablet Take 20 mg by mouth daily.   . Prenatal Vit-Fe Fumarate-FA (PRENATAL PO) Take 1 tablet by mouth daily at 12 noon.   No facility-administered encounter medications on file as of 12/20/2017.   :  Review of Systems:  Out of a complete 14 point review of systems, all are reviewed and negative with the exception of these symptoms as listed below: Review of Systems  Neurological:       Pt presents today to discuss her sleep. Pt "knows that I have sleep apnea." Pt is reluctant to use a cpap. Pt has never had a sleep study.  Epworth Sleepiness Scale 0= would never doze 1= slight chance of dozing 2= moderate chance of dozing 3= high chance of dozing  Sitting and reading: 0 Watching TV: 3 Sitting inactive in a public place (ex. Theater or meeting): 3 As a passenger in a car for an hour without a break: 1 Lying down to rest in the afternoon: 3 Sitting and talking to someone: 0 Sitting quietly after lunch (no alcohol): 1 In a car, while stopped in traffic: 0 Total: 11     Objective:  Neurological Exam  Physical Exam Physical Examination:   Vitals:   12/20/17 1044  BP: (!) 156/99  Pulse: 76    General Examination: The patient is a very pleasant 57 y.o. female in no acute distress. She appears well-developed and well-nourished and well groomed.   HEENT: Normocephalic,  atraumatic, pupils are equal, round and reactive to light and accommodation. Extraocular tracking is good without limitation to gaze excursion or nystagmus noted. Normal smooth pursuit is noted. Hearing is grossly intact. Face is symmetric with normal facial animation and normal facial sensation. Speech is clear with no dysarthria noted. There is no hypophonia. There is no lip, neck/head, jaw or voice tremor. Neck is supple with full range of passive and active motion. There are no carotid bruits on auscultation. Oropharynx exam reveals: mild mouth dryness, adequate dental hygiene and moderate airway crowding, due to smaller airway entry, redundant soft palate, wider uvula and tonsils in place, tonsils are about 1+ in size. Mallampati is class II. Neck circumference is 14-1/8 inches.  Chest: Clear to auscultation without wheezing, rhonchi or crackles noted.  Heart: S1+S2+0, regular and normal without murmurs, rubs or gallops noted.   Abdomen: Soft, non-tender and non-distended with normal bowel sounds appreciated on auscultation.  Extremities: There is no pitting edema in the distal lower extremities bilaterally.   Skin: Warm and dry without trophic changes noted.  Musculoskeletal: exam reveals no obvious joint deformities, tenderness or joint swelling or erythema.   Neurologically:  Mental status: The patient is awake, alert and oriented in all 4 spheres. Her immediate and remote memory, attention, language skills and fund of knowledge are appropriate. There is no evidence of aphasia, agnosia, apraxia or anomia. Speech is clear with normal prosody and enunciation. Thought process is linear. Mood is normal and affect is normal.  Cranial nerves II - XII are as described above under HEENT exam. In addition: shoulder shrug is normal with equal shoulder height noted. Motor exam: Normal bulk, strength and tone is noted. There is no drift, tremor or rebound. Romberg is negative.  Reflexes are 2+  throughout. Fine motor skills and coordination: grossly intact.  Cerebellar testing: No dysmetria or intention tremor. There is no truncal or gait ataxia.  Sensory exam: intact to light touch in the upper and lower extremities.  Gait, station and balance: She stands easily. No veering to one side is noted. No leaning to one side is noted. Posture is age-appropriate and stance is narrow based. Gait shows normal stride length and normal pace. No problems turning are noted. Tandem walk is unremarkable.   Assessment and Plan:  In summary, Toryn Dewalt is a very pleasant 57 y.o.-year old female with an underlying medical history of hypertension, hyperlipidemia, breast cancer, migraine headaches, depression, anxiety, recent diagnosis of seizure disorder and overweight state, whose history and physical exam are concerning for obstructive sleep apnea (OSA). I had a long chat with the patient and her sister about my findings and the diagnosis of OSA, its prognosis and treatment options. We talked about medical treatments, surgical interventions and non-pharmacological approaches. I explained in particular the risks and ramifications of untreated moderate to severe OSA, especially with respect to developing cardiovascular disease down the Road, including congestive heart failure, difficult to treat hypertension, cardiac arrhythmias, or stroke. Even type 2 diabetes has, in part, been linked to untreated OSA. Symptoms of untreated OSA include daytime sleepiness, memory problems, mood irritability and mood disorder such as depression and anxiety, lack of energy, as well as recurrent headaches, especially morning headaches. We talked about smoking cessation and trying to maintain a healthy lifestyle in general, as well as the importance of weight control. I encouraged the patient to eat healthy, exercise daily and keep well hydrated, to keep a scheduled bedtime and wake time routine, to not skip any meals and eat healthy  snacks in between meals. I advised the patient not to drive when feeling sleepy. I recommended the following at this time: sleep study with potential positive airway pressure titration. (We will score hypopneas at 4%).   I explained the sleep test procedure to the patient and also outlined possible surgical and non-surgical treatment options of OSA, including the use of a custom-made dental device (which would require a referral to a specialist dentist or oral surgeon), upper airway surgical options, such as pillar implants, radiofrequency surgery, tongue base surgery, and UPPP (which would involve a referral to an ENT surgeon). Rarely, jaw surgery such as mandibular advancement may be considered.  I also explained the CPAP treatment option to the patient, who indicated that she would be willing to try CPAP if the need arises. I explained the importance of being compliant with PAP treatment, not only for insurance purposes but primarily to improve Her symptoms, and for the patient's long term health benefit, including to reduce Her cardiovascular risks. I answered all their questions today and the patient and her sister were in agreement. I will likely to see her back after the sleep study is completed and encouraged her to call with any interim questions, concerns, problems or updates.   Thank you very much for allowing me to participate in the care of this nice patient. If I can be of any further assistance to you please do not hesitate to talk to me.  Sincerely,   Star Age, MD, PhD

## 2017-12-24 ENCOUNTER — Other Ambulatory Visit: Payer: Self-pay | Admitting: Family

## 2018-01-12 ENCOUNTER — Ambulatory Visit (INDEPENDENT_AMBULATORY_CARE_PROVIDER_SITE_OTHER): Payer: Managed Care, Other (non HMO) | Admitting: Neurology

## 2018-01-12 DIAGNOSIS — R0681 Apnea, not elsewhere classified: Secondary | ICD-10-CM

## 2018-01-12 DIAGNOSIS — G479 Sleep disorder, unspecified: Secondary | ICD-10-CM

## 2018-01-12 DIAGNOSIS — Z820 Family history of epilepsy and other diseases of the nervous system: Secondary | ICD-10-CM

## 2018-01-12 DIAGNOSIS — R351 Nocturia: Secondary | ICD-10-CM

## 2018-01-12 DIAGNOSIS — G4720 Circadian rhythm sleep disorder, unspecified type: Secondary | ICD-10-CM

## 2018-01-12 DIAGNOSIS — G4733 Obstructive sleep apnea (adult) (pediatric): Secondary | ICD-10-CM

## 2018-01-12 DIAGNOSIS — G4719 Other hypersomnia: Secondary | ICD-10-CM

## 2018-01-12 DIAGNOSIS — G40909 Epilepsy, unspecified, not intractable, without status epilepticus: Secondary | ICD-10-CM

## 2018-01-12 DIAGNOSIS — R0683 Snoring: Secondary | ICD-10-CM

## 2018-01-15 ENCOUNTER — Encounter: Payer: Self-pay | Admitting: Neurology

## 2018-01-15 ENCOUNTER — Telehealth: Payer: Self-pay

## 2018-01-15 NOTE — Addendum Note (Signed)
Addended by: Star Age on: 01/15/2018 08:35 AM   Modules accepted: Orders

## 2018-01-15 NOTE — Telephone Encounter (Signed)

## 2018-01-15 NOTE — Telephone Encounter (Signed)
Patient is returning your call.  

## 2018-01-15 NOTE — Telephone Encounter (Signed)
-----   Message from Star Age, MD sent at 01/15/2018  8:35 AM EDT ----- Patient referred by Dr. Leta Baptist, seen by me on 12/20/17, diagnostic PSG on 01/12/18.   Please call and notify the patient that the recent sleep study showed severe obstructive sleep apnea, with a total AHI of 34.5/hour, and O2 nadir of 72%. Given the severity of the sleep apnea, I recommend treatment for this in the form of CPAP. This will require a repeat sleep study for proper titration and mask fitting and correct monitoring of the oxygen saturations. Please explain to patient. I have placed an order in the chart. Thanks.  Star Age, MD, PhD [facility])

## 2018-01-15 NOTE — Progress Notes (Signed)
Patient referred by Dr. Leta Baptist, seen by me on 12/20/17, diagnostic PSG on 01/12/18.   Please call and notify the patient that the recent sleep study showed severe obstructive sleep apnea, with a total AHI of 34.5/hour, and O2 nadir of 72%. Given the severity of the sleep apnea, I recommend treatment for this in the form of CPAP. This will require a repeat sleep study for proper titration and mask fitting and correct monitoring of the oxygen saturations. Please explain to patient. I have placed an order in the chart. Thanks.  Star Age, MD, PhD [facility])

## 2018-01-15 NOTE — Telephone Encounter (Signed)
I called pt to discuss her sleep study results. No answer, left a message asking her to call me back. 

## 2018-01-15 NOTE — Procedures (Signed)
PATIENT'S NAME:  Eileen Holland, Eileen Holland DOB:      [birth year]      MR#:    [id number]     DATE OF RECORDING: 01/12/2018 REFERRING M.D.:  Andrey Spearman, MD Study Performed:   Baseline Polysomnogram HISTORY: 57 year old woman with a history of hypertension, hyperlipidemia, breast cancer, migraine headaches, depression, anxiety, recent diagnosis of seizure disorder and overweight state, who reports snoring and excessive daytime somnolence. The patient endorsed the Epworth Sleepiness Scale at 11 points. The patient's weight 153 pounds with a height of 62 (inches), resulting in a BMI of 27.3 kg/m2. The patient's neck circumference measured 14.2 inches.  CURRENT MEDICATIONS: Acetaminophen-Butalbital, Norvasc, Vitamin B, Vitamin D, Colace, HCTZ, Nizoral, Femara, Keppra, Xyzal, Cozaar, Toprol XL, Paxil, Pravachol, Prenatal vitamins   PROCEDURE:  This is a multichannel digital polysomnogram utilizing the Somnostar 11.2 system.  Electrodes and sensors were applied and monitored per AASM Specifications.   EEG, EOG, Chin and Limb EMG, were sampled at 200 Hz.  ECG, Snore and Nasal Pressure, Thermal Airflow, Respiratory Effort, CPAP Flow and Pressure, Oximetry was sampled at 50 Hz. Digital video and audio were recorded.      BASELINE STUDY  Lights Out was at 22:16 and Lights On at 05:02.  Total recording time (TRT) was 406.5 minutes, with a total sleep time (TST) of  355 minutes.   The patient's sleep latency was 10 minutes.  REM latency was 277 minutes, which is markedly delayed. The sleep efficiency was 87.3 %.     SLEEP ARCHITECTURE: WASO (Wake after sleep onset) was 47.5 minutes with mild sleep fragmentation noted, especially in the first third of the night. There were 22.5 minutes in Stage N1, 307.5 minutes Stage N2, 0 minutes Stage N3 and 25 minutes in Stage REM.  The percentage of Stage N1 was 6.3%, Stage N2 was 86.6%, which is markedly increased, Stage N3 was absent, and Stage R (REM sleep) was 7%, which is markedly  reduced. The arousals were noted as: 94 were spontaneous, 0 were associated with PLMs, 53 were associated with respiratory events.  RESPIRATORY ANALYSIS:  There were a total of 204 respiratory events:  170 obstructive apneas, 0 central apneas and 2 mixed apneas with a total of 172 apneas and an apnea index (AI) of 29.1 /hour. There were 32 hypopneas with a hypopnea index of 5.4 /hour. The patient also had 0 respiratory event related arousals (RERAs).      The total APNEA/HYPOPNEA INDEX (AHI) was 34.5/hour and the total RESPIRATORY DISTURBANCE INDEX was 0. 34.5 /hour.  16 events occurred in REM sleep and 62 events in NREM. The REM AHI was 38.4/hour, versus a non-REM AHI of 34.2. The patient spent 306 minutes of total sleep time in the supine position and 49 minutes in non-supine.. The supine AHI was 39.6 versus a non-supine AHI of 2.4.  OXYGEN SATURATION & C02:  The Wake baseline 02 saturation was 94%, with the lowest being 72%. Time spent below 89% saturation equaled 81 minutes.  PERIODIC LIMB MOVEMENTS: The patient had a total of 0 Periodic Limb Movements.  The Periodic Limb Movement (PLM) index was 0 and the PLM Arousal index was 0/hour.  Audio and video analysis did not show any abnormal or unusual movements, behaviors, phonations or vocalizations. The patient took 2 bathroom breaks. Mild to moderate snoring was noted. The EKG was in keeping with normal sinus rhythm (NSR).  Post-study, the patient indicated that sleep was the same as usual.   IMPRESSION: 1. Obstructive Sleep Apnea (  OSA) 2. Dysfunctions associated with Sleep stages or Arousals from Sleep  RECOMMENDATIONS: 1. This study demonstrates severe obstructive sleep apnea, with a total AHI of 34.5/hour, and O2 nadir of 72%. Given the severity of the sleep apnea, CPAP therapy is recommended; a full-night CPAP titration study is recommended to optimize therapy. Other treatment options may include avoidance of supine sleep position along  with weight loss, upper airway or jaw surgery in selected patients or the use of an oral appliance in certain patients. ENT evaluation and/or consultation with a maxillofacial surgeon or dentist may be feasible in some instances.    2. Please note that untreated obstructive sleep apnea may carry additional perioperative morbidity. Patients with significant obstructive sleep apnea (typically, in the moderate to severe degree) should receive, if possible, perioperative PAP (positive airway pressure) therapy and the surgeons and particularly the anesthesiologists should be informed of the diagnosis and the severity of the sleep disordered breathing. If feasible, the patient may be asked to bring his/her CPAP or BiPAP machine for a planned/elective surgery.  3. This study shows sleep fragmentation and abnormal sleep stage percentages; these are nonspecific findings and per se do not signify an intrinsic sleep disorder or a cause for the patient's sleep-related symptoms. Causes include (but are not limited to) the first night effect of the sleep study, circadian rhythm disturbances, medication effect or an underlying mood disorder or medical problem.  4. The patient should be cautioned not to drive, work at heights, or operate dangerous or heavy equipment when tired or sleepy. Review and reiteration of good sleep hygiene measures should be pursued with any patient. 5. The patient will be seen in follow-up in the sleep clinic at [facility] for discussion of the test results, symptom and treatment compliance review, further management strategies, etc. The referring provider will be notified of the test results.  I certify that I have reviewed the entire raw data recording prior to the issuance of this report in accordance with the Standards of Accreditation of the American Academy of Sleep Medicine (AASM)   Star Age, MD, PhD Diplomat, American Board of Psychiatry and Neurology (Neurology and sleep Medicine)

## 2018-02-09 ENCOUNTER — Ambulatory Visit (INDEPENDENT_AMBULATORY_CARE_PROVIDER_SITE_OTHER): Payer: Managed Care, Other (non HMO) | Admitting: Neurology

## 2018-02-09 DIAGNOSIS — R351 Nocturia: Secondary | ICD-10-CM

## 2018-02-09 DIAGNOSIS — G40909 Epilepsy, unspecified, not intractable, without status epilepticus: Secondary | ICD-10-CM

## 2018-02-09 DIAGNOSIS — G4733 Obstructive sleep apnea (adult) (pediatric): Secondary | ICD-10-CM

## 2018-02-09 DIAGNOSIS — G479 Sleep disorder, unspecified: Secondary | ICD-10-CM

## 2018-02-09 DIAGNOSIS — G4720 Circadian rhythm sleep disorder, unspecified type: Secondary | ICD-10-CM

## 2018-02-09 DIAGNOSIS — G4719 Other hypersomnia: Secondary | ICD-10-CM

## 2018-02-09 DIAGNOSIS — Z820 Family history of epilepsy and other diseases of the nervous system: Secondary | ICD-10-CM

## 2018-02-26 ENCOUNTER — Telehealth: Payer: Self-pay

## 2018-02-26 NOTE — Telephone Encounter (Signed)
I called pt. I advised pt that Dr. Rexene Alberts reviewed their sleep study results and found that pt did well with a bipap during her sleep study. Dr. Rexene Alberts recommends that pt start a bipap at home. I reviewed PAP compliance expectations with the pt. Pt is agreeable to starting a BiPAP. I advised pt that an order will be sent to a DME, [facility], and [facility] will call the pt within about one week after they file with the pt's insurance. [facility] will show the pt how to use the machine, fit for masks, and troubleshoot the BiPAP if needed. A follow up appt was made for insurance purposes with Dr. Rexene Alberts on 06/04/18 at 8:30am. Pt verbalized understanding to arrive 15 minutes early and bring their BiPAP. A letter with all of this information in it will be mailed to the pt as a reminder. I verified with the pt that the address we have on file is correct. Pt verbalized understanding of results. Pt had no questions at this time but was encouraged to call back if questions arise.

## 2018-02-26 NOTE — Telephone Encounter (Signed)
-----   Message from Star Age, MD sent at 02/26/2018  8:21 AM EDT ----- Patient referred by Dr. Leta Baptist, seen by me on 12/20/17, diagnostic PSG on 01/12/18. Patient had a CPAP titration study on 02/09/18.  Please call and inform patient that I have entered an order for treatment with positive airway pressure (PAP) treatment for obstructive sleep apnea (OSA). She did well during the latest sleep study with BiPAP. We will, therefore, arrange for a machine for home use through a DME (durable medical equipment) company of Her choice; and I will see the patient back in follow-up in about 10 weeks. Please also explain to the patient that I will be looking out for compliance data, which can be downloaded from the machine (stored on an SD card, that is inserted in the machine) or via remote access through a modem, that is built into the machine. At the time of the followup appointment we will discuss sleep study results and how it is going with PAP treatment at home. Please advise patient to bring Her machine at the time of the first FU visit, even though this is cumbersome. Bringing the machine for every visit after that will likely not be needed, but often helps for the first visit to troubleshoot if needed. Please re-enforce the importance of compliance with treatment and the need for [place] to monitor compliance data - often an insurance requirement and actually good feedback for the patient as far as how they are doing.  Also remind patient, that any interim PAP machine or mask issues should be first addressed with the DME company, as they can often help better with technical and mask fit issues. Please ask if patient has a preference regarding DME company.  Please also make sure, the patient has a follow-up appointment with me in about 10 weeks from the setup date, thanks. May see one of our nurse practitioners if needed for proper timing of the FU appointment.  Please fax or rout report to the referring provider.  Thanks,   Star Age, MD, PhD [facility])

## 2018-02-26 NOTE — Addendum Note (Signed)
Addended by: Star Age on: 02/26/2018 08:21 AM   Modules accepted: Orders

## 2018-02-26 NOTE — Procedures (Signed)
PATIENT'S NAME:  Eileen Holland, Shimko DOB:      [birth year]      MR#:    [id number]     DATE OF RECORDING: 02/09/2018 REFERRING M.D.:  Andrey Spearman, MD Study Performed:   CPAP  Titration HISTORY: 57 year old woman with a history of hypertension, hyperlipidemia, breast cancer, migraine headaches, depression, anxiety, recent diagnosis of seizure disorder and overweight state, who returns for a CPAP titration study. Her baseline PSG from 01/12/18 showed a total AHI of 34.5/hour, and O2 nadir of 72%. BMI of 28. kg/m2. The patient's neck circumference measured 14 inches.  CURRENT MEDICATIONS: Acetaminophen-Butalbital, Norvasc, Vitamin B, Vitamin D, Colace, HCTZ, Nizoral, Femara, Keppra, Xyzal, Cozaar, Toprol XL, Paxil, Pravachol, Prenatal vitamins   PROCEDURE:  This is a multichannel digital polysomnogram utilizing the SomnoStar 11.2 system.  Electrodes and sensors were applied and monitored per AASM Specifications.   EEG, EOG, Chin and Limb EMG, were sampled at 200 Hz.  ECG, Snore and Nasal Pressure, Thermal Airflow, Respiratory Effort, CPAP Flow and Pressure, Oximetry was sampled at 50 Hz. Digital video and audio were recorded.      The patient was fitted with a medium F20 FFM. CPAP was initiated at 5 cmH20 with heated humidity per AASM standards and pressure was advanced to 15 cm, but patient required a higher pressure. On CPAP of 15, her AHI was 18.5/hour, O2 nadir of 86%. She was started on BiPAP of 15/11 cm and titrated to a pressure of 21/16 cmH20 because of hypopneas, apneas and desaturations. At a PAP pressure of 21/16 cmH20, there was a reduction of the AHI to 0 with supine REM sleep achieved and O2 nadir of 91%.   Lights Out was at 21:58 and Lights On at 05:19. Total recording time (TRT) was 441.5 minutes, with a total sleep time (TST) of 411.5 minutes. The patient's sleep latency was 8.5 minutes. REM latency was 127 minutes, which is mildly delayed. The sleep efficiency was 93.2 %.    SLEEP  ARCHITECTURE: WASO (Wake after sleep onset)  was 21 minutes.  There were 16.5 minutes in Stage N1, 221.5 minutes Stage N2, 60.5 minutes Stage N3 and 113 minutes in Stage REM.  The percentage of Stage N1 was 4.%, Stage N2 was 53.8%, which is normal, Stage N3 was 14.7%, which is normal and Stage R (REM sleep) was 27.5%, which is mildly increased. The arousals were noted as: 28 were spontaneous, 0 were associated with PLMs, 14 were associated with respiratory events.  RESPIRATORY ANALYSIS:  There was a total of 35 respiratory events: 23 obstructive apneas, 4 central apneas and 0 mixed apneas with a total of 27 apneas and an apnea index (AI) of 3.9 /hour. There were 8 hypopneas with a hypopnea index of 1.2/hour. The patient also had 0 respiratory event related arousals (RERAs).      The total APNEA/HYPOPNEA INDEX  (AHI) was 5.1/hour and the total RESPIRATORY DISTURBANCE INDEX was 5.1 0./hour  1 events occurred in REM sleep and 34 events in NREM. The REM AHI was .5 /hour versus a non-REM AHI of 6.8 0./hour.  The patient spent 411.5 minutes of total sleep time in the supine position and 0 minutes in non-supine. The supine AHI was 5.1, versus a non-supine AHI of 0.0.  OXYGEN SATURATION & C02:  The baseline 02 saturation was 92%, with the lowest being 85%. Time spent below 89% saturation equaled 25 minutes.  PERIODIC LIMB MOVEMENTS:  The patient had a total of 0 Periodic Limb Movements. The Periodic  Limb Movement (PLM) index was 0 and the PLM Arousal index was 0 /hour.  Audio and video analysis did not show any abnormal or unusual movements, behaviors, phonations or vocalizations. The patient took 1 bathroom breaks. The EKG was in keeping with normal sinus rhythm (NSR).  Post-study, the patient indicated that sleep was better than usual.   IMPRESSION: 1. Obstructive Sleep Apnea (OSA) 2. Dysfunctions associated with Sleep stages or Arousals from Sleep   RECOMMENDATIONS: 1. This study demonstrates  resolution of the patient's obstructive sleep apnea with BiPAP therapy. I will, therefore, start the patient on home BiPAP treatment at a pressure of 21/16 cm via medium full face mask with heated humidity. The patient should be reminded to be fully compliant with PAP therapy to improve sleep related symptoms and decrease long term cardiovascular risks. The patient should be reminded, that it may take up to 3 months to get fully used to using PAP with all planned sleep. The earlier full compliance is achieved, the better long term compliance tends to be. Please note that untreated obstructive sleep apnea may carry additional perioperative morbidity. Patients with significant obstructive sleep apnea should receive perioperative PAP therapy and the surgeons and particularly the anesthesiologist should be informed of the diagnosis and the severity of the sleep disordered breathing. 2. The patient should be cautioned not to drive, work at heights, or operate dangerous or heavy equipment when tired or sleepy. Review and reiteration of good sleep hygiene measures should be pursued with any patient. 3. The patient will be seen in follow-up in the sleep clinic at [facility] for discussion of the test results, symptom and treatment compliance review, further management strategies, etc. The referring provider will be notified of the test results.   I certify that I have reviewed the entire raw data recording prior to the issuance of this report in accordance with the Standards of Accreditation of the American Academy of Sleep Medicine (AASM)     Star Age, MD, PhD Diplomat, American Board of Psychiatry and Neurology (Neurology and sleep Medicine)

## 2018-02-26 NOTE — Progress Notes (Signed)
Patient referred by Dr. Leta Baptist, seen by me on 12/20/17, diagnostic PSG on 01/12/18. Patient had a CPAP titration study on 02/09/18.  Please call and inform patient that I have entered an order for treatment with positive airway pressure (PAP) treatment for obstructive sleep apnea (OSA). She did well during the latest sleep study with BiPAP. We will, therefore, arrange for a machine for home use through a DME (durable medical equipment) company of Her choice; and I will see the patient back in follow-up in about 10 weeks. Please also explain to the patient that I will be looking out for compliance data, which can be downloaded from the machine (stored on an SD card, that is inserted in the machine) or via remote access through a modem, that is built into the machine. At the time of the followup appointment we will discuss sleep study results and how it is going with PAP treatment at home. Please advise patient to bring Her machine at the time of the first FU visit, even though this is cumbersome. Bringing the machine for every visit after that will likely not be needed, but often helps for the first visit to troubleshoot if needed. Please re-enforce the importance of compliance with treatment and the need for [place] to monitor compliance data - often an insurance requirement and actually good feedback for the patient as far as how they are doing.  Also remind patient, that any interim PAP machine or mask issues should be first addressed with the DME company, as they can often help better with technical and mask fit issues. Please ask if patient has a preference regarding DME company.  Please also make sure, the patient has a follow-up appointment with me in about 10 weeks from the setup date, thanks. May see one of our nurse practitioners if needed for proper timing of the FU appointment.  Please fax or rout report to the referring provider. Thanks,   Star Age, MD, PhD [facility])

## 2018-03-19 ENCOUNTER — Other Ambulatory Visit: Payer: Self-pay | Admitting: Family

## 2018-06-04 ENCOUNTER — Ambulatory Visit: Payer: Self-pay | Admitting: Neurology

## 2018-06-19 ENCOUNTER — Other Ambulatory Visit: Payer: Self-pay | Admitting: Family

## 2018-06-22 ENCOUNTER — Other Ambulatory Visit: Payer: Self-pay | Admitting: Family

## 2018-06-22 DIAGNOSIS — Z853 Personal history of malignant neoplasm of breast: Secondary | ICD-10-CM

## 2018-06-24 ENCOUNTER — Encounter: Payer: Self-pay | Admitting: Neurology

## 2018-06-25 ENCOUNTER — Other Ambulatory Visit: Payer: Self-pay | Admitting: *Deleted

## 2018-06-25 MED ORDER — LETROZOLE 2.5 MG PO TABS: 2.5000 mg | ORAL_TABLET | Freq: Every day | ORAL | 0 refills | Status: DC

## 2018-06-27 ENCOUNTER — Ambulatory Visit
Admission: RE | Admit: 2018-06-27 | Discharge: 2018-06-27 | Disposition: A | Discharge status: Home / Self Care | Payer: Managed Care, Other (non HMO) | Source: Ambulatory Visit | Attending: Family | Admitting: Family

## 2018-06-27 ENCOUNTER — Encounter: Payer: Self-pay | Admitting: Neurology

## 2018-06-27 ENCOUNTER — Ambulatory Visit: Payer: Managed Care, Other (non HMO) | Admitting: Neurology

## 2018-06-27 VITALS — BP 155/99 | HR 72 | Ht 62.5 in | Wt 164.0 lb

## 2018-06-27 DIAGNOSIS — G40909 Epilepsy, unspecified, not intractable, without status epilepticus: Secondary | ICD-10-CM

## 2018-06-27 DIAGNOSIS — Z853 Personal history of malignant neoplasm of breast: Secondary | ICD-10-CM

## 2018-06-27 DIAGNOSIS — G4733 Obstructive sleep apnea (adult) (pediatric): Secondary | ICD-10-CM | POA: Diagnosis not present

## 2018-06-27 NOTE — Progress Notes (Signed)
Subjective:    Patient ID: Eileen Holland is a 57 y.o. female.  HPI     Interim history:    Eileen Holland is a 57 year old right-handed woman with an underlying medical history of hypertension, hyperlipidemia, breast cancer, migraine headaches, depression, anxiety, recent diagnosis of seizure disorder and overweight state, who presents for follow-up consultation of her obstructive sleep apnea, after sleep study testing and starting BiPAP therapy at home. The patient is unaccompanied today. I first met her on 12/20/2013 at the request of Dr. Leta Baptist, at which time she reported snoring and daytime somnolence as well as witnessed apneas. She was advised to proceed with sleep study testing. She had a baseline sleep study, followed by a titration study. I went over her test results with her in detail today. Baseline sleep study from 01/12/2018 showed a sleep latency of 10 minutes, REM latency delayed at 277 minutes, sleep efficiency 87.3%. She had a reduced percentage of REM sleep and absence of slow-wave sleep. Total AHI was in the severe range at 34.5 per hour, average oxygen saturation 94%, nadir was 72%. She had no significant PLMS. She was therefore advised to proceed with a full night titration study. She had this on 02/09/2018. She was fitted with a full face mask and CPAP was initiated at 5 cm and advanced a 15 cm. She required a higher pressure and was therefore switched to BiPAP and titrated from 15/11 cm to up to 21/16 cm. On the final titration pressure her AHI was 0 per hour with supine REM sleep achieved and O2 nadir of 91%. She had no significant PLMS during the study. Based on her test results I prescribed BiPAP therapy for home use at a pressure of 21/16.  Today, 06/27/2016: I reviewed her BiPAP compliance data from 05/26/2018 through 06/24/2018 which is a total of 30 days, during which time she used her machine 29 days with percent used days greater than 4 hours at 97%, indicating excellent  compliance with an average usage of 7 hours and 57 minutes, residual AHI at goal at 0.7 per hour, leak high with the 95th percentile at 65.8 L/m pressure of 21/16 cm. She reports doing well, has adapted rather well to BiPAP therapy and endorses better sleep quality and less daytime somnolence. She is rather pleased with how she is doing. She is using a fullface mask and has noticed the leak. Of note, she is due for supplies and has received notification from her DME company to renew her supplies and there is possibility that changing the mask for a new one may reduce the leak. She has not had any seizures or seizure-like episodes and reports compliance with her medication. She was supposed to see Dr. Leta Baptist in September 2019 but either did not have an appointment or missed it. She is willing to make an appointment.   The patient's allergies, current medications, family history, past medical history, past social history, past surgical history and problem list were reviewed and updated as appropriate.   Previously:  12/20/2017: (She) reports snoring and excessive daytime somnolence. I reviewed your office note from 11/01/2017. She reports having been told in the past that she had pauses in her breathing by her boyfriend. She also sometimes wakes up from not being able to breathe properly. She has had no significant morning headaches, she reports nocturia about once per average night. Her sister's daughter has sleep apnea unsuccessfully uses a CPAP machine. Patient smokes about half pack per day, she drinks alcohol very  occasionally, caffeine in the form of coffee, at least 4 cups per day and rare tea. She lives with her daughter and daughter's 2 children who are 13 and 5. 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 She has a TV in her bedroom and uses it at night, actually reports that she cannot sleep without it, it stays on all night, there is no pets in her bedroom. Bedtime has been  between 10 and 11 but lately in the past couple of months she is trying to go to bed earlier around 9. Rise time is around 5:15 AM. She would be willing to get tested for sleep apnea and consider CPAP therapy if it is helpful to her.  Her Past Medical History Is Significant For: Past Medical History:  Diagnosis Date  . Allergy   . Anxiety    panic attacks per PCP history  . Breast calcification, right   . Cancer ([place])    right BR CA  with lumpectomy with radiation, no chemo - radiation in 2016  . Constipation    chronic- use stool softener and OTC fiber and prn miralax   . Depression   . Headache    hx of per PCP history  . Headache, migraine 07/04/2014   Last Assessment & Plan:  Refilled butalbital for rare use, once monthly as cannot take NSAIDs.  Advised if needs it more regularly, to follow-up and discuss management furtehr   . High cholesterol   . Hyperlipidemia   . Hypertension   . Personal history of radiation therapy 2016  . Radiation 07/13/15-08/03/15   right breast 4256 cGy    Her Past Surgical History Is Significant For: Past Surgical History:  Procedure Laterality Date  . BREAST BIOPSY Right 04/28/2015  . BREAST LUMPECTOMY Right 06/09/2015  . BREAST LUMPECTOMY WITH RADIOACTIVE SEED LOCALIZATION Right 06/09/2015   Procedure: RIGHT BREAST LUMPECTOMY WITH RADIOACTIVE SEED LOCALIZATION;  Surgeon: Erroll Luna, MD;  Location: [facility];  Service: General;  Laterality: Right;  . COLON SURGERY  07/17/2014  . COLONOSCOPY    . colonscopy     . KNEE ARTHROSCOPY Right 2000  . POLYPECTOMY  07/17/2014   polyp removed surgicallty   . VAGINAL HYSTERECTOMY  2012    Her Family History Is Significant For: Family History  Problem Relation Age of Onset  . Lung cancer Father 35       former smoker  . Colon polyps Father        unspecified number  . Breast cancer Sister 52       negative BRCA testing in 2007  . Stomach cancer Maternal Uncle        dx.  50s-early 25s  . Heart attack Paternal Uncle   . Colon cancer Neg Hx   . Esophageal cancer Neg Hx   . Rectal cancer Neg Hx     Her Social History Is Significant For: Social History   Socioeconomic History  . Marital status: Single    Spouse name: Not on file  . Number of children: Not on file  . Years of education: Not on file  . Highest education level: Not on file  Occupational History  . Not on file  Social Needs  . Financial resource strain: Not on file  . Food insecurity:    Worry: Not on file    Inability: Not on file  . Transportation needs:    Medical: Not on file    Non-medical: Not on file  Tobacco Use  . Smoking status: Current Every Day Smoker    Packs/day: 0.50    Years: 32.00    Pack years: 16.00    Types: Cigarettes  . Smokeless tobacco: Never Used  Substance and Sexual Activity  . Alcohol use: Yes    Alcohol/week: 0.0 standard drinks    Comment: occas - maybe 1 drink every 3 mos  . Drug use: No  . Sexual activity: Not on file  Lifestyle  . Physical activity:    Days per week: Not on file    Minutes per session: Not on file  . Stress: Not on file  Relationships  . Social connections:    Talks on phone: Not on file    Gets together: Not on file    Attends religious service: Not on file    Active member of club or organization: Not on file    Attends meetings of clubs or organizations: Not on file    Relationship status: Not on file  Other Topics Concern  . Not on file  Social History Narrative  . Not on file    Her Allergies Are:  Allergies  Allergen Reactions  . Aspirin Hives  . Ibuprofen Hives  :   Her Current Medications Are:  Outpatient Encounter Medications as of 06/27/2018  Medication Sig  . ACETAMINOPHEN-BUTALBITAL 50-325 MG TABS Take 1 tablet by mouth every 4 (four) hours as needed (headache.).  Marland Kitchen amLODipine (NORVASC) 10 MG tablet Take 10 mg by mouth every evening. @'@7'$ :30 pm  . B Complex Vitamins (B COMPLEX PO) Take 1 tablet  by mouth daily at 12 noon. (B complex-C-E-zinc)  . Cholecalciferol (VITAMIN D HIGH POTENCY) 1000 units capsule Take 1,000 Units daily by mouth.   . docusate sodium (COLACE) 100 MG capsule Take 100 mg by mouth 2 (two) times daily.  . hydrochlorothiazide 25 MG tablet Take 25 mg by mouth every morning.   Marland Kitchen ketoconazole (NIZORAL) 2 % cream Apply 1 application topically daily as needed for irritation.  Marland Kitchen letrozole (FEMARA) 2.5 MG tablet TAKE 1 TABLET BY MOUTH EVERY DAY  . letrozole (FEMARA) 2.5 MG tablet Take 1 tablet (2.5 mg total) by mouth daily.  Marland Kitchen levETIRAcetam (KEPPRA) 500 MG tablet Take 1 tablet (500 mg total) by mouth 2 (two) times daily.  Marland Kitchen levocetirizine (XYZAL) 5 MG tablet Take 5 mg by mouth daily at 12 noon. For allergies   . losartan (COZAAR) 100 MG tablet Take 100 mg by mouth every morning.  . metoprolol succinate (TOPROL-XL) 100 MG 24 hr tablet Take 100 mg by mouth every morning. Take with or immediately following a meal.  . PARoxetine (PAXIL) 20 MG tablet Take 20 mg by mouth every morning.    . pravastatin (PRAVACHOL) 20 MG tablet Take 20 mg by mouth daily.   . Prenatal Vit-Fe Fumarate-FA (PRENATAL PO) Take 1 tablet by mouth daily at 12 noon.   No facility-administered encounter medications on file as of 06/27/2018.   :  Review of Systems:  Out of a complete 14 point review of systems, all are reviewed and negative with the exception of these symptoms as listed below:  Review of Systems  Neurological:       Pt presents today to discuss her bipap. Pt feels more rested.    Objective:  Neurological Exam  Physical Exam Physical Examination:   Vitals:   06/27/18 0930  BP: (!) 155/99  Pulse: 72    General Examination: The patient is a very pleasant 57  y.o. female in no acute distress. She appears well-developed and well-nourished and well groomed.   HEENT: Normocephalic, atraumatic, pupils are equal, round and reactive to light and accommodation. Extraocular tracking is  good without limitation to gaze excursion or nystagmus noted. Normal smooth pursuit is noted. Hearing is grossly intact. Face is symmetric with normal facial animation and normal facial sensation. Speech is clear with no dysarthria noted. There is no hypophonia. There is no lip, neck/head, jaw or voice tremor. Neck shows FROM. Oropharynx exam reveals: mild mouth dryness, adequate dental hygiene and moderate airway crowding. Tongue protrudes centrally and palate elevates symmetrically.  Chest: Clear to auscultation without wheezing, rhonchi or crackles noted.  Heart: S1+S2+0, regular and normal without murmurs, rubs or gallops noted.   Abdomen: Soft, non-tender and non-distended with normal bowel sounds appreciated on auscultation.  Extremities: There is no pitting edema in the distal lower extremities bilaterally.   Skin: Warm and dry without trophic changes noted.  Musculoskeletal: exam reveals no obvious joint deformities, tenderness or joint swelling or erythema.   Neurologically:  Mental status: The patient is awake, alert and oriented in all 4 spheres. Her immediate and remote memory, attention, language skills and fund of knowledge are appropriate. There is no evidence of aphasia, agnosia, apraxia or anomia. Speech is clear with normal prosody and enunciation. Thought process is linear. Mood is normal and affect is normal.  Cranial nerves II - XII are as described above under HEENT exam. In addition: shoulder shrug is normal with equal shoulder height noted. Motor exam: Normal bulk, strength and tone is noted. There is no tremor. Fine motor skills and coordination: grossly intact.  Cerebellar testing: No dysmetria or intention tremor. There is no truncal or gait ataxia.  Sensory exam: intact to light touch in the upper and lower extremities.  Gait, station and balance: She stands easily. No veering to one side is noted. No leaning to one side is noted. Posture is age-appropriate and  stance is narrow based. Gait shows normal stride length and normal pace. No problems turning are noted.  Assessment and Plan:  In summary, Marlia Schewe is a very pleasant 57 year old female with an underlying medical history of hypertension, hyperlipidemia, breast cancer, migraine headaches, depression, anxiety, recent diagnosis of seizure disorder and overweight state, who presents for follow-up consultation of her obstructive sleep apnea, after sleep study testing in May and June 2019. Her baseline sleep study showed severe sleep apnea and she did well with BiPAP therapy during her titration study in June 2019. She is compliant with her BiPAP therapy at is commended for this. She endorses better sleep quality and sleep consolidation, less daytime somnolence. She is advised to continue with treatment. She is encouraged to make a follow-up appointment with Dr. Leta Baptist, as previously discussed. From a sleep standpoint she is encouraged to follow-up in 6 months and then hopefully yearly thereafter. She is also advised to make an appointment with her DME company for possibly a mask refit and getting her updated supplies. I answered all her questions today and she was in agreement, today we discussed her sleep study results and also reviewed her compliance data in detail. I spent 25 minutes in total face-to-face time with the patient, more than 50% of which was spent in counseling and coordination of care, reviewing test results, reviewing medication and discussing or reviewing the diagnosis of OSA, its prognosis and treatment options. Pertinent laboratory and imaging test results that were available during this visit with the patient  were reviewed by me and considered in my medical decision making (see chart for details).

## 2018-06-27 NOTE — Patient Instructions (Addendum)
Please continue using your BiPAP regularly. While your insurance requires that you use BiPAP at least 4 hours each night on 70% of the nights, I recommend, that you not skip any nights and use it throughout the night if you can. Getting used to BiPAP and staying with the treatment long term does take time and patience and discipline. Untreated obstructive sleep apnea when it is moderate to severe can have an adverse impact on cardiovascular health and raise her risk for heart disease, arrhythmias, hypertension, congestive heart failure, stroke and diabetes. Untreated obstructive sleep apnea causes sleep disruption, nonrestorative sleep, and sleep deprivation. This can have an impact on your day to day functioning and cause daytime sleepiness and impairment of cognitive function, memory loss, mood disturbance, and problems focussing. Using BiPAP regularly can improve these symptoms.    Please make an appointment with [facility] for supplies and possible mask refit as the air leak is too high. You have done rather well on the BiPAP, please be sure to change your filter every 1-2 months, your mask about every 3 months, hose about 6 months, humidifier chamber about yearly. Some restrictions are imposed by her insurance carrier with regard to how frequently you can get certain supplies.   Please make a FU appt for Dr. Leta Baptist to see you.  Please follow up for sleep apnea in 6 months, keep up the good work! If you continue to do well on BiPAP, we will see you once a year thereafter.

## 2018-06-27 NOTE — Progress Notes (Signed)
Order for mask refit sent to [facility] via community message in [place]. Confirmation received that the order transmitted was successful.

## 2018-07-23 ENCOUNTER — Encounter: Payer: Self-pay | Admitting: Diagnostic Neuroimaging

## 2018-07-23 ENCOUNTER — Ambulatory Visit: Payer: Managed Care, Other (non HMO) | Admitting: Diagnostic Neuroimaging

## 2018-07-23 VITALS — BP 132/88 | HR 57 | Ht 62.5 in | Wt 164.0 lb

## 2018-07-23 DIAGNOSIS — G40909 Epilepsy, unspecified, not intractable, without status epilepticus: Secondary | ICD-10-CM

## 2018-07-23 MED ORDER — LEVETIRACETAM 500 MG PO TABS: 500.0000 mg | ORAL_TABLET | Freq: Two times a day (BID) | ORAL | 4 refills | Status: DC

## 2018-07-23 NOTE — Progress Notes (Signed)
[facility]  PATIENT: Eileen Holland DOB: 1960-10-17  REFERRING CLINICIAN:  Starla Link, K HISTORY FROM: patient  REASON FOR VISIT: follow up   HISTORICAL  CHIEF COMPLAINT:  Chief Complaint  Patient presents with  . Seizures    rm 7, "no seizure activity; wants letter to begin driving again"  . Follow-up    6 month    HISTORY OF PRESENT ILLNESS:   UPDATE (07/23/18, VRP): Since last visit, doing well. Symptoms are are resolved. No seizures. No alleviating or aggravating factors. Tolerating levetiracetam 567m twice a day.    PRIOR HPI (11/01/17): 57year old female with history of breast cancer, hypertension, hyperlipidemia, presented to hospital on 10/26/17 with confusion, excessive aphasia, tongue bite.  Patient may have had the seizure the night before in her sleep.  Patient was admitted for workup.  MRI of the brain, CTA of the head and neck were unremarkable.  EEG showed left frontotemporal epileptiform discharges.  Patient was started on levetiracetam 500 mg twice a day.  Since that time patient has done well.  No further events.  No prior history of seizures.  No prior history of head trauma, meningitis or encephalitis.  No family history of seizure.   REVIEW OF SYSTEMS: Full 14 system review of systems performed and negative with exception of: OSA constipation snoring.   ALLERGIES: Allergies  Allergen Reactions  . Aspirin Hives  . Ibuprofen Hives    HOME MEDICATIONS: Outpatient Medications Prior to Visit  Medication Sig Dispense Refill  . ACETAMINOPHEN-BUTALBITAL 50-325 MG TABS Take 1 tablet by mouth every 4 (four) hours as needed (headache.).    .Marland KitchenamLODipine (NORVASC) 10 MG tablet Take 10 mg by mouth every evening. @'@7' :30 pm    . B Complex Vitamins (B COMPLEX PO) Take 1 tablet by mouth daily at 12 noon. (B complex-C-E-zinc)    . Cholecalciferol (VITAMIN D HIGH POTENCY) 1000 units capsule Take 1,000 Units daily by mouth.     . docusate sodium (COLACE)  100 MG capsule Take 100 mg by mouth 2 (two) times daily.    . hydrochlorothiazide 25 MG tablet Take 25 mg by mouth every morning.     .Marland Kitchenketoconazole (NIZORAL) 2 % cream Apply 1 application topically daily as needed for irritation.    .Marland Kitchenletrozole (FEMARA) 2.5 MG tablet Take 1 tablet (2.5 mg total) by mouth daily. 90 tablet 0  . levETIRAcetam (KEPPRA) 500 MG tablet Take 1 tablet (500 mg total) by mouth 2 (two) times daily. 180 tablet 4  . levocetirizine (XYZAL) 5 MG tablet Take 5 mg by mouth daily at 12 noon. For allergies     . losartan (COZAAR) 100 MG tablet Take 100 mg by mouth every morning.    . metoprolol succinate (TOPROL-XL) 100 MG 24 hr tablet Take 100 mg by mouth every morning. Take with or immediately following a meal.    . PARoxetine (PAXIL) 20 MG tablet Take 20 mg by mouth every morning.      . pravastatin (PRAVACHOL) 20 MG tablet Take 20 mg by mouth daily.     . Prenatal Vit-Fe Fumarate-FA (PRENATAL PO) Take 1 tablet by mouth daily at 12 noon.    .Marland Kitchenletrozole (FEMARA) 2.5 MG tablet TAKE 1 TABLET BY MOUTH EVERY DAY 90 tablet 0   No facility-administered medications prior to visit.     PAST MEDICAL HISTORY: Past Medical History:  Diagnosis Date  . Allergy   . Anxiety    panic attacks per PCP history  .  Breast calcification, right   . Cancer ([place])    right BR CA  with lumpectomy with radiation, no chemo - radiation in 2016  . Constipation    chronic- use stool softener and OTC fiber and prn miralax   . Depression   . Headache    hx of per PCP history  . Headache, migraine 07/04/2014   Last Assessment & Plan:  Refilled butalbital for rare use, once monthly as cannot take NSAIDs.  Advised if needs it more regularly, to follow-up and discuss management furtehr   . High cholesterol   . Hyperlipidemia   . Hypertension   . Personal history of radiation therapy 2016  . Radiation 07/13/15-08/03/15   right breast 4256 cGy  . Seizures ([place]) 10/26/2017    PAST SURGICAL  HISTORY: Past Surgical History:  Procedure Laterality Date  . BREAST BIOPSY Right 04/28/2015  . BREAST LUMPECTOMY Right 06/09/2015  . BREAST LUMPECTOMY WITH RADIOACTIVE SEED LOCALIZATION Right 06/09/2015   Procedure: RIGHT BREAST LUMPECTOMY WITH RADIOACTIVE SEED LOCALIZATION;  Surgeon: Erroll Luna, MD;  Location: [facility];  Service: General;  Laterality: Right;  . COLON SURGERY  07/17/2014  . COLONOSCOPY    . colonscopy     . KNEE ARTHROSCOPY Right 2000  . POLYPECTOMY  07/17/2014   polyp removed surgicallty   . VAGINAL HYSTERECTOMY  2012    FAMILY HISTORY: Family History  Problem Relation Age of Onset  . Lung cancer Father 32       former smoker  . Colon polyps Father        unspecified number  . Breast cancer Sister 42       negative BRCA testing in 2007  . Stomach cancer Maternal Uncle        dx. 50s-early 53s  . Heart attack Paternal Uncle   . Colon cancer Neg Hx   . Esophageal cancer Neg Hx   . Rectal cancer Neg Hx     SOCIAL HISTORY:  Social History   Socioeconomic History  . Marital status: Single    Spouse name: Not on file  . Number of children: Not on file  . Years of education: Not on file  . Highest education level: Not on file  Occupational History  . Not on file  Social Needs  . Financial resource strain: Not on file  . Food insecurity:    Worry: Not on file    Inability: Not on file  . Transportation needs:    Medical: Not on file    Non-medical: Not on file  Tobacco Use  . Smoking status: Current Every Day Smoker    Packs/day: 0.50    Years: 32.00    Pack years: 16.00    Types: Cigarettes  . Smokeless tobacco: Never Used  Substance and Sexual Activity  . Alcohol use: Yes    Alcohol/week: 0.0 standard drinks    Comment: occas - maybe 1 drink every 3 mos  . Drug use: No  . Sexual activity: Not on file  Lifestyle  . Physical activity:    Days per week: Not on file    Minutes per session: Not on file  . Stress: Not  on file  Relationships  . Social connections:    Talks on phone: Not on file    Gets together: Not on file    Attends religious service: Not on file    Active member of club or organization: Not on file    Attends meetings of clubs  or organizations: Not on file    Relationship status: Not on file  . Intimate partner violence:    Fear of current or ex partner: Not on file    Emotionally abused: Not on file    Physically abused: Not on file    Forced sexual activity: Not on file  Other Topics Concern  . Not on file  Social History Narrative  . Not on file     PHYSICAL EXAM  GENERAL EXAM/CONSTITUTIONAL: Vitals:  Vitals:   07/23/18 1555  BP: 132/88  Pulse: (!) 57  Weight: 164 lb (74.4 kg)  Height: 5' 2.5" (1.588 m)   Body mass index is 29.52 kg/m. No exam data present  Patient is in no distress; well developed, nourished and groomed; neck is supple  CARDIOVASCULAR:  Examination of carotid arteries is normal; no carotid bruits  Regular rate and rhythm, no murmurs  Examination of peripheral vascular system by observation and palpation is normal  EYES:  Ophthalmoscopic exam of optic discs and posterior segments is normal; no papilledema or hemorrhages  MUSCULOSKELETAL:  Gait, strength, tone, movements noted in Neurologic exam below  NEUROLOGIC: MENTAL STATUS:  No flowsheet data found.  awake, alert, oriented to person, place and time  recent and remote memory intact  normal attention and concentration  language fluent, comprehension intact, naming intact,   fund of knowledge appropriate  CRANIAL NERVE:   2nd - no papilledema on fundoscopic exam  2nd, 3rd, 4th, 6th - pupils equal and reactive to light, visual fields full to confrontation, extraocular muscles intact, no nystagmus  5th - facial sensation symmetric  7th - facial strength symmetric  8th - hearing intact  9th - palate elevates symmetrically, uvula midline  11th - shoulder shrug  symmetric  12th - tongue protrusion midline  MOTOR:   normal bulk and tone, full strength in the BUE, BLE  SENSORY:   normal and symmetric to light touch, temperature, vibration  COORDINATION:   finger-nose-finger, fine finger movements normal  REFLEXES:   deep tendon reflexes present and symmetric  GAIT/STATION:   narrow based gait    DIAGNOSTIC DATA (LABS, IMAGING, TESTING) - I reviewed patient records, labs, notes, testing and imaging myself where available.  Lab Results  Component Value Date   WBC 7.8 10/28/2017   HGB 12.7 10/28/2017   HCT 38.4 10/28/2017   MCV 104.3 (H) 10/28/2017   PLT 226 10/28/2017      Component Value Date/Time   NA 141 10/28/2017 0409   NA 139 03/28/2017 1034   NA 138 09/27/2016 0820   K 3.8 10/28/2017 0409   K 4.0 03/28/2017 1034   K 3.5 09/27/2016 0820   CL 105 10/28/2017 0409   CL 103 03/28/2017 1034   CO2 25 10/28/2017 0409   CO2 30 03/28/2017 1034   CO2 27 09/27/2016 0820   GLUCOSE 91 10/28/2017 0409   GLUCOSE 110 03/28/2017 1034   BUN 11 10/28/2017 0409   BUN 11 03/28/2017 1034   BUN 8.6 09/27/2016 0820   CREATININE 0.84 10/28/2017 0409   CREATININE 1.1 03/28/2017 1034   CREATININE 0.9 09/27/2016 0820   CALCIUM 9.3 10/28/2017 0409   CALCIUM 9.8 03/28/2017 1034   CALCIUM 9.8 09/27/2016 0820   PROT 8.1 10/26/2017 1425   PROT 8.0 03/28/2017 1034   PROT 7.6 09/27/2016 0820   ALBUMIN 4.3 10/26/2017 1425   ALBUMIN 3.8 03/28/2017 1034   ALBUMIN 3.8 09/27/2016 0820   AST 37 10/26/2017 1425   AST 25  03/28/2017 1034   AST 20 09/27/2016 0820   ALT 22 10/26/2017 1425   ALT 21 03/28/2017 1034   ALT 16 09/27/2016 0820   ALKPHOS 67 10/26/2017 1425   ALKPHOS 81 03/28/2017 1034   ALKPHOS 80 09/27/2016 0820   BILITOT 0.7 10/26/2017 1425   BILITOT 0.70 03/28/2017 1034   BILITOT 0.43 09/27/2016 0820   GFRNONAA >60 10/28/2017 0409   GFRAA >60 10/28/2017 0409   Lab Results  Component Value Date   CHOL 173 10/27/2017   HDL  61 10/27/2017   LDLCALC 86 10/27/2017   TRIG 132 10/27/2017   CHOLHDL 2.8 10/27/2017   Lab Results  Component Value Date   HGBA1C 4.7 (L) 10/27/2017   No results found for: VITAMINB12 No results found for: TSH   10/27/17 EEG - This awake andasleepEEG is abnormal due to left frontotemporal spike and wave discharges.   10/26/17 MRI brain 1. No acute intracranial abnormality identified. 2. No structural cause of seizure. 3. Nonspecific T2 FLAIR hyperintense foci are present in supratentorial white matter and brainstem, probably represent chronic microvascular ischemic changes given risk factors. Sequelae of demyelination considered less likely.  10/26/17 CTA head / neck - Normal appearance of the brain. No visible stroke. No sign of metastatic disease. - Negative CT angiography. No evidence of atherosclerotic disease. No large or medium vessel occlusion.     ASSESSMENT AND PLAN  57 y.o. year old female here with new onset seizure on 10/26/17, with abnormal EEG, now on levetiracetam 500 mg twice a day.   Dx:  No diagnosis found.   PLAN:  SEIZURE DISORDER (localization related, idiopathic; last seizure 10/26/17) - continue levetiracetam 569m twice a day  - ok to return to driving - seizure precautions reviewed  OSA/SNORING/DAYTIME FATIGUE (excessive daytime fatigue, witness apnea, snoring) - continue CPAP  Meds ordered this encounter  Medications  . levETIRAcetam (KEPPRA) 500 MG tablet    Sig: Take 1 tablet (500 mg total) by mouth 2 (two) times daily.    Dispense:  180 tablet    Refill:  4   Return in about 1 year (around 07/24/2019) for with NP.   VPenni Bombard MD 198/09/6413 48:30PM Certified in Neurology, Neurophysiology and Neuroimaging  [facility] [address] SM[place]am[place] [phone]

## 2018-09-25 ENCOUNTER — Other Ambulatory Visit: Payer: Self-pay | Admitting: Family

## 2018-09-25 ENCOUNTER — Other Ambulatory Visit: Payer: Self-pay | Admitting: *Deleted

## 2018-09-25 DIAGNOSIS — C50411 Malignant neoplasm of upper-outer quadrant of right female breast: Secondary | ICD-10-CM

## 2018-09-25 MED ORDER — LETROZOLE 2.5 MG PO TABS: 2.5000 mg | ORAL_TABLET | Freq: Every day | ORAL | 2 refills | Status: DC

## 2018-11-24 ENCOUNTER — Other Ambulatory Visit: Payer: Self-pay | Admitting: Diagnostic Neuroimaging

## 2018-12-18 ENCOUNTER — Telehealth: Payer: Self-pay | Admitting: Neurology

## 2018-12-18 NOTE — Telephone Encounter (Signed)
Due to current COVID 19 pandemic, our office is severely reducing in office visits until further notice, in order to minimize the risk to our patients and healthcare providers.   Called patient to offer a virtual visit for her 5/13 appointment. Patient accepted virtual visit, but requested her appt time be later in the day. Patient rescheduled for 5/11 at 3 PM. Patient verbalized understanding of doxy[employer]me process and understands that she will receive an e-mail with link as well as directions. Patient is aware that she will receive calls from RN and front office staff.  Pt understands that although there may be some limitations with this type of visit, we will take all precautions to reduce any security or privacy concerns.  Pt understands that this will be treated like an in office visit and we will file with pt's insurance, and there may be a patient responsible charge related to this service.

## 2018-12-20 NOTE — Telephone Encounter (Signed)
Pt has called RN Kristen back, she is asking for a call back

## 2018-12-20 NOTE — Telephone Encounter (Signed)
I called pt to update her chart prior to her appt. No answer, left a message asking her to call me back.

## 2018-12-20 NOTE — Telephone Encounter (Signed)
I called pt. Pt's meds, allergies, and PMH were updated.  Pt reports that her bipap is going well. Pt did receive the email from [place]. Pt had no questions at this time but was encouraged to call back if questions arise.

## 2018-12-24 ENCOUNTER — Encounter: Payer: Self-pay | Admitting: Neurology

## 2018-12-24 ENCOUNTER — Other Ambulatory Visit: Payer: Self-pay

## 2018-12-24 ENCOUNTER — Ambulatory Visit (INDEPENDENT_AMBULATORY_CARE_PROVIDER_SITE_OTHER): Payer: BLUE CROSS/BLUE SHIELD | Admitting: Neurology

## 2018-12-24 DIAGNOSIS — G4733 Obstructive sleep apnea (adult) (pediatric): Secondary | ICD-10-CM

## 2018-12-24 NOTE — Progress Notes (Signed)
Order for bipap supplies sent to [facility] via community message. Confirmation received that the order transmitted was successful.

## 2018-12-24 NOTE — Progress Notes (Signed)
Interim history:  Eileen Holland is a 58 year old right-handed woman with an underlying medical history of hypertension, hyperlipidemia, breast cancer, migraine headaches, depression, anxiety, recent diagnosis of seizure disorder and overweight state, who presents for a virtual, video based appointment via [email or web address] for follow-up consultation of Eileen Holland obstructive sleep apnea, on BiPAP. The patient is unaccompanied today and joins via cellph from home, I am located in my office.  I last saw Eileen Holland on 06/27/2018, at which time Eileen Holland was compliant with Eileen Holland autoBiPAP.  Eileen Holland reported doing well, had adjusted quite well to the new machine and reported better sleep quality and less daytime somnolence.    Eileen Holland had an interim appointment with Dr. Leta Baptist and reviewed the note from 07/23/2018.  Eileen Holland was deemed stable from the seizure standpoint and advised to follow-up in 1 year.    Today, 12/24/2018: Please also see below for virtual visit documentation.  I reviewed Eileen Holland BiPAP compliance data from 11/20/2018 through 12/19/2018 which is a total of 30 days, during which time Eileen Holland used Eileen Holland BiPAP every night with percent used days greater than 4 hours at 100%, indicating superb compliance with an average usage of 8 hours and 51 minutes, residual AHI at goal at 0.6/h, leak on the very high side consistently however at 93.3 L/min for the 95th percentile, maximum IPAP of 21, minimum EPAP of 16, pressure support of 4.   The patient's allergies, current medications, family history, past medical history, past social history, past surgical history and problem list were reviewed and updated as appropriate.    Previously:   I first met Eileen Holland on 12/20/2017 at the request of Dr. Leta Baptist, at which time Eileen Holland reported snoring and daytime somnolence as well as witnessed apneas. Eileen Holland was advised to proceed with sleep study testing. Eileen Holland had a baseline sleep study, followed by a titration study. I went over Eileen Holland test results with Eileen Holland in detail today.  Baseline sleep study from 01/12/2018 showed a sleep latency of 10 minutes, REM latency delayed at 277 minutes, sleep efficiency 87.3%. Eileen Holland had a reduced percentage of REM sleep and absence of slow-wave sleep. Total AHI was in the severe range at 34.5 per hour, average oxygen saturation 94%, nadir was 72%. Eileen Holland had no significant PLMS. Eileen Holland was therefore advised to proceed with a full night titration study. Eileen Holland had this on 02/09/2018. Eileen Holland was fitted with a full face mask and CPAP was initiated at 5 cm and advanced a 15 cm. Eileen Holland required a higher pressure and was therefore switched to BiPAP and titrated from 15/11 cm to up to 21/16 cm. On the final titration pressure Eileen Holland AHI was 0 per hour with supine REM sleep achieved and O2 nadir of 91%. Eileen Holland had no significant PLMS during the study. Based on Eileen Holland test results I prescribed BiPAP therapy for home use at a pressure of 21/16.   I reviewed Eileen Holland BiPAP compliance data from 05/26/2018 through 06/24/2018 which is a total of 30 days, during which time Eileen Holland used Eileen Holland machine 29 days with percent used days greater than 4 hours at 97%, indicating excellent compliance with an average usage of 7 hours and 57 minutes, residual AHI at goal at 0.7 per hour, leak high with the 95th percentile at 65.8 L/m pressure of 21/16 cm.    12/20/2017: (Eileen Holland) reports snoring and excessive daytime somnolence. I reviewed your office note from 11/01/2017. Eileen Holland reports having been told in the past that Eileen Holland had pauses in Eileen Holland breathing by Eileen Holland boyfriend. Eileen Holland also sometimes  wakes up from not being able to breathe properly. Eileen Holland has had no significant morning headaches, Eileen Holland reports nocturia about once per average night. Eileen Holland sisters daughter has sleep apnea unsuccessfully uses a CPAP machine. Patient smokes about half pack per day, Eileen Holland drinks alcohol very occasionally, caffeine in the form of coffee, at least 4 cups per day and rare tea. Eileen Holland lives with Eileen Holland daughter and daughters 2 children who are 54 and 5.  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 Eileen Holland has a TV in Eileen Holland bedroom and uses it at night, actually reports that Eileen Holland cannot sleep without it, it stays on all night, there is no pets in Eileen Holland bedroom. Bedtime has been between 10 and 11 but lately in the past couple of months Eileen Holland is trying to go to bed earlier around 9. Rise time is around 5:15 AM. Eileen Holland would be willing to get tested for sleep apnea and consider CPAP therapy if it is helpful to Eileen Holland.  Eileen Holland Past Medical History Is Significant For: Past Medical History:  Diagnosis Date   Allergy    Anxiety    panic attacks per PCP history   Breast calcification, right    Cancer ([place])    right BR CA  with lumpectomy with radiation, no chemo - radiation in 2016   Constipation    chronic- use stool softener and OTC fiber and prn miralax    Depression    Headache    hx of per PCP history   Headache, migraine 07/04/2014   Last Assessment & Plan:  Refilled butalbital for rare use, once monthly as cannot take NSAIDs.  Advised if needs it more regularly, to follow-up and discuss management furtehr    High cholesterol    Hyperlipidemia    Hypertension    Personal history of radiation therapy 2016   Radiation 07/13/15-08/03/15   right breast 4256 cGy   Seizures ([place]) 10/26/2017    Eileen Holland Past Surgical History Is Significant For: Past Surgical History:  Procedure Laterality Date   BREAST BIOPSY Right 04/28/2015   BREAST LUMPECTOMY Right 06/09/2015   BREAST LUMPECTOMY WITH RADIOACTIVE SEED LOCALIZATION Right 06/09/2015   Procedure: RIGHT BREAST LUMPECTOMY WITH RADIOACTIVE SEED LOCALIZATION;  Surgeon: Erroll Luna, MD;  Location: [facility];  Service: General;  Laterality: Right;   COLON SURGERY  07/17/2014   COLONOSCOPY     colonscopy      KNEE ARTHROSCOPY Right 2000   POLYPECTOMY  07/17/2014   polyp removed surgicallty    VAGINAL HYSTERECTOMY  2012    Eileen Holland Family History Is Significant  For: Family History  Problem Relation Age of Onset   Lung cancer Father 30       former smoker   Colon polyps Father        unspecified number   Breast cancer Sister 86       negative BRCA testing in 2007   Stomach cancer Maternal Uncle        dx. 50s-early 60s   Heart attack Paternal Uncle    Colon cancer Neg Hx    Esophageal cancer Neg Hx    Rectal cancer Neg Hx     Eileen Holland Social History Is Significant For: Social History   Socioeconomic History   Marital status: Single    Spouse name: Not on file   Number of children: Not on file   Years of education: Not on file   Highest education level: Not on file  Occupational History  Not on file  Social Needs   Financial resource strain: Not on file   Food insecurity:    Worry: Not on file    Inability: Not on file   Transportation needs:    Medical: Not on file    Non-medical: Not on file  Tobacco Use   Smoking status: Current Every Day Smoker    Packs/day: 0.50    Years: 32.00    Pack years: 16.00    Types: Cigarettes   Smokeless tobacco: Never Used  Substance and Sexual Activity   Alcohol use: Yes    Alcohol/week: 0.0 standard drinks    Comment: occas - maybe 1 drink every 3 mos   Drug use: No   Sexual activity: Not on file  Lifestyle   Physical activity:    Days per week: Not on file    Minutes per session: Not on file   Stress: Not on file  Relationships   Social connections:    Talks on phone: Not on file    Gets together: Not on file    Attends religious service: Not on file    Active member of club or organization: Not on file    Attends meetings of clubs or organizations: Not on file    Relationship status: Not on file  Other Topics Concern   Not on file  Social History Narrative   Not on file    Eileen Holland Allergies Are:  Allergies  Allergen Reactions   Aspirin Hives   Ibuprofen Hives  :   Eileen Holland Current Medications Are:  Outpatient Encounter Medications as of 12/24/2018    Medication Sig   ACETAMINOPHEN-BUTALBITAL 50-325 MG TABS Take 1 tablet by mouth every 4 (four) hours as needed (headache.).   amLODipine (NORVASC) 10 MG tablet Take 10 mg by mouth every evening. @'@7' :30 pm   B Complex Vitamins (B COMPLEX PO) Take 1 tablet by mouth daily at 12 noon. (B complex-C-E-zinc)   Cholecalciferol (VITAMIN D HIGH POTENCY) 1000 units capsule Take 1,000 Units daily by mouth.    docusate sodium (COLACE) 100 MG capsule Take 100 mg by mouth 2 (two) times daily.   hydrochlorothiazide 25 MG tablet Take 25 mg by mouth every morning.    ketoconazole (NIZORAL) 2 % cream Apply 1 application topically daily as needed for irritation.   letrozole (FEMARA) 2.5 MG tablet Take 1 tablet (2.5 mg total) by mouth daily.   levETIRAcetam (KEPPRA) 500 MG tablet Take 1 tablet (500 mg total) by mouth 2 (two) times daily.   levocetirizine (XYZAL) 5 MG tablet Take 5 mg by mouth daily at 12 noon. For allergies    losartan (COZAAR) 100 MG tablet Take 100 mg by mouth every morning.   metoprolol succinate (TOPROL-XL) 100 MG 24 hr tablet Take 100 mg by mouth every morning. Take with or immediately following a meal.   PARoxetine (PAXIL) 20 MG tablet Take 20 mg by mouth every morning.     pravastatin (PRAVACHOL) 20 MG tablet Take 20 mg by mouth daily.    Prenatal Vit-Fe Fumarate-FA (PRENATAL PO) Take 1 tablet by mouth daily at 12 noon.   No facility-administered encounter medications on file as of 12/24/2018.   :  Review of Systems:  Out of a complete 14 point review of systems, all are reviewed and negative with the exception of these symptoms as listed below:  Virtual Visit via Video Note on @ TODAY@  I connected with@ on 12/24/18 at  3:00 PM EDT by a video  enabled telemedicine application and verified that I am speaking with the correct person using two identifiers.   I discussed the limitations of evaluation and management by telemedicine and the availability of in person  appointments. The patient expressed understanding and agreed to proceed.  History of Present Illness: Eileen Holland reports doing well with the BiPAP machine but does have a tendency to open the mouth and chew on the lower part or bite down on the lower part of Eileen Holland fullface mask, Eileen Holland has a ResMed mask.  Eileen Holland likes the larger size but has to order 1.  Eileen Holland is currently using the medium size.  Eileen Holland has occasional mouth dryness.  Generally speaking Eileen Holland feels well and is noticing ongoing benefit with Eileen Holland BiPAP treatment including better blood pressure values.  Eileen Holland will be discussing with Eileen Holland primary care physician some down adjustments of Eileen Holland blood pressure medication even.  Eileen Holland is trying to lose weight.     Observations/Objective: There are no recent vital signs available for my review in Eileen Holland chart, the most recent vital signs available are from 07/23/2018. Eileen Holland most recent blood pressure today on Eileen Holland machine was 123/85 with a pulse of 68.  Eileen Holland most recent weight today by self-report was 150.2 pounds. On examination, Eileen Holland is pleasant and conversant in no acute distress, good comprehension and language skills.  Speech is clear without dysarthria, hypophonia or voice tremor noted.  Face is symmetric with normal facial animation and good extraocular movements, Eileen Holland is wearing corrective eyeglasses.  Airway examination reveals a stable findings, no significant mouth dryness, tongue protrudes centrally in palate elevates symmetrically.  Posture is normal, shoulder height equal.  Muscle bulk appears to be normal.  Eileen Holland stands up without difficulty, Eileen Holland walks without difficulty.  Assessment and Plan: In summary,Kamsiyochukwu Davisis a very pleasant 63 year oldfemalewith an underlying medical history of hypertension, hyperlipidemia, breast cancer, migraine headaches, depression, anxiety, recent diagnosis of seizure disorder and overweight state, who presents for a virtual, video based follow-up consultation of Eileen Holland obstructive sleep  apnea, On treatment with BiPAP.  Eileen Holland had a baseline sleep study in May 2019 and a subsequent titration study in June 2019, required BiPAP rather than plain CPAP.  Eileen Holland is fully compliant with treatment and highly commended for it.  Eileen Holland continues to benefit from it.  Eileen Holland apnea score is at goal, leak on the high side, likely due to mouth opening.  Eileen Holland would be willing to try a chinstrap which I will prescribe.  Otherwise, Eileen Holland is up-to-date with Eileen Holland supplies, is waiting to order a new larger size fullface mask. Eileen Holland is commended for Eileen Holland weight loss endeavors as well. Eileen Holland is advised to routinely follow-up in 1 year with a nurse practitioner.  I answered all Eileen Holland questions today and Eileen Holland was in agreement.  Follow Up Instructions: Continue BiPAP therapy with full compliance.  Eileen Holland is commended for treatment adherence.  Try a chinstrap, I will prescribe today.  Follow-up in 1 year, may see nurse practitioner.  Eileen Holland supplies are up-to-date, will order supplies and chinstrap through DME provider.   I discussed the assessment and treatment plan with the patient. The patient was provided an opportunity to ask questions and all were answered. The patient agreed with the plan and demonstrated an understanding of the instructions.   The patient was advised to call back or seek an in-person evaluation if the symptoms worsen or if the condition fails to improve as anticipated.  I provided 15 minutes of non-face-to-face time during this encounter.  Star Age, MD

## 2018-12-24 NOTE — Patient Instructions (Signed)
Given verbally, during today's virtual video-based encounter, with verbal feedback received.   

## 2018-12-26 ENCOUNTER — Telehealth: Payer: Self-pay

## 2018-12-26 NOTE — Telephone Encounter (Signed)
I called pt, scheduled her yearly follow up. Pt verbalized understanding of new appt date and time.

## 2018-12-27 ENCOUNTER — Ambulatory Visit: Payer: Managed Care, Other (non HMO) | Admitting: Neurology

## 2019-07-01 ENCOUNTER — Other Ambulatory Visit: Payer: Self-pay | Admitting: Diagnostic Neuroimaging

## 2019-07-01 DIAGNOSIS — C50411 Malignant neoplasm of upper-outer quadrant of right female breast: Secondary | ICD-10-CM

## 2019-07-03 ENCOUNTER — Other Ambulatory Visit: Payer: Self-pay

## 2019-07-03 DIAGNOSIS — C50411 Malignant neoplasm of upper-outer quadrant of right female breast: Secondary | ICD-10-CM

## 2019-07-04 ENCOUNTER — Encounter: Payer: Self-pay | Admitting: Hematology & Oncology

## 2019-07-04 ENCOUNTER — Other Ambulatory Visit: Payer: Self-pay

## 2019-07-04 ENCOUNTER — Ambulatory Visit: Payer: Managed Care, Other (non HMO) | Admitting: Hematology & Oncology

## 2019-07-04 ENCOUNTER — Other Ambulatory Visit: Payer: Managed Care, Other (non HMO)

## 2019-07-04 ENCOUNTER — Inpatient Hospital Stay (HOSPITAL_BASED_OUTPATIENT_CLINIC_OR_DEPARTMENT_OTHER): Payer: BC Managed Care – PPO | Admitting: Hematology & Oncology

## 2019-07-04 ENCOUNTER — Inpatient Hospital Stay: Payer: BC Managed Care – PPO | Attending: Hematology & Oncology

## 2019-07-04 VITALS — BP 141/94 | HR 57 | Temp 97.1°F | Resp 18 | Wt 150.5 lb

## 2019-07-04 DIAGNOSIS — D0511 Intraductal carcinoma in situ of right breast: Secondary | ICD-10-CM | POA: Diagnosis not present

## 2019-07-04 DIAGNOSIS — C50411 Malignant neoplasm of upper-outer quadrant of right female breast: Secondary | ICD-10-CM

## 2019-07-04 DIAGNOSIS — Z170 Estrogen receptor positive status [ER+]: Secondary | ICD-10-CM | POA: Insufficient documentation

## 2019-07-04 DIAGNOSIS — Z720 Tobacco use: Secondary | ICD-10-CM

## 2019-07-04 DIAGNOSIS — F17210 Nicotine dependence, cigarettes, uncomplicated: Secondary | ICD-10-CM | POA: Diagnosis not present

## 2019-07-04 DIAGNOSIS — Z79811 Long term (current) use of aromatase inhibitors: Secondary | ICD-10-CM | POA: Diagnosis not present

## 2019-07-04 LAB — CMP (CANCER CENTER ONLY)
ALT: 15 U/L (ref 0–44)
AST: 19 U/L (ref 15–41)
Albumin: 4.5 g/dL (ref 3.5–5.0)
Alkaline Phosphatase: 72 U/L (ref 38–126)
Anion gap: 6 (ref 5–15)
BUN: 8 mg/dL (ref 6–20)
CO2: 32 mmol/L (ref 22–32)
Calcium: 10.1 mg/dL (ref 8.9–10.3)
Chloride: 102 mmol/L (ref 98–111)
Creatinine: 0.96 mg/dL (ref 0.44–1.00)
GFR, Est AFR Am: >60 mL/min (ref >60)
GFR, Estimated: >60 mL/min (ref >60)
Glucose, Bld: 120 mg/dL — ABNORMAL HIGH (ref 70–99)
Potassium: 4.2 mmol/L (ref 3.5–5.1)
Sodium: 140 mmol/L (ref 135–145)
Total Bilirubin: 0.5 mg/dL (ref 0.3–1.2)
Total Protein: 7.2 g/dL (ref 6.5–8.1)

## 2019-07-04 LAB — CBC WITH DIFFERENTIAL (CANCER CENTER ONLY)
Abs Immature Granulocytes: 0.04 10*3/uL (ref 0.00–0.07)
Basophils Absolute: 0 10*3/uL (ref 0.0–0.1)
Basophils Relative: 0 %
Eosinophils Absolute: 0.2 10*3/uL (ref 0.0–0.5)
Eosinophils Relative: 2 %
HCT: 39.1 % (ref 36.0–46.0)
Hemoglobin: 13.1 g/dL (ref 12.0–15.0)
Immature Granulocytes: 0 %
Lymphocytes Relative: 23 %
Lymphs Abs: 2 10*3/uL (ref 0.7–4.0)
MCH: 35.1 pg — ABNORMAL HIGH (ref 26.0–34.0)
MCHC: 33.5 g/dL (ref 30.0–36.0)
MCV: 104.8 fL — ABNORMAL HIGH (ref 80.0–100.0)
Monocytes Absolute: 0.6 10*3/uL (ref 0.1–1.0)
Monocytes Relative: 7 %
Neutro Abs: 6.1 10*3/uL (ref 1.7–7.7)
Neutrophils Relative %: 68 %
Platelet Count: 228 10*3/uL (ref 150–400)
RBC: 3.73 MIL/uL — ABNORMAL LOW (ref 3.87–5.11)
RDW: 11.3 % — ABNORMAL LOW (ref 11.5–15.5)
WBC Count: 9 10*3/uL (ref 4.0–10.5)
nRBC: 0 % (ref 0.0–0.2)

## 2019-07-04 LAB — VITAMIN D 25 HYDROXY (VIT D DEFICIENCY, FRACTURES): Vit D, 25-Hydroxy: 34.34 ng/mL (ref 30–100)

## 2019-07-04 MED ORDER — LETROZOLE 2.5 MG PO TABS: 2.5000 mg | ORAL_TABLET | Freq: Every day | ORAL | 2 refills | Status: DC

## 2019-07-04 NOTE — Progress Notes (Signed)
Hematology and Oncology Follow Up Visit  Eileen Holland [id number] 1960-11-23 58 y.o. 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 daily     Interim History:  Eileen Holland is back for a long awaited visit.  We last saw her back in August 2018.  Not sure as to why she did not make any follow-up appointment with [place].  She says that she is running out of the Femara.  She feels bad that she has not seen [place].  She had her lumpectomy back in October 2016.  She is doing pretty well.  She really has had no specific complaints.  She is due for a mammogram.  She has had no problems with bleeding or bruising.  There is no change in bowel or bladder habits.  She has had no issues with nausea or vomiting.  There is been no headache.  Overall, I would say her performance status is ECOG 1.  Medications:  Current Outpatient Medications:  .  ACETAMINOPHEN-BUTALBITAL 50-325 MG TABS, Take 1 tablet by mouth every 4 (four) hours as needed (headache.)., Disp: , Rfl:  .  ALPRAZolam (XANAX) 0.5 MG tablet, Take by mouth., Disp: , Rfl:  .  amLODipine (NORVASC) 10 MG tablet, Take 10 mg by mouth every evening. @@7 :30 pm, Disp: , Rfl:  .  B Complex Vitamins (B COMPLEX PO), Take 1 tablet by mouth daily at 12 noon. (B complex-C-E-zinc), Disp: , Rfl:  .  Cholecalciferol (VITAMIN D HIGH POTENCY) 1000 units capsule, Take 1,000 Units daily by mouth. , Disp: , Rfl:  .  docusate sodium (COLACE) 100 MG capsule, Take 100 mg by mouth 2 (two) times daily., Disp: , Rfl:  .  hydrochlorothiazide 25 MG tablet, Take 25 mg by mouth every morning. , Disp: , Rfl:  .  ketoconazole (NIZORAL) 2 % cream, Apply 1 application topically daily as needed for irritation., Disp: , Rfl:  .  letrozole (FEMARA) 2.5 MG tablet, Take 1 tablet (2.5 mg total) by mouth daily., Disp: 90 tablet, Rfl: 2 .  levETIRAcetam (KEPPRA) 500 MG tablet, Take 1 tablet (500 mg total) by mouth 2 (two) times  daily., Disp: 180 tablet, Rfl: 4 .  levocetirizine (XYZAL) 5 MG tablet, Take 5 mg by mouth daily at 12 noon. For allergies , Disp: , Rfl:  .  losartan (COZAAR) 100 MG tablet, Take 100 mg by mouth every morning., Disp: , Rfl:  .  metoprolol succinate (TOPROL-XL) 100 MG 24 hr tablet, Take 100 mg by mouth every morning. Take with or immediately following a meal., Disp: , Rfl:  .  PARoxetine (PAXIL) 20 MG tablet, Take 20 mg by mouth every morning.  , Disp: , Rfl:  .  pravastatin (PRAVACHOL) 20 MG tablet, Take 20 mg by mouth daily. , Disp: , Rfl:  .  pravastatin (PRAVACHOL) 40 MG tablet, Take by mouth., Disp: , Rfl:  .  Prenatal Vit-Fe Fumarate-FA (PRENATAL PO), Take 1 tablet by mouth daily at 12 noon., Disp: , Rfl:   Allergies:  Allergies  Allergen Reactions  . Aspirin Hives  . Ibuprofen Hives    Past Medical History, Surgical history, Social history, and Family History were reviewed and updated.  Review of Systems: Review of Systems  Constitutional: Negative.   HENT:  Negative.   Eyes: Negative.   Respiratory: Negative.   Cardiovascular: Negative.   Gastrointestinal: Negative.   Endocrine: Negative.   Genitourinary: Negative.  Musculoskeletal: Negative.   Skin: Negative.   Neurological: Negative.   Hematological: Negative.   Psychiatric/Behavioral: Negative.     Physical Exam:  weight is 150 lb 8 oz (68.3 kg). Her temporal temperature is 97.1 F (36.2 C) (abnormal). Her blood pressure is 141/94 (abnormal) and her pulse is 57 (abnormal). Her respiration is 18 and oxygen saturation is 98%.   Wt Readings from Last 3 Encounters:  07/04/19 150 lb 8 oz (68.3 kg)  07/23/18 164 lb (74.4 kg)  06/27/18 164 lb (74.4 kg)    Physical Exam Vitals signs reviewed.  Constitutional:      Comments: Breast exam shows left breast with no masses, edema or erythema.  There is no left axillary adenopathy.  Right breast shows a well-healed lumpectomy at about the 10 o'clock position.  This is  without erythema or warmth or tenderness.  There is no right axillary adenopathy.  There is no right nipple discharge.  HENT:     Head: Normocephalic and atraumatic.  Eyes:     Pupils: Pupils are equal, round, and reactive to light.  Neck:     Musculoskeletal: Normal range of motion.  Cardiovascular:     Rate and Rhythm: Normal rate and regular rhythm.     Heart sounds: Normal heart sounds.  Pulmonary:     Effort: Pulmonary effort is normal.     Breath sounds: Normal breath sounds.  Abdominal:     General: Bowel sounds are normal.     Palpations: Abdomen is soft.  Musculoskeletal: Normal range of motion.        General: No tenderness or deformity.  Lymphadenopathy:     Cervical: No cervical adenopathy.  Skin:    General: Skin is warm and dry.     Findings: No erythema or rash.  Neurological:     Mental Status: She is alert and oriented to person, place, and time.  Psychiatric:        Behavior: Behavior normal.        Thought Content: Thought content normal.        Judgment: Judgment normal.      Lab Results  Component Value Date   WBC 9.0 07/04/2019   HGB 13.1 07/04/2019   HCT 39.1 07/04/2019   MCV 104.8 (H) 07/04/2019   PLT 228 07/04/2019     Chemistry      Component Value Date/Time   NA 140 07/04/2019 1045   NA 139 03/28/2017 1034   NA 138 09/27/2016 0820   K 4.2 07/04/2019 1045   K 4.0 03/28/2017 1034   K 3.5 09/27/2016 0820   CL 102 07/04/2019 1045   CL 103 03/28/2017 1034   CO2 32 07/04/2019 1045   CO2 30 03/28/2017 1034   CO2 27 09/27/2016 0820   BUN 8 07/04/2019 1045   BUN 11 03/28/2017 1034   BUN 8.6 09/27/2016 0820   CREATININE 0.96 07/04/2019 1045   CREATININE 1.1 03/28/2017 1034   CREATININE 0.9 09/27/2016 0820      Component Value Date/Time   CALCIUM 10.1 07/04/2019 1045   CALCIUM 9.8 03/28/2017 1034   CALCIUM 9.8 09/27/2016 0820   ALKPHOS 72 07/04/2019 1045   ALKPHOS 81 03/28/2017 1034   ALKPHOS 80 09/27/2016 0820   AST 19 07/04/2019  1045   AST 20 09/27/2016 0820   ALT 15 07/04/2019 1045   ALT 21 03/28/2017 1034   ALT 16 09/27/2016 0820   BILITOT 0.5 07/04/2019 1045   BILITOT 0.43 09/27/2016 0820  Impression and Plan: Eileen Holland is a 58 year old African-American female.  She is post menopausal.  She is 4 years out from her lumpectomy.  I do not see any issues at all with respect to recurrence.  She is going to have her mammogram done.  I forgot to mention that she does smoke.  She smokes a pack per day.  She is going to have to be careful and cognizant of lung cancer.  Her last chest x-ray was back in March 2019.  I think we can probably have her come back in 1 year.  I think this would be reasonable.  I think when we do see her back, I would consider a chest x-ray on her.  It was nice to see her again.  She certainly does look okay.   Volanda Napoleon, MD 11/19/20205:07 PM

## 2019-07-24 ENCOUNTER — Other Ambulatory Visit: Payer: Managed Care, Other (non HMO)

## 2019-07-24 ENCOUNTER — Ambulatory Visit: Payer: Managed Care, Other (non HMO) | Admitting: Hematology & Oncology

## 2019-07-28 NOTE — Patient Instructions (Signed)
Seizure, Adult A seizure is a sudden burst of abnormal electrical activity in the brain. Seizures usually last from 30 seconds to 2 minutes. They can cause many different symptoms. Usually, seizures are not harmful unless they last a long time. What are the causes? Common causes of this condition include:  Fever or infection.  Conditions that affect the brain, such as: ? A brain abnormality that you were born with. ? A brain or head injury. ? Bleeding in the brain. ? A tumor. ? Stroke. ? Brain disorders such as autism or cerebral palsy.  Low blood sugar.  Conditions that are passed from parent to child (are inherited).  Problems with substances, such as: ? Having a reaction to a drug or a medicine. ? Suddenly stopping the use of a substance (withdrawal). In some cases, the cause may not be known. A person who has repeated seizures over time without a clear cause has a condition called epilepsy. What increases the risk? You are more likely to get this condition if you have:  A family history of epilepsy.  Had a seizure in the past.  A brain disorder.  A history of head injury, lack of oxygen at birth, or strokes. What are the signs or symptoms? There are many types of seizures. The symptoms vary depending on the type of seizure you have. Examples of symptoms during a seizure include:  Shaking (convulsions).  Stiffness in the body.  Passing out (losing consciousness).  Head nodding.  Staring.  Not responding to sound or touch.  Loss of bladder control and bowel control. Some people have symptoms right before and right after a seizure happens. Symptoms before a seizure may include:  Fear.  Worry (anxiety).  Feeling like you may vomit (nauseous).  Feeling like the room is spinning (vertigo).  Feeling like you saw or heard something before (dj vu).  Odd tastes or smells.  Changes in how you see. You may see flashing lights or spots. Symptoms after a  seizure happens can include:  Confusion.  Sleepiness.  Headache.  Weakness on one side of the body. How is this treated? Most seizures will stop on their own in under 5 minutes. In these cases, no treatment is needed. Seizures that last longer than 5 minutes will usually need treatment. Treatment can include:  Medicines given through an IV tube.  Avoiding things that are known to cause your seizures. These can include medicines that you take for another condition.  Medicines to treat epilepsy.  Surgery to stop the seizures. This may be needed if medicines do not help. Follow these instructions at home: Medicines  Take over-the-counter and prescription medicines only as told by your doctor.  Do not eat or drink anything that may keep your medicine from working, such as alcohol. Activity  Do not do any activities that would be dangerous if you had another seizure, like driving or swimming. Wait until your doctor says it is safe for you to do them.  If you live in the [place] ask your local DMV (department of motor vehicles) when you can drive.  Get plenty of rest. Teaching others Teach friends and family what to do when you have a seizure. They should:  Lay you on the ground.  Protect your head and body.  Loosen any tight clothing around your neck.  Turn you on your side.  Not hold you down.  Not put anything into your mouth.  Know whether or not you need emergency care.  Stay   with you until you are better.  General instructions  Contact your doctor each time you have a seizure.  Avoid anything that gives you seizures.  Keep a seizure diary. Write down: ? What you think caused each seizure. ? What you remember about each seizure.  Keep all follow-up visits as told by your doctor. This is important. Contact a doctor if:  You have another seizure.  You have seizures more often.  There is any change in what happens during your seizures.  You keep having  seizures with treatment.  You have symptoms of being sick or having an infection. Get help right away if:  You have a seizure that: ? Lasts longer than 5 minutes. ? Is different than seizures you had before. ? Makes it harder to breathe. ? Happens after you hurt your head.  You have any of these symptoms after a seizure: ? Not being able to speak. ? Not being able to use a part of your body. ? Confusion. ? A bad headache.  You have two or more seizures in a row.  You do not wake up right after a seizure.  You get hurt during a seizure. These symptoms may be an emergency. Do not wait to see if the symptoms will go away. Get medical help right away. Call your local emergency services (911 in the [place] Do not drive yourself to the hospital. Summary  Seizures usually last from 30 seconds to 2 minutes. Usually, they are not harmful unless they last a long time.  Do not eat or drink anything that may keep your medicine from working, such as alcohol.  Teach friends and family what to do when you have a seizure.  Contact your doctor each time you have a seizure. This information is not intended to replace advice given to you by your health care provider. Make sure you discuss any questions you have with your health care provider. Document Released: 01/18/2008 Document Revised: 10/19/2018 Document Reviewed: 10/19/2018 Elsevier Patient Education  2020 Elsevier Inc.  

## 2019-07-28 NOTE — Progress Notes (Addendum)
PATIENT: Arcenia Curvin DOB: 07-07-1961  REASON FOR VISIT: follow up HISTORY FROM: patient  Virtual Visit via Telephone Note  I connected with [name] on 07/29/19 at  3:30 PM EST by telephone and verified that I am speaking with the correct person using two identifiers.   I discussed the limitations, risks, security and privacy concerns of performing an evaluation and management service by telephone and the availability of in person appointments. I also discussed with the patient that there may be a patient responsible charge related to this service. The patient expressed understanding and agreed to proceed.   History of Present Illness:  07/29/19 Kersha Hainer is a 58 y.o. female here today for follow up  for seizure. She continues levetiracetam 500mg  twice daily.  She is tolerating medication well with no obvious adverse effects.  She denies any further seizure activity.  She is followed by Dr Rexene Alberts for OSA on BiPAP. She was last seen in 04/2019 with follow up recommended in 1 year.  Compliance reports reviewed today for dates 06/29/2019 through 07/28/2019 reveals excellent compliance.  She is using BiPAP therapy every day.  29 of the last 30 days she used BiPAP therapy greater than 4 hours.  On average 7 hours and 5 minutes.  Residual AHI was 0.2 with IPAP of 21 cm of water and EPAP of 16 cm of water.  Pressure support of 4 cm of water.  She continues to have an elevated leak in the 95th percentile of 95.2.  She has had multiple mask refitting's but feels that current mask is the best option for her.  She feels that leak is related to restless sleeping.  She will continue to be mindful of a leak and ensure that mask and headgear are well fitting.   HISTORY: (copied from Dr Gladstone Lighter note on 07/23/2018)  UPDATE (07/23/18, VRP): Since last visit, doing well. Symptoms are are resolved. No seizures. No alleviating or aggravating factors. Tolerating levetiracetam 500mg  twice a day.    PRIOR  HPI (11/01/17): 58 year old female with history of breast cancer, hypertension, hyperlipidemia, presented to hospital on 10/26/17 with confusion, excessive aphasia, tongue bite.  Patient may have had the seizure the night before in her sleep.  Patient was admitted for workup.  MRI of the brain, CTA of the head and neck were unremarkable.  EEG showed left frontotemporal epileptiform discharges.  Patient was started on levetiracetam 500 mg twice a day.  Since that time patient has done well.  No further events.  No prior history of seizures.  No prior history of head trauma, meningitis or encephalitis.  No family history of seizure.   Observations/Objective:  Generalized: Well developed, in no acute distress  Mentation: Alert oriented to time, place, history taking. Follows all commands speech and language fluent   Assessment and Plan:  58 y.o. year old female  has a past medical history of Allergy, Anxiety, Breast calcification, right, Cancer ([place]), Constipation, Depression, Headache, Headache, migraine (07/04/2014), High cholesterol, Hyperlipidemia, Hypertension, Personal history of radiation therapy (2016), Radiation (07/13/15-08/03/15), and Seizures ([place]) (10/26/2017). here with    ICD-10-CM   1. Seizure disorder ([place])  G40.909   2. OSA treated with BiPAP  G47.33    Jozlin is doing very well.  Seizures are well managed on levetiracetam 500 mg twice daily.  Recent blood work with oncology was normal.  Compliance report reveals excellent compliance.  We have discussed the concerns of increased leak.  She has tried multiple masks but feels that  she is doing well at this time.  Residual AHI 0.2.  She will continue to monitor fit and tightness of mask and headgear as well as events noted on her BiPAP machine at home.  She was instructed to call should apneic events increase.  She was encouraged to continue BiPAP therapy nightly and for greater than 4 hours each night.  She will follow-up with [place] in 1  year, sooner if needed.  She verbalizes understanding and agreement with this plan.   No orders of the defined types were placed in this encounter.   No orders of the defined types were placed in this encounter.    Follow Up Instructions:  I discussed the assessment and treatment plan with the patient. The patient was provided an opportunity to ask questions and all were answered. The patient agreed with the plan and demonstrated an understanding of the instructions.   The patient was advised to call back or seek an in-person evaluation if the symptoms worsen or if the condition fails to improve as anticipated.  I provided 15 minutes of non-face-to-face time during this encounter.  Patient located at her place of residence during [place].  My chart visit unable to be completed due to technological concerns.  Provider is in the office.   Debbora Presto, NP   I reviewed the above note and documentation by the Nurse Practitioner and agree with the history, exam, assessment and plan as outlined above. I was available for consultation. Star Age, MD, PhD [facility])

## 2019-07-29 ENCOUNTER — Other Ambulatory Visit: Payer: Self-pay

## 2019-07-29 ENCOUNTER — Encounter: Payer: Self-pay | Admitting: Family Medicine

## 2019-07-29 ENCOUNTER — Telehealth (INDEPENDENT_AMBULATORY_CARE_PROVIDER_SITE_OTHER): Payer: Self-pay | Admitting: Family Medicine

## 2019-07-29 ENCOUNTER — Other Ambulatory Visit: Payer: Self-pay | Admitting: Diagnostic Neuroimaging

## 2019-07-29 DIAGNOSIS — G40909 Epilepsy, unspecified, not intractable, without status epilepticus: Secondary | ICD-10-CM

## 2019-07-29 DIAGNOSIS — G4733 Obstructive sleep apnea (adult) (pediatric): Secondary | ICD-10-CM

## 2019-08-01 NOTE — Progress Notes (Signed)
I reviewed note and agree with plan.   Penni Bombard, MD [id number], 0000000 PM Certified in Neurology, Neurophysiology and Neuroimaging  [facility] [address], [place], [place] [address] [phone]

## 2019-12-23 ENCOUNTER — Encounter: Payer: Self-pay | Admitting: Adult Health

## 2019-12-25 ENCOUNTER — Telehealth (INDEPENDENT_AMBULATORY_CARE_PROVIDER_SITE_OTHER): Payer: Self-pay | Admitting: Adult Health

## 2019-12-25 ENCOUNTER — Encounter: Payer: Self-pay | Admitting: Adult Health

## 2019-12-25 DIAGNOSIS — G40909 Epilepsy, unspecified, not intractable, without status epilepticus: Secondary | ICD-10-CM

## 2019-12-25 DIAGNOSIS — G4733 Obstructive sleep apnea (adult) (pediatric): Secondary | ICD-10-CM

## 2019-12-25 NOTE — Progress Notes (Addendum)
PATIENT: Eileen Holland DOB: June 03, 1961  REASON FOR VISIT: follow up HISTORY FROM: patient  Virtual Visit via Video Note  I connected with Eileen Holland on 12/25/19 at  3:00 PM EDT by a video enabled telemedicine application located remotely at [facility] and verified that I am speaking with the correct person using two identifiers who was located at their own home.   I discussed the limitations of evaluation and management by telemedicine and the availability of in person appointments. The patient expressed understanding and agreed to proceed.   PATIENT: Eileen Holland DOB: Sep 14, 1960  REASON FOR VISIT: follow up HISTORY FROM: patient  HISTORY OF PRESENT ILLNESS: Today 12/25/19:  [name] is a 59 year old female with a history of obstructive sleep apnea on CPAP.  Her download indicates that she use her machine nightly for compliance of 100%.  She has her machine greater than 4 hours each night.  On average she uses her machine 7 hours and 7 minutes.  Her residual AHI is 0.5 on 21/16 centimeters of water with pressure support of 4 cm of water.  Her leak in the 95th percentile is 90.7 L/min.  Her leak has remained high despite several mask refitting's.  She is currently happy with her current mask.  Seizures: Denies any seizure events.  Continues on Keppra 500 mg twice a day  HISTORY ( Copied from Debbora Presto NP): 07/29/19 Eileen Holland is a 59 y.o. female here today for follow up  for seizure. She continues levetiracetam 558m twice daily.  She is tolerating medication well with no obvious adverse effects.  She denies any further seizure activity.  She is followed by Dr ARexene Albertsfor OSA on BiPAP. She was last seen in 04/2019 with follow up recommended in 1 year.  Compliance reports reviewed today for dates 06/29/2019 through 07/28/2019 reveals excellent compliance.  She is using BiPAP therapy every day.  29 of the last 30 days she used BiPAP therapy greater than 4 hours.  On  average 7 hours and 5 minutes.  Residual AHI was 0.2 with IPAP of 21 cm of water and EPAP of 16 cm of water.  Pressure support of 4 cm of water.  She continues to have an elevated leak in the 95th percentile of 95.2.  She has had multiple mask refitting's but feels that current mask is the best option for her.  She feels that leak is related to restless sleeping.  She will continue to be mindful of a leak and ensure that mask and headgear are well fitting.   REVIEW OF SYSTEMS: Out of a complete 14 system review of symptoms, the patient complains only of the following symptoms, and all other reviewed systems are negative.  See HPI  ALLERGIES: Allergies  Allergen Reactions   Aspirin Hives   Ibuprofen Hives    HOME MEDICATIONS: Outpatient Medications Prior to Visit  Medication Sig Dispense Refill   ACETAMINOPHEN-BUTALBITAL 50-325 MG TABS Take 1 tablet by mouth every 4 (four) hours as needed (headache.).     ALPRAZolam (XANAX) 0.5 MG tablet Take by mouth.     amLODipine (NORVASC) 10 MG tablet Take 10 mg by mouth every evening. @'@7' :30 pm     B Complex Vitamins (B COMPLEX PO) Take 1 tablet by mouth daily at 12 noon. (B complex-C-E-zinc)     Cholecalciferol (VITAMIN D HIGH POTENCY) 1000 units capsule Take 1,000 Units daily by mouth.      docusate sodium (COLACE) 100 MG capsule Take 100  mg by mouth 2 (two) times daily.     hydrochlorothiazide 25 MG tablet Take 25 mg by mouth every morning.      ketoconazole (NIZORAL) 2 % cream Apply 1 application topically daily as needed for irritation.     letrozole (FEMARA) 2.5 MG tablet Take 1 tablet (2.5 mg total) by mouth daily. 90 tablet 2   levETIRAcetam (KEPPRA) 500 MG tablet TAKE ONE TABLET BY MOUTH TWICE DAILY 180 tablet 3   levocetirizine (XYZAL) 5 MG tablet Take 5 mg by mouth daily at 12 noon. For allergies      losartan (COZAAR) 100 MG tablet Take 100 mg by mouth every morning.     metoprolol succinate (TOPROL-XL) 100 MG 24 hr  tablet Take 100 mg by mouth every morning. Take with or immediately following a meal.     PARoxetine (PAXIL) 20 MG tablet Take 20 mg by mouth every morning.       pravastatin (PRAVACHOL) 20 MG tablet Take 20 mg by mouth daily.      pravastatin (PRAVACHOL) 40 MG tablet Take by mouth.     Prenatal Vit-Fe Fumarate-FA (PRENATAL PO) Take 1 tablet by mouth daily at 12 noon.     No facility-administered medications prior to visit.    PAST MEDICAL HISTORY: Past Medical History:  Diagnosis Date   Allergy    Anxiety    panic attacks per PCP history   Breast calcification, right    Cancer ([place])    right BR CA  with lumpectomy with radiation, no chemo - radiation in 2016   Constipation    chronic- use stool softener and OTC fiber and prn miralax    Depression    Headache    hx of per PCP history   Headache, migraine 07/04/2014   Last Assessment & Plan:  Refilled butalbital for rare use, once monthly as cannot take NSAIDs.  Advised if needs it more regularly, to follow-up and discuss management furtehr    High cholesterol    Hyperlipidemia    Hypertension    Personal history of radiation therapy 2016   Radiation 07/13/15-08/03/15   right breast 4256 cGy   Seizures ([place]) 10/26/2017    PAST SURGICAL HISTORY: Past Surgical History:  Procedure Laterality Date   BREAST BIOPSY Right 04/28/2015   BREAST LUMPECTOMY Right 06/09/2015   BREAST LUMPECTOMY WITH RADIOACTIVE SEED LOCALIZATION Right 06/09/2015   Procedure: RIGHT BREAST LUMPECTOMY WITH RADIOACTIVE SEED LOCALIZATION;  Surgeon: Erroll Luna, MD;  Location: [place];  Service: General;  Laterality: Right;   COLON SURGERY  07/17/2014   COLONOSCOPY     colonscopy      KNEE ARTHROSCOPY Right 2000   POLYPECTOMY  07/17/2014   polyp removed surgicallty    VAGINAL HYSTERECTOMY  2012    FAMILY HISTORY: Family History  Problem Relation Age of Onset   Lung cancer Father 42       former smoker    Colon polyps Father        unspecified number   Breast cancer Sister 56       negative BRCA testing in 2007   Stomach cancer Maternal Uncle        dx. 50s-early 60s   Heart attack Paternal Uncle    Colon cancer Neg Hx    Esophageal cancer Neg Hx    Rectal cancer Neg Hx     SOCIAL HISTORY: Social History   Socioeconomic History   Marital status: Single    Spouse name:  Not on file   Number of children: Not on file   Years of education: Not on file   Highest education level: Not on file  Occupational History   Not on file  Tobacco Use   Smoking status: Current Every Day Smoker    Packs/day: 0.50    Years: 32.00    Pack years: 16.00    Types: Cigarettes   Smokeless tobacco: Never Used  Substance and Sexual Activity   Alcohol use: Yes    Alcohol/week: 0.0 standard drinks    Comment: occas - maybe 1 drink every 3 mos   Drug use: No   Sexual activity: Not on file  Other Topics Concern   Not on file  Social History Narrative   Not on file   Social Determinants of Health   Financial Resource Strain:    Difficulty of Paying Living Expenses:   Food Insecurity:    Worried About Charity fundraiser in the Last Year:    Arboriculturist in the Last Year:   Transportation Needs:    Film/video editor (Medical):    Lack of Transportation (Non-Medical):   Physical Activity:    Days of Exercise per Week:    Minutes of Exercise per Session:   Stress:    Feeling of Stress :   Social Connections:    Frequency of Communication with Friends and Family:    Frequency of Social Gatherings with Friends and Family:    Attends Religious Services:    Active Member of Clubs or Organizations:    Attends Music therapist:    Marital Status:   Intimate Partner Violence:    Fear of Current or Ex-Partner:    Emotionally Abused:    Physically Abused:    Sexually Abused:       PHYSICAL EXAM Generalized: Well developed, in no  acute distress   Neurological examination  Mentation: Alert oriented to time, place, history taking. Follows all commands speech and language fluent Cranial nerve II-XII:Extraocular movements were full. Facial symmetry noted. uvula tongue midline. Head turning and shoulder shrug  were normal and symmetric. Motor: Good strength throughout subjectively per patient Sensory: Sensory testing is intact to soft touch on all 4 extremities subjectively per patient Coordination: Cerebellar testing reveals good finger-nose-finger  Gait and station: Patient is able to stand from a seated position. gait is normal.  Reflexes: UTA  DIAGNOSTIC DATA (LABS, IMAGING, TESTING) - I reviewed patient records, labs, notes, testing and imaging myself where available.  Lab Results  Component Value Date   WBC 9.0 07/04/2019   HGB 13.1 07/04/2019   HCT 39.1 07/04/2019   MCV 104.8 (H) 07/04/2019   PLT 228 07/04/2019      Component Value Date/Time   NA 140 07/04/2019 1045   NA 139 03/28/2017 1034   NA 138 09/27/2016 0820   K 4.2 07/04/2019 1045   K 4.0 03/28/2017 1034   K 3.5 09/27/2016 0820   CL 102 07/04/2019 1045   CL 103 03/28/2017 1034   CO2 32 07/04/2019 1045   CO2 30 03/28/2017 1034   CO2 27 09/27/2016 0820   GLUCOSE 120 (H) 07/04/2019 1045   GLUCOSE 110 03/28/2017 1034   BUN 8 07/04/2019 1045   BUN 11 03/28/2017 1034   BUN 8.6 09/27/2016 0820   CREATININE 0.96 07/04/2019 1045   CREATININE 1.1 03/28/2017 1034   CREATININE 0.9 09/27/2016 0820   CALCIUM 10.1 07/04/2019 1045   CALCIUM 9.8 03/28/2017 1034  CALCIUM 9.8 09/27/2016 0820   PROT 7.2 07/04/2019 1045   PROT 8.0 03/28/2017 1034   PROT 7.6 09/27/2016 0820   ALBUMIN 4.5 07/04/2019 1045   ALBUMIN 3.8 03/28/2017 1034   ALBUMIN 3.8 09/27/2016 0820   AST 19 07/04/2019 1045   AST 20 09/27/2016 0820   ALT 15 07/04/2019 1045   ALT 21 03/28/2017 1034   ALT 16 09/27/2016 0820   ALKPHOS 72 07/04/2019 1045   ALKPHOS 81 03/28/2017 1034    ALKPHOS 80 09/27/2016 0820   BILITOT 0.5 07/04/2019 1045   BILITOT 0.43 09/27/2016 0820   GFRNONAA >60 07/04/2019 1045   GFRAA >60 07/04/2019 1045   Lab Results  Component Value Date   CHOL 173 10/27/2017   HDL 61 10/27/2017   LDLCALC 86 10/27/2017   TRIG 132 10/27/2017   CHOLHDL 2.8 10/27/2017   Lab Results  Component Value Date   HGBA1C 4.7 (L) 10/27/2017      ASSESSMENT AND PLAN 59 y.o. year old female  has a past medical history of Allergy, Anxiety, Breast calcification, right, Cancer ([place]), Constipation, Depression, Headache, Headache, migraine (07/04/2014), High cholesterol, Hyperlipidemia, Hypertension, Personal history of radiation therapy (2016), Radiation (07/13/15-08/03/15), and Seizures ([place]) (10/26/2017). here with:  1.  Obstructive sleep apnea on CPAP  -CPAP download shows good compliance -Good treatment of apnea -Leak has remained high despite several mask refitting's  2.  Seizures  -Continue Keppra 500 mg twice a day  Advised if symptoms worsen or she develops new symptoms she should let us know follow-up in 6 months or sooner if needed  I spent 20 minutes of face-to-face and non-face-to-face time with patient.  This included previsit chart review, lab review, study review, order entry, electronic health record documentation, patient education.   Ward Givens, MSN, NP-C 12/25/2019, 1:31 PM [facility] [address] [place], [place] [address] [phone]   I reviewed the above note and documentation by the Nurse Practitioner and agree with the history, exam, assessment and plan as outlined above. I was available for consultation. Star Age, MD, PhD [facility])

## 2019-12-30 ENCOUNTER — Other Ambulatory Visit: Payer: Self-pay | Admitting: Family

## 2019-12-30 DIAGNOSIS — Z853 Personal history of malignant neoplasm of breast: Secondary | ICD-10-CM

## 2019-12-30 DIAGNOSIS — Z9889 Other specified postprocedural states: Secondary | ICD-10-CM

## 2020-01-01 ENCOUNTER — Other Ambulatory Visit: Payer: Self-pay | Admitting: Hematology & Oncology

## 2020-01-01 DIAGNOSIS — C50411 Malignant neoplasm of upper-outer quadrant of right female breast: Secondary | ICD-10-CM

## 2020-01-14 ENCOUNTER — Other Ambulatory Visit: Payer: Self-pay

## 2020-01-14 ENCOUNTER — Ambulatory Visit
Admission: RE | Admit: 2020-01-14 | Discharge: 2020-01-14 | Disposition: A | Discharge status: Home / Self Care | Payer: 59 | Source: Ambulatory Visit | Attending: Family | Admitting: Family

## 2020-01-14 DIAGNOSIS — Z853 Personal history of malignant neoplasm of breast: Secondary | ICD-10-CM

## 2020-01-14 DIAGNOSIS — Z9889 Other specified postprocedural states: Secondary | ICD-10-CM

## 2020-06-29 ENCOUNTER — Other Ambulatory Visit: Payer: Self-pay | Admitting: Hematology & Oncology

## 2020-06-29 DIAGNOSIS — C50411 Malignant neoplasm of upper-outer quadrant of right female breast: Secondary | ICD-10-CM

## 2020-06-30 ENCOUNTER — Telehealth: Payer: Self-pay | Admitting: *Deleted

## 2020-06-30 NOTE — Telephone Encounter (Signed)
Call received from patient requesting to cancel appts scheduled for this Friday d/t insurance issues.  Pt.'s states that once her insurance issues are worked out, her daughter will call office back to reschedule her appts.

## 2020-07-03 ENCOUNTER — Inpatient Hospital Stay: Payer: 59 | Admitting: Hematology & Oncology

## 2020-07-03 ENCOUNTER — Inpatient Hospital Stay: Payer: 59

## 2020-07-20 ENCOUNTER — Other Ambulatory Visit: Payer: Self-pay | Admitting: *Deleted

## 2020-07-20 ENCOUNTER — Encounter: Payer: Self-pay | Admitting: Adult Health

## 2020-07-20 DIAGNOSIS — G40909 Epilepsy, unspecified, not intractable, without status epilepticus: Secondary | ICD-10-CM

## 2020-07-20 MED ORDER — LEVETIRACETAM 500 MG PO TABS: 500.0000 mg | ORAL_TABLET | Freq: Two times a day (BID) | ORAL | 3 refills | Status: DC

## 2020-09-30 ENCOUNTER — Other Ambulatory Visit: Payer: Self-pay | Admitting: Hematology & Oncology

## 2020-09-30 DIAGNOSIS — C50411 Malignant neoplasm of upper-outer quadrant of right female breast: Secondary | ICD-10-CM

## 2020-11-12 ENCOUNTER — Encounter: Payer: Self-pay | Admitting: Gastroenterology

## 2020-12-22 ENCOUNTER — Telehealth: Payer: Medicaid Other | Admitting: Adult Health

## 2021-01-04 ENCOUNTER — Other Ambulatory Visit: Payer: Self-pay | Admitting: Family

## 2021-02-03 ENCOUNTER — Other Ambulatory Visit: Payer: Self-pay | Admitting: Nurse Practitioner

## 2021-02-03 DIAGNOSIS — Z1231 Encounter for screening mammogram for malignant neoplasm of breast: Secondary | ICD-10-CM

## 2021-02-09 ENCOUNTER — Other Ambulatory Visit: Payer: Self-pay | Admitting: Obstetrics and Gynecology

## 2021-02-09 DIAGNOSIS — Z1231 Encounter for screening mammogram for malignant neoplasm of breast: Secondary | ICD-10-CM

## 2021-03-16 ENCOUNTER — Ambulatory Visit: Payer: 59

## 2021-04-15 ENCOUNTER — Other Ambulatory Visit: Payer: Self-pay | Admitting: Obstetrics and Gynecology

## 2021-04-15 DIAGNOSIS — Z1231 Encounter for screening mammogram for malignant neoplasm of breast: Secondary | ICD-10-CM

## 2021-05-04 ENCOUNTER — Ambulatory Visit: Payer: Self-pay | Admitting: *Deleted

## 2021-05-04 ENCOUNTER — Ambulatory Visit
Admission: RE | Admit: 2021-05-04 | Discharge: 2021-05-04 | Disposition: A | Discharge status: Home / Self Care | Payer: No Typology Code available for payment source | Source: Ambulatory Visit | Attending: Obstetrics and Gynecology | Admitting: Obstetrics and Gynecology

## 2021-05-04 ENCOUNTER — Other Ambulatory Visit: Payer: Self-pay

## 2021-05-04 VITALS — BP 152/90 | Wt 167.2 lb

## 2021-05-04 DIAGNOSIS — Z1231 Encounter for screening mammogram for malignant neoplasm of breast: Secondary | ICD-10-CM

## 2021-05-04 DIAGNOSIS — Z1239 Encounter for other screening for malignant neoplasm of breast: Secondary | ICD-10-CM

## 2021-05-04 NOTE — Patient Instructions (Signed)
Explained breast self awareness with [name]. Patient did not need a Pap smear today due to patient has a history of a hysterectomy for benign reasons. Let patient know that she doesn't need any further Pap smears due to her history of a hysterectomy for benign reasons. Referred patient to the [facility] for a screening mammogram on mobile unit. Appointment scheduled Tuesday, May 04, 2021 at 1100. Patient escorted to the mobile unit following [facility] appointment for her screening mammogram. Let patient know the [facility] will follow up with her within the next couple weeks with results of her mammogram by letter or phone. Discussed smoking cessation with patient. Referred to the [facility] Quitline and gave resources to the free smoking cessation classes at [facility]. Eileen Holland verbalized understanding.  Tiffaney Heimann, [place], RN 10:35 AM

## 2021-05-04 NOTE — Progress Notes (Signed)
Eileen Holland is a 60 y.o. female who presents to [facility] clinic today with no complaints.    Pap Smear: Pap smear not completed today. Last Pap smear was 10/06/2008 at Dr. Caren [facility]ins [facility] and was normal. Per patient has no history of an abnormal Pap smear. Patient has a history of a hysterectomy 03/29/2011 due to fibroids. Patient doesn't need any further Pap smears due to her history of a hysterectomy for benign reasons per ASCCP and BCCCP guidelines. Last Pap smear result is available in Epic.   Physical exam: Breasts Left breast larger than right breast due to history of right breast lumpectomy in 2016 for breast cancer. No skin abnormalities left breast. Radiation changes observed skin right breast. No nipple retraction bilateral breasts. No nipple discharge bilateral breasts. No lymphadenopathy. No lumps palpated bilateral breasts. Palpated a thickened area below right axilla that per patient has been there for years. No complaints of pain or tenderness on exam.      MM DIAG BREAST TOMO BILATERAL  Result Date: 01/14/2020 CLINICAL DATA:  60 year old female for annual follow-up. History of RIGHT breast cancer and lumpectomy in 2016. EXAM: DIGITAL DIAGNOSTIC BILATERAL MAMMOGRAM WITH CAD AND TOMO COMPARISON:  Previous exam(s). ACR Breast Density Category b: There are scattered areas of fibroglandular density. FINDINGS: 2D and 3D full field views of both breasts and a magnification view of the lumpectomy site demonstrate no suspicious mass, nonsurgical distortion or worrisome calcifications. RIGHT lumpectomy changes again noted. Mammographic images were processed with CAD. IMPRESSION: No evidence of breast malignancy. RECOMMENDATION: Bilateral screening mammogram in 1 year. I have discussed the findings and recommendations with the patient. If applicable, a reminder letter will be sent to the patient regarding the next appointment. BI-RADS CATEGORY  2: Benign. Electronically Signed   By: Margarette Canada  M.D.   On: 01/14/2020 15:18   MM DIAG BREAST TOMO BILATERAL  Result Date: 06/27/2018 CLINICAL DATA:  60 year old female status post malignant right lumpectomy with radiation therapy in 2016. No current symptoms. EXAM: DIGITAL DIAGNOSTIC BILATERAL MAMMOGRAM WITH CAD AND TOMO COMPARISON:  Previous exam(s). ACR Breast Density Category c: The breast tissue is heterogeneously dense, which may obscure small masses. FINDINGS: Stable postsurgical and post treatment changes are noted on the right. No new or suspicious findings are identified in either breast. The parenchymal pattern is stable. Mammographic images were processed with CAD. IMPRESSION: 1. No mammographic evidence of malignancy. 2. Stable right breast posttreatment changes. RECOMMENDATION: Diagnostic mammogram is suggested in 1 year. (Code:DM-B-01Y) I have discussed the findings and recommendations with the patient. Results were also provided in writing at the conclusion of the visit. If applicable, a reminder letter will be sent to the patient regarding the next appointment. BI-RADS CATEGORY  2: Benign. Electronically Signed   By: Kristopher Oppenheim M.D.   On: 06/27/2018 13:34   MM DIAG BREAST TOMO BILATERAL  Result Date: 05/30/2017 CLINICAL DATA:  60 year old female status post right lumpectomy in 2016. EXAM: 2D DIGITAL DIAGNOSTIC BILATERAL MAMMOGRAM WITH CAD AND ADJUNCT TOMO COMPARISON:  Previous exam(s). ACR Breast Density Category c: The breast tissue is heterogeneously dense, which may obscure small masses. FINDINGS: Stable postoperative changes are noted in the upper outer right breast at far posterior depth. No suspicious masses or calcifications are identified in either breast. Mammographic images were processed with CAD. IMPRESSION: 1. No mammographic evidence of malignancy in either breast. 2. Stable right breast post lumpectomy changes. RECOMMENDATION: Diagnostic mammogram is suggested in 1 year. (Code:DM-B-01Y) I have discussed  the findings  and recommendations with the patient. Results were also provided in writing at the conclusion of the visit. If applicable, a reminder letter will be sent to the patient regarding the next appointment. BI-RADS CATEGORY  2: Benign. Electronically Signed   By: Kristopher Oppenheim M.D.   On: 05/30/2017 16:23     Pelvic/Bimanual Pap is not indicated today per BCCCP guidelines.   Smoking History: Patient is a current smoker. Discussed smoking cessation with patient. Referred to the [facility] Quitline and gave resources to the free smoking cessation classes at [facility].   Patient Navigation: Patient education provided. Access to services provided for patient through [facility] program.   Colorectal Cancer Screening: Patient has had colonoscopy completed on 07/18/2017.  Patient refused FIT Test. No complaints today.    Breast and Cervical Cancer Risk Assessment: Patient has family history of her sister having breast cancer. Patient has a personal history of breast cancer. Patient has no known genetic mutations or history of radiation treatment to the chest before age 49. Patient does not have history of cervical dysplasia, immunocompromised, or DES exposure in-utero.  Risk Assessment     Risk Scores       05/04/2021   Last edited by: Demetrius Revel, LPN   5-year risk:    Lifetime risk:             A: BCCCP exam without pap smear No complaints.  P: Referred patient to the [place] for a screening mammogram on mobile unit. Appointment scheduled Tuesday, May 04, 2021 at 1100.  Loletta Parish, RN 05/04/2021 10:35 AM

## 2021-07-01 ENCOUNTER — Other Ambulatory Visit: Payer: Self-pay | Admitting: Nurse Practitioner

## 2021-07-01 DIAGNOSIS — Z720 Tobacco use: Secondary | ICD-10-CM

## 2021-07-01 DIAGNOSIS — Z122 Encounter for screening for malignant neoplasm of respiratory organs: Secondary | ICD-10-CM

## 2021-07-20 ENCOUNTER — Other Ambulatory Visit: Payer: Self-pay | Admitting: Adult Health

## 2021-07-20 ENCOUNTER — Telehealth: Payer: Self-pay | Admitting: Adult Health

## 2021-07-20 DIAGNOSIS — G40909 Epilepsy, unspecified, not intractable, without status epilepticus: Secondary | ICD-10-CM

## 2021-07-21 NOTE — Telephone Encounter (Signed)
noted 

## 2021-07-21 NOTE — Telephone Encounter (Signed)
Pt was called to inform of medication being called in but to also inform pt she would need to speak with billing before we would be able to schedule her needed f/u.  A vm was left with office# to call.

## 2021-08-04 ENCOUNTER — Ambulatory Visit: Payer: No Typology Code available for payment source | Admitting: Hematology & Oncology

## 2021-08-04 ENCOUNTER — Other Ambulatory Visit: Payer: No Typology Code available for payment source

## 2021-08-20 ENCOUNTER — Other Ambulatory Visit: Payer: Self-pay | Admitting: Adult Health

## 2021-08-20 DIAGNOSIS — G40909 Epilepsy, unspecified, not intractable, without status epilepticus: Secondary | ICD-10-CM

## 2021-10-03 ENCOUNTER — Encounter (HOSPITAL_BASED_OUTPATIENT_CLINIC_OR_DEPARTMENT_OTHER): Payer: Self-pay | Admitting: Emergency Medicine

## 2021-10-03 ENCOUNTER — Other Ambulatory Visit: Payer: Self-pay

## 2021-10-03 ENCOUNTER — Emergency Department (HOSPITAL_BASED_OUTPATIENT_CLINIC_OR_DEPARTMENT_OTHER)
Admission: EM | Admit: 2021-10-03 | Discharge: 2021-10-03 | Disposition: A | Discharge status: Home / Self Care | Payer: Self-pay | Attending: Emergency Medicine | Admitting: Emergency Medicine

## 2021-10-03 ENCOUNTER — Emergency Department (HOSPITAL_BASED_OUTPATIENT_CLINIC_OR_DEPARTMENT_OTHER): Payer: Self-pay

## 2021-10-03 DIAGNOSIS — K029 Dental caries, unspecified: Secondary | ICD-10-CM

## 2021-10-03 DIAGNOSIS — K047 Periapical abscess without sinus: Secondary | ICD-10-CM

## 2021-10-03 DIAGNOSIS — R7309 Other abnormal glucose: Secondary | ICD-10-CM | POA: Insufficient documentation

## 2021-10-03 DIAGNOSIS — E876 Hypokalemia: Secondary | ICD-10-CM | POA: Insufficient documentation

## 2021-10-03 DIAGNOSIS — Z853 Personal history of malignant neoplasm of breast: Secondary | ICD-10-CM | POA: Insufficient documentation

## 2021-10-03 DIAGNOSIS — I10 Essential (primary) hypertension: Secondary | ICD-10-CM | POA: Insufficient documentation

## 2021-10-03 DIAGNOSIS — F17200 Nicotine dependence, unspecified, uncomplicated: Secondary | ICD-10-CM | POA: Insufficient documentation

## 2021-10-03 DIAGNOSIS — Z79899 Other long term (current) drug therapy: Secondary | ICD-10-CM | POA: Insufficient documentation

## 2021-10-03 DIAGNOSIS — M5442 Lumbago with sciatica, left side: Secondary | ICD-10-CM

## 2021-10-03 LAB — CBC WITH DIFFERENTIAL/PLATELET
Abs Immature Granulocytes: 0.06 10*3/uL (ref 0.00–0.07)
Basophils Absolute: 0 10*3/uL (ref 0.0–0.1)
Basophils Relative: 0 %
Eosinophils Absolute: 0 10*3/uL (ref 0.0–0.5)
Eosinophils Relative: 0 %
HCT: 40.2 % (ref 36.0–46.0)
Hemoglobin: 14 g/dL (ref 12.0–15.0)
Immature Granulocytes: 0 %
Lymphocytes Relative: 8 %
Lymphs Abs: 1.4 10*3/uL (ref 0.7–4.0)
MCH: 35 pg — ABNORMAL HIGH (ref 26.0–34.0)
MCHC: 34.8 g/dL (ref 30.0–36.0)
MCV: 100.5 fL — ABNORMAL HIGH (ref 80.0–100.0)
Monocytes Absolute: 1.1 10*3/uL — ABNORMAL HIGH (ref 0.1–1.0)
Monocytes Relative: 7 %
Neutro Abs: 14 10*3/uL — ABNORMAL HIGH (ref 1.7–7.7)
Neutrophils Relative %: 85 %
Platelets: 234 10*3/uL (ref 150–400)
RBC: 4 MIL/uL (ref 3.87–5.11)
RDW: 11.6 % (ref 11.5–15.5)
WBC: 16.6 10*3/uL — ABNORMAL HIGH (ref 4.0–10.5)
nRBC: 0 % (ref 0.0–0.2)

## 2021-10-03 LAB — BASIC METABOLIC PANEL
Anion gap: 8 (ref 5–15)
BUN: 10 mg/dL (ref 6–20)
CO2: 25 mmol/L (ref 22–32)
Calcium: 9.4 mg/dL (ref 8.9–10.3)
Chloride: 102 mmol/L (ref 98–111)
Creatinine, Ser: 0.85 mg/dL (ref 0.44–1.00)
GFR, Estimated: >60 mL/min (ref >60)
Glucose, Bld: 139 mg/dL — ABNORMAL HIGH (ref 70–99)
Potassium: 3.4 mmol/L — ABNORMAL LOW (ref 3.5–5.1)
Sodium: 135 mmol/L (ref 135–145)

## 2021-10-03 MED ORDER — ACETAMINOPHEN 500 MG PO TABS
1000.0000 mg | ORAL_TABLET | Freq: Once | ORAL | Status: AC
  Administered 2021-10-03: 1000 mg via ORAL
  Filled 2021-10-03: qty 2

## 2021-10-03 MED ORDER — CLINDAMYCIN HCL 150 MG PO CAPS: 150.0000 mg | ORAL_CAPSULE | Freq: Four times a day (QID) | ORAL | 0 refills | Status: DC

## 2021-10-03 MED ORDER — HYDROCODONE-ACETAMINOPHEN 5-325 MG PO TABS: 1.0000 | ORAL_TABLET | Freq: Four times a day (QID) | ORAL | 0 refills | Status: DC | PRN

## 2021-10-03 MED ORDER — PREDNISONE 50 MG PO TABS: 50.0000 mg | ORAL_TABLET | Freq: Every day | ORAL | 0 refills | Status: DC

## 2021-10-03 NOTE — ED Provider Notes (Signed)
[facility] Provider Note   CSN: [id number] Arrival date & time: 10/03/21  5053     History  Chief Complaint  Patient presents with   Dental Pain   Leg Pain    Eileen Holland is a 61 y.o. female with past medical history significant for breast cancer, hypertension, hyperlipidemia, tobacco use, known dental caries, history of sciatica who presents with 2 concerns today.  Patient reports that she has had left-sided dental pain and swelling, reports that the toothache has been going on for weeks, but swelling to the face started just yesterday.  Patient reports that she is having some difficulty opening her mouth but no difficulty swallowing.  She denies any fever or chills.  Patient reports that she is having some pain in the left lower back with bending, lifting.  Patient reports she is allergic to ibuprofen, taking Tylenol for pain with minimal relief.  Patient denies any numbness, tingling, history of IV drug use, history of chronic corticosteroid use.  Back pain does not awaken patient from sleep.   Dental Pain Leg Pain Associated symptoms: back pain       Home Medications Prior to Admission medications   Medication Sig Start Date End Date Taking? Authorizing Provider  clindamycin (CLEOCIN) 150 MG capsule Take 1 capsule (150 mg total) by mouth every 6 (six) hours. 10/03/21  Yes Trashaun Streight H, PA-C  HYDROcodone-acetaminophen (NORCO/VICODIN) 5-325 MG tablet Take 1-2 tablets by mouth every 6 (six) hours as needed. 10/03/21  Yes Naziah Weckerly H, PA-C  predniSONE (DELTASONE) 50 MG tablet Take 1 tablet (50 mg total) by mouth daily. 10/03/21  Yes Juleah Paradise H, PA-C  ACETAMINOPHEN-BUTALBITAL 50-325 MG TABS Take 1 tablet by mouth every 4 (four) hours as needed (headache.).    [provider]  ALPRAZolam Duanne Moron) 0.5 MG tablet Take by mouth. 11/03/17   [provider]  amLODipine (NORVASC) 10 MG tablet Take 10 mg by mouth every  evening. @@7 :30 pm    [provider]  B Complex Vitamins (B COMPLEX PO) Take 1 tablet by mouth daily at 12 noon. (B complex-C-E-zinc)    [provider]  Cholecalciferol (VITAMIN D HIGH POTENCY) 1000 units capsule Take 1,000 Units daily by mouth.     [provider]  docusate sodium (COLACE) 100 MG capsule Take 100 mg by mouth 2 (two) times daily.    [provider]  hydrochlorothiazide 25 MG tablet Take 25 mg by mouth every morning.     [provider]  ketoconazole (NIZORAL) 2 % cream Apply 1 application topically daily as needed for irritation.    [provider]  letrozole [facility]) 2.5 MG tablet TAKE ONE TABLET BY MOUTH EVERY DAY 09/30/20   Volanda Napoleon, MD  levETIRAcetam (KEPPRA) 500 MG tablet Take 1 tablet (500 mg total) by mouth 2 (two) times daily. MUST BE SEEN FOR FURTHER REFILLS. CALL [phone]. 07/20/21   Ward Givens, NP  levocetirizine (XYZAL) 5 MG tablet Take 5 mg by mouth daily at 12 noon. For allergies     [provider]  losartan (COZAAR) 100 MG tablet Take 100 mg by mouth every morning.    [provider]  metoprolol succinate (TOPROL-XL) 100 MG 24 hr tablet Take 100 mg by mouth every morning. Take with or immediately following a meal.    [provider]  PARoxetine (PAXIL) 20 MG tablet Take 20 mg by mouth every morning.      [provider]  pravastatin (PRAVACHOL) 20 MG tablet Take 20 mg by mouth daily.  03/26/17   [provider]  pravastatin (PRAVACHOL) 40 MG tablet Take by mouth. 04/10/19   [provider]  Prenatal Vit-Fe Fumarate-FA (PRENATAL PO) Take 1 tablet by mouth daily at 12 noon.    [provider]      Allergies    Amoxicillin, Aspirin, and Ibuprofen    Review of Systems   Review of Systems  HENT:  Positive for dental problem.   Musculoskeletal:  Positive for back pain.  All other systems reviewed and are negative.  Physical  Exam Updated Vital Signs BP (!) 154/99    Pulse 91    Temp 99.1 F (37.3 C) (Oral)    Resp 16    LMP 03/14/2011    SpO2 97%  Physical Exam Vitals and nursing note reviewed.  Constitutional:      General: She is not in acute distress.    Appearance: Normal appearance.  HENT:     Head: Normocephalic and atraumatic.     Mouth/Throat:     Comments: Patient with dental caries, poking teeth on the left.  She has significant buccal mucosal swelling.  She has some trismus without drooling, intact swallow, intact speech. Eyes:     General:        Right eye: No discharge.        Left eye: No discharge.  Neck:     Comments: Patient with significant tenderness palpation left neck, with some anterior cervical adenopathy.  Soft tissue swelling noted. Cardiovascular:     Rate and Rhythm: Normal rate and regular rhythm.     Heart sounds: No murmur heard.   No friction rub. No gallop.  Pulmonary:     Effort: Pulmonary effort is normal.     Breath sounds: Normal breath sounds.  Abdominal:     General: Bowel sounds are normal.     Palpations: Abdomen is soft.  Musculoskeletal:     Comments: Intact strength 5 out of 5 bilateral lower extremities.  No midline spinal tenderness, step-off or deformity.  Patient with very minimal paraspinous muscle tenderness on the left lumbar spine.  She has positive straight leg raise on the left.  Skin:    General: Skin is warm and dry.     Capillary Refill: Capillary refill takes less than 2 seconds.  Neurological:     Mental Status: She is alert and oriented to person, place, and time.  Psychiatric:        Mood and Affect: Mood normal.        Behavior: Behavior normal.    ED Results / Procedures / Treatments   Labs (all labs ordered are listed, but only abnormal results are displayed) Labs Reviewed  CBC WITH DIFFERENTIAL/PLATELET - Abnormal; Notable for the following components:      Result Value   WBC 16.6 (*)    MCV 100.5 (*)    MCH 35.0 (*)     Neutro Abs 14.0 (*)    Monocytes Absolute 1.1 (*)    All other components within normal limits  BASIC METABOLIC PANEL - Abnormal; Notable for the following components:   Potassium 3.4 (*)    Glucose, Bld 139 (*)    All other components within normal limits    EKG None  Radiology CT Maxillofacial Wo Contrast  Result Date: 10/03/2021 CLINICAL DATA:  Maxillary/facial abscess lower head and top of neck, eval of soft tissue swelling in dental infection EXAM: CT  MAXILLOFACIAL WITHOUT CONTRAST TECHNIQUE: Multidetector CT imaging of the maxillofacial structures was performed. Multiplanar CT image reconstructions were also generated. RADIATION DOSE REDUCTION: This exam was performed according to the departmental dose-optimization program which includes automated exposure control, adjustment of the mA and/or kV according to patient size and/or use of iterative reconstruction technique. COMPARISON:  None. FINDINGS: Osseous: No fracture or mandibular dislocation. No destructive process. Dental findings described below. Orbits: Negative. No traumatic or inflammatory finding. Sinuses: Mild mucosal thickening frothy secretions in the lateral left sphenoid sinus. Mild mucosal thickening in the inferior right maxillary sinus (likely odontogenic). Otherwise, sinuses are largely clear. Hypoplastic left maxillary sinus. Soft tissues: Marked swelling and soft tissue thickening along the left mandible which involves muscles of mastication in this region out compatible with cellulitis/phlegmon and myositis. Edema extends inferiorly into the visualized upper left neck along a thickened platysma. Findings are likely odontogenic in etiology given large dental caries and periapical lucency of the adjacent left mandibular first molar. Streak artifact limits evaluation without visible subperiosteal abscess. Additional periapical lucencies of a right maxillary molar overlying (with overlying probably odontogenic mild right  maxillary sinus mucosal thickening) and right maxillary second premolar. Limited intracranial: No obvious acute abnormality in the visualized brain on limited visualization/assessment. IMPRESSION: Findings compatible with cellulitis/phlegmon along the left mandible and myositis of muscles of mastication in this region. Findings are likely odontogenic in etiology given large dental caries and periapical lucency of the adjacent left mandibular first molar. No visible subperiosteal abscess. Electronically Signed   By: Margaretha Sheffield M.D.   On: 10/03/2021 10:38    Procedures Procedures    Medications Ordered in ED Medications  acetaminophen (TYLENOL) tablet 1,000 mg (1,000 mg Oral Given 10/03/21 1004)    ED Course/ Medical Decision Making/ A&P                           Medical Decision Making Amount and/or Complexity of Data Reviewed Labs: ordered. Radiology: ordered.  Risk OTC drugs.   Patient with back pain. No neurological deficits. Patient is ambulatory. No warning symptoms of back pain including: fecal incontinence, urinary retention or overflow incontinence, night sweats, waking from sleep with back pain, unexplained fevers or weight loss, IVDU, recent trauma. No concern for cauda equina, epidural abscess, or other serious cause of back pain. Patient does have a history of breast cancer, but she is 5 years status post no evidence of disease secondary to excision, radiation.  She has not had any fever, night sweats, or other worrisome symptoms associate with her back pain.  She does have a history of sciatic nerve pain, she has positive straight leg raise on the left.  She is neurologically intact.  Due to the severity of patient's dental pain, and swelling we will obtain CT maxillofacial, with request for visualization into neck.  Personally interpreted CT maxillofacial which shows significant swelling compatible with phlegmon, dental infection.  No evidence of abscess noted.  Patent  airway.  No physical exam evidence of acute Ludwig angina noted.  Personally interpreted lab work which is significant for very mild hypokalemia, potassium 3.4.  Encouraged oral intake.  Glucose mildly elevated 139.  Patient does have a fairly significant leukocytosis with white blood cells 16.6, with neutrophil predominance.  Looking at patient's clinical gestalt we will prescribe clindamycin antibiotics, as well as short prednisone burst to help with sciatic back pain as well as swelling and inflammation from dental infection.  Encouraged Tylenol for  pain control and will provide short course of Norco.  Courage close follow-up with dentistry at patient's earliest convenience.  Encouraged follow-up with orthopedics if ongoing back pain despite treatment.  Patient discharged in stable condition at this time, extensive return precautions given.  Final Clinical Impression(s) / ED Diagnoses Final diagnoses:  Dental infection  Pain due to dental caries  Acute left-sided low back pain with left-sided sciatica    Rx / DC Orders ED Discharge Orders          Ordered    predniSONE (DELTASONE) 50 MG tablet  Daily        10/03/21 1053    clindamycin (CLEOCIN) 150 MG capsule  Every 6 hours        10/03/21 1053    HYDROcodone-acetaminophen (NORCO/VICODIN) 5-325 MG tablet  Every 6 hours PRN        10/03/21 1115              Merlina Marchena, Joesph Fillers, PA-C 10/03/21 1118    Davonna Belling, MD 10/03/21 1530

## 2021-10-03 NOTE — Discharge Instructions (Addendum)
Please use Tylenol for pain.  You may use 1000 mg of Tylenol every 6 hours. Not to exceed 4 g of Tylenol within 24 hours.   Please follow-up with a dentist as soon as you can.  Back pain persists despite all of the above I recommend that you follow-up with orthopedics at your earliest convenience.

## 2021-10-03 NOTE — ED Triage Notes (Signed)
Pt reports L side dental pain and swelling.  Also reports sciatic nerve pain is acting up on L leg. Has been doing more bending and lifting than usual which may have exacerbated pain. Pt ambulatory to room with steady gait

## 2021-12-28 ENCOUNTER — Other Ambulatory Visit: Payer: Self-pay | Admitting: Nurse Practitioner

## 2021-12-28 DIAGNOSIS — Z720 Tobacco use: Secondary | ICD-10-CM

## 2021-12-28 DIAGNOSIS — Z122 Encounter for screening for malignant neoplasm of respiratory organs: Secondary | ICD-10-CM

## 2022-01-24 ENCOUNTER — Ambulatory Visit
Admission: RE | Admit: 2022-01-24 | Discharge: 2022-01-24 | Disposition: A | Discharge status: Home / Self Care | Payer: No Typology Code available for payment source | Source: Ambulatory Visit | Attending: Nurse Practitioner | Admitting: Nurse Practitioner

## 2022-01-24 DIAGNOSIS — Z122 Encounter for screening for malignant neoplasm of respiratory organs: Secondary | ICD-10-CM

## 2022-01-24 DIAGNOSIS — Z720 Tobacco use: Secondary | ICD-10-CM

## 2022-04-20 ENCOUNTER — Other Ambulatory Visit: Payer: Self-pay | Admitting: Adult Health

## 2022-04-20 DIAGNOSIS — G40909 Epilepsy, unspecified, not intractable, without status epilepticus: Secondary | ICD-10-CM

## 2022-04-21 ENCOUNTER — Other Ambulatory Visit: Payer: Self-pay | Admitting: Adult Health

## 2022-04-21 DIAGNOSIS — G40909 Epilepsy, unspecified, not intractable, without status epilepticus: Secondary | ICD-10-CM

## 2022-04-21 NOTE — Telephone Encounter (Signed)
I called pt as we received request for her levetiracetam , we have not seen her since 2021, last prescription was 07/2021 for one month.  I relayed to pt she will need appt to get more refills, but since she has been taking medication, she must be getting it somewhere else.  She mentioned a Dr. Jimmye Norman she changed practices, but was a little confused.  I told her I would call the [facility] and see what I could find out since she has been out of medication for a day.  She is not sure if she had seizure in her sleep/  I spoke to Quillian Quince, at [place], she has gotten from Dr. Jimmye Norman at [facility]. He relayed that they filled it for her.  I called pt back and relayed spoke to [facility] and a Dr. Jimmye Norman has filled for her (pt said they did refill till October).  She will call back to make appt here.  She was appreciative of callback.

## 2022-05-05 NOTE — Progress Notes (Addendum)
Chief Complaint  Patient presents with   Follow-up    RM 2, alone. OSA/seizure f/u.  Just had seizure about 2 weeks ago. Missed a dose of medication d/t needing refill/needing appt at our office.  CPAP going well, no issues.  She has no income and unable to get new supplies as often as she wants. She cleans equipment well.      HISTORY OF PRESENT ILLNESS:  05/10/22 ALL:  Eileen Holland is a 61 y.o. female here today for follow up for seizures and OSA on BiPAP. She was last seen by Eileen Blossom, NP 12/2019 and doing well on levetiracetam 574m BID. She reports doing well over the past two years until 2 weeks ago. She ran out of levetiracetam for 24 hours and reports having a breakthrough seizure. She states that she woke up with blood on her pillow and her tongue was sore. She had brain fog that resolved shortly after she woke up. All previous seizures have been nocturnal. PCP was filling med for her [place] She is able to drive but does not have a car. She uses public transportation.   She continues BiPAP every night. She is doing well on therapy. She does note a significant leak but reports it is due to not being able to change out mask regularly. She is uninsured. She is unable to afford replacement supplies regularly. Her daughter is helping her and she plans to replace mask this week. She denies concerns with machine.     12/25/2019 MM (mychart): Ms. DDelucchiis a 61year old female with a history of obstructive sleep apnea on CPAP.  Her download indicates that she use her machine nightly for compliance of 100%.  She has her machine greater than 4 hours each night.  On average she uses her machine 7 hours and 7 minutes.  Her residual AHI is 0.5 on 21/16 centimeters of water with pressure support of 4 cm of water.  Her leak in the 95th percentile is 90.7 L/min.  Her leak has remained high despite several mask refitting's.  She is currently happy with her current mask.    Seizures: Denies any seizure events.  Continues on Keppra 500 mg twice a day   07/29/19 ALL (mychart): JTera Pellicaneis a 61y.o. female here today for follow up  for seizure. She continues levetiracetam 50108mtwice daily.  She is tolerating medication well with no obvious adverse effects.  She denies any further seizure activity.  She is followed by Dr AtRexene Albertsor OSA on BiPAP. She was last seen in 04/2019 with follow up recommended in 1 year.  Compliance reports reviewed today for dates 06/29/2019 through 07/28/2019 reveals excellent compliance.  She is using BiPAP therapy every day.  29 of the last 30 days she used BiPAP therapy greater than 4 hours.  On average 7 hours and 5 minutes.  Residual AHI was 0.2 with IPAP of 21 cm of water and EPAP of 16 cm of water.  Pressure support of 4 cm of water.  She continues to have an elevated leak in the 95th percentile of 95.2.  She has had multiple mask refitting's but feels that current mask is the best option for her.  She feels that leak is related to restless sleeping.  She will continue to be mindful of a leak and ensure that mask and headgear are well fitting.   REVIEW OF SYSTEMS: Out of a complete 14 system review of  symptoms, the patient complains only of the following symptoms, none and all other reviewed systems are negative.  ESS 6/24  ALLERGIES: Allergies  Allergen Reactions   Amoxicillin Other (See Comments)    Unknown reaction   Aspirin Hives   Ibuprofen Hives    HOME MEDICATIONS: Outpatient Medications Prior to Visit  Medication Sig Dispense Refill   ACETAMINOPHEN-BUTALBITAL 50-325 MG TABS Take 1 tablet by mouth every 4 (four) hours as needed (headache.).     ALPRAZolam (XANAX) 0.5 MG tablet Take by mouth.     amLODipine (NORVASC) 10 MG tablet Take 10 mg by mouth every evening. @'@7' :30 pm     B Complex Vitamins (B COMPLEX PO) Take 1 tablet by mouth daily at 12 noon. (B complex-C-E-zinc)     Cholecalciferol (VITAMIN D HIGH POTENCY) 1000  units capsule Take 1,000 Units daily by mouth.      docusate sodium (COLACE) 100 MG capsule Take 100 mg by mouth 2 (two) times daily.     hydrochlorothiazide 25 MG tablet Take 25 mg by mouth every morning.      HYDROcodone-acetaminophen (NORCO/VICODIN) 5-325 MG tablet Take 1-2 tablets by mouth every 6 (six) hours as needed. 12 tablet 0   ketoconazole (NIZORAL) 2 % cream Apply 1 application topically daily as needed for irritation.     letrozole (FEMARA) 2.5 MG tablet TAKE ONE TABLET BY MOUTH EVERY DAY 90 tablet 1   levocetirizine (XYZAL) 5 MG tablet Take 5 mg by mouth daily at 12 noon. For allergies      losartan (COZAAR) 100 MG tablet Take 100 mg by mouth every morning.     metoprolol succinate (TOPROL-XL) 100 MG 24 hr tablet Take 100 mg by mouth every morning. Take with or immediately following a meal.     PARoxetine (PAXIL) 20 MG tablet Take 20 mg by mouth every morning.       pravastatin (PRAVACHOL) 20 MG tablet Take 20 mg by mouth daily.      pravastatin (PRAVACHOL) 40 MG tablet Take by mouth.     predniSONE (DELTASONE) 50 MG tablet Take 1 tablet (50 mg total) by mouth daily. 5 tablet 0   Prenatal Vit-Fe Fumarate-FA (PRENATAL PO) Take 1 tablet by mouth daily at 12 noon.     levETIRAcetam (KEPPRA) 500 MG tablet Take 1 tablet (500 mg total) by mouth 2 (two) times daily. MUST BE SEEN FOR FURTHER REFILLS. CALL [phone]. 60 tablet 0   clindamycin (CLEOCIN) 150 MG capsule Take 1 capsule (150 mg total) by mouth every 6 (six) hours. 28 capsule 0   No facility-administered medications prior to visit.     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  Advised if needs it more regularly, to follow-up and discuss management furtehr    High cholesterol    Hyperlipidemia    Hypertension    Personal history of radiation therapy 2016   Radiation 07/13/15-08/03/15  right breast 4256 cGy   Seizures ([place]) 10/26/2017     PAST SURGICAL HISTORY: Past Surgical History:  Procedure Laterality Date   BREAST BIOPSY Right 04/28/2015   BREAST LUMPECTOMY Right 06/09/2015   BREAST LUMPECTOMY WITH RADIOACTIVE SEED LOCALIZATION Right 06/09/2015   Procedure: RIGHT BREAST LUMPECTOMY WITH RADIOACTIVE SEED LOCALIZATION;  Surgeon: Erroll Luna, MD;  Location: [facility];  Service: General;  Laterality: Right;   COLON SURGERY  07/17/2014   COLONOSCOPY     KNEE ARTHROSCOPY Right 2000   POLYPECTOMY  07/17/2014   polyp removed surgicallty    VAGINAL HYSTERECTOMY  2012     FAMILY HISTORY: Family History  Problem Relation Age of Onset   Diabetes Mother    Hypertension Father    Lung cancer Father 43       former smoker   Colon polyps Father        unspecified number   Breast cancer Sister 12       negative BRCA testing in 2007   Stomach cancer Maternal Uncle        dx. 50s-early 60s   Heart attack Paternal Uncle    Colon cancer Neg Hx    Esophageal cancer Neg Hx    Rectal cancer Neg Hx      SOCIAL HISTORY: Social History   Socioeconomic History   Marital status: Single    Spouse name: Not on file   Number of children: 1   Years of education: Not on file   Highest education level: High school graduate  Occupational History   Not on file  Tobacco Use   Smoking status: Every Day    Packs/day: 0.50    Years: 32.00    Total pack years: 16.00    Types: Cigarettes   Smokeless tobacco: Never  Vaping Use   Vaping Use: Never used  Substance and Sexual Activity   Alcohol use: Yes    Alcohol/week: 0.0  standard drinks of alcohol    Comment: occas - maybe 1 drink every 3 mos   Drug use: No   Sexual activity: Not Currently    Birth control/protection: Surgical  Other Topics Concern   Not on file  Social History Narrative   Not on file   Social Determinants of Health   Financial Resource Strain: Not on file  Food Insecurity: No Food Insecurity (05/04/2021)   Hunger Vital Sign    Worried About Running Out of Food in the Last Year: Never true    Ran Out of Food in the Last Year: Never true  Transportation Needs: No Transportation Needs (05/04/2021)   PRAPARE - Hydrologist (Medical): No    Lack of Transportation (Non-Medical): No  Physical Activity: Not on file  Stress: Not on file  Social Connections: Not on file  Intimate Partner Violence: Not on file     PHYSICAL EXAM  Vitals:   05/10/22 0908  BP: (!) 135/90  Pulse: 72  Weight: 166 lb 12.8 oz (75.7 kg)  Height: 5' 2.5" (1.588 m)   Body mass index is 30.02 kg/m.  Generalized: Well developed, in no acute distress  Cardiology: normal rate and rhythm, no murmur auscultated  Respiratory: clear to auscultation bilaterally    Neurological examination  Mentation: Alert oriented to time, place, history taking. Follows all commands speech and language fluent Cranial nerve II-XII: Pupils were equal round reactive to light. Extraocular movements were full, visual field were full on confrontational test. Facial sensation  and strength were normal. Uvula tongue midline. Head turning and shoulder shrug  were normal and symmetric. Motor: The motor testing reveals 5 over 5 strength of all 4 extremities. Good symmetric motor tone is noted throughout.  Sensory: Sensory testing is intact to soft touch on all 4 extremities. No evidence of extinction is noted.  Coordination: Cerebellar testing reveals good finger-nose-finger and heel-to-shin bilaterally.  Gait and station: Gait is normal. Tandem gait is normal.  Romberg is negative. No drift is seen.  Reflexes: Deep tendon reflexes are symmetric and normal bilaterally.    DIAGNOSTIC DATA (LABS, IMAGING, TESTING) - I reviewed patient records, labs, notes, testing and imaging myself where available.  Lab Results  Component Value Date   WBC 16.6 (H) 10/03/2021   HGB 14.0 10/03/2021   HCT 40.2 10/03/2021   MCV 100.5 (H) 10/03/2021   PLT 234 10/03/2021      Component Value Date/Time   NA 135 10/03/2021 1029   NA 139 03/28/2017 1034   NA 138 09/27/2016 0820   K 3.4 (L) 10/03/2021 1029   K 4.0 03/28/2017 1034   K 3.5 09/27/2016 0820   CL 102 10/03/2021 1029   CL 103 03/28/2017 1034   CO2 25 10/03/2021 1029   CO2 30 03/28/2017 1034   CO2 27 09/27/2016 0820   GLUCOSE 139 (H) 10/03/2021 1029   GLUCOSE 110 03/28/2017 1034   BUN 10 10/03/2021 1029   BUN 11 03/28/2017 1034   BUN 8.6 09/27/2016 0820   CREATININE 0.85 10/03/2021 1029   CREATININE 0.96 07/04/2019 1045   CREATININE 1.1 03/28/2017 1034   CREATININE 0.9 09/27/2016 0820   CALCIUM 9.4 10/03/2021 1029   CALCIUM 9.8 03/28/2017 1034   CALCIUM 9.8 09/27/2016 0820   PROT 7.2 07/04/2019 1045   PROT 8.0 03/28/2017 1034   PROT 7.6 09/27/2016 0820   ALBUMIN 4.5 07/04/2019 1045   ALBUMIN 3.8 03/28/2017 1034   ALBUMIN 3.8 09/27/2016 0820   AST 19 07/04/2019 1045   AST 20 09/27/2016 0820   ALT 15 07/04/2019 1045   ALT 21 03/28/2017 1034   ALT 16 09/27/2016 0820   ALKPHOS 72 07/04/2019 1045   ALKPHOS 81 03/28/2017 1034   ALKPHOS 80 09/27/2016 0820   BILITOT 0.5 07/04/2019 1045   BILITOT 0.43 09/27/2016 0820   GFRNONAA >60 10/03/2021 1029   GFRNONAA >60 07/04/2019 1045   GFRAA >60 07/04/2019 1045   Lab Results  Component Value Date   CHOL 173 10/27/2017   HDL 61 10/27/2017   LDLCALC 86 10/27/2017   TRIG 132 10/27/2017   CHOLHDL 2.8 10/27/2017   Lab Results  Component Value Date   HGBA1C 4.7 (L) 10/27/2017   No results found for: "VITAMINB12" No results found for:  "TSH"      No data to display               No data to display           ASSESSMENT AND PLAN  61 y.o. year old female  has a past medical history of Allergy, Anxiety, Breast calcification, right, Cancer ([place]), Constipation, Depression, Headache, Headache, migraine (07/04/2014), High cholesterol, Hyperlipidemia, Hypertension, Personal history of radiation therapy (2016), Radiation (07/13/15-08/03/15), and Seizures ([place]) (10/26/2017). here with    Seizure disorder ([place]) - Plan: levETIRAcetam (KEPPRA) 500 MG tablet  OSA treated with BiPAP  [name] is doing well. She had a breakthrough nocturnal seizure about two weeks ago in the setting of missed AED. She will resume levetiracetam  59m twice daily. She is followed closely by PCP and now has [facility] patient assistance. She may request refills through PCP if willing. She was educated on driving restrictions. She uses public transportation. She will continue BiPAP nightly for at least 4 hours. Compliance report shows excellent compliance. We have discussed air leak and elevated AHI. She plans to change mask this week. She will call me if AHI or leak does not improve. Healthy lifestyle habits encouraged. She will follow up with PCP as directed. She will return to see me in 1 year, sooner if needed. She verbalizes understanding and agreement with this plan.   No orders of the defined types were placed in this encounter.    Meds ordered this encounter  Medications   levETIRAcetam (KEPPRA) 500 MG tablet    Sig: Take 1 tablet (500 mg total) by mouth 2 (two) times daily.    Dispense:  180 tablet    Refill:  3    Order Specific Question:   Supervising Provider    Answer:   AMelvenia Beam[[[id number]    ADebbora Presto MSN, FNP-C 05/10/2022, 9:51 AM  [facility] [address] [place] [phone]

## 2022-05-05 NOTE — Patient Instructions (Signed)
Below is our plan:  We will continue levetiracetam '500mg'$  twice daily  Please continue using your BiPAP regularly. While your insurance requires that you use BiPAP at least 4 hours each night on 70% of the nights, I recommend, that you not skip any nights and use it throughout the night if you can. Getting used to BiPAP and staying with the treatment long term does take time and patience and discipline. Untreated obstructive sleep apnea when it is moderate to severe can have an adverse impact on cardiovascular health and raise her risk for heart disease, arrhythmias, hypertension, congestive heart failure, stroke and diabetes. Untreated obstructive sleep apnea causes sleep disruption, nonrestorative sleep, and sleep deprivation. This can have an impact on your day to day functioning and cause daytime sleepiness and impairment of cognitive function, memory loss, mood disturbance, and problems focussing. Using BiPAP regularly can improve these symptoms.   Please try to change out mask regularly.   Please make sure you are staying well hydrated. I recommend 50-60 ounces daily. Well balanced diet and regular exercise encouraged. Consistent sleep schedule with 6-8 hours recommended.   Please continue follow up with care team as directed.   Follow up with me in 1 year. If PCP willing to refill levetiracetam, you may see me as needed.   You may receive a survey regarding today's visit. I encourage you to leave honest feed back as I do use this information to improve patient care. Thank you for seeing me today!

## 2022-05-10 ENCOUNTER — Ambulatory Visit (INDEPENDENT_AMBULATORY_CARE_PROVIDER_SITE_OTHER): Payer: Self-pay | Admitting: Family Medicine

## 2022-05-10 ENCOUNTER — Encounter: Payer: Self-pay | Admitting: Family Medicine

## 2022-05-10 VITALS — BP 135/90 | HR 72 | Ht 62.5 in | Wt 166.8 lb

## 2022-05-10 DIAGNOSIS — Z9989 Dependence on other enabling machines and devices: Secondary | ICD-10-CM

## 2022-05-10 DIAGNOSIS — G4733 Obstructive sleep apnea (adult) (pediatric): Secondary | ICD-10-CM

## 2022-05-10 DIAGNOSIS — G40909 Epilepsy, unspecified, not intractable, without status epilepticus: Secondary | ICD-10-CM

## 2022-05-10 MED ORDER — LEVETIRACETAM 500 MG PO TABS: 500.0000 mg | ORAL_TABLET | Freq: Two times a day (BID) | ORAL | 3 refills | Status: DC

## 2022-05-16 ENCOUNTER — Telehealth: Payer: Self-pay | Admitting: Family Medicine

## 2022-05-16 DIAGNOSIS — G40909 Epilepsy, unspecified, not intractable, without status epilepticus: Secondary | ICD-10-CM

## 2022-05-16 MED ORDER — LEVETIRACETAM 500 MG PO TABS: 500.0000 mg | ORAL_TABLET | Freq: Two times a day (BID) | ORAL | 3 refills | Status: DC

## 2022-05-16 NOTE — Telephone Encounter (Signed)
E-scribed refill 

## 2022-05-16 NOTE — Telephone Encounter (Signed)
Pt is calling. Requesting a refill on levETIRAcetam (KEPPRA) 500 MG tablet. Requesting refill be sent to [facility] #[id number]   .

## 2022-07-15 ENCOUNTER — Telehealth: Payer: Self-pay

## 2022-07-15 NOTE — Telephone Encounter (Signed)
Telephoned patient at mobile number to schedule with [facility]. Patient has [place] Medicaid. Patient will schedule mammogram using this coverage.

## 2022-08-18 ENCOUNTER — Other Ambulatory Visit: Payer: Self-pay | Admitting: Physician Assistant

## 2022-08-18 DIAGNOSIS — Z1231 Encounter for screening mammogram for malignant neoplasm of breast: Secondary | ICD-10-CM

## 2022-09-02 ENCOUNTER — Ambulatory Visit
Admission: RE | Admit: 2022-09-02 | Discharge: 2022-09-02 | Disposition: A | Discharge status: Home / Self Care | Payer: No Typology Code available for payment source | Source: Ambulatory Visit | Attending: Physician Assistant | Admitting: Physician Assistant

## 2022-09-02 DIAGNOSIS — Z1231 Encounter for screening mammogram for malignant neoplasm of breast: Secondary | ICD-10-CM

## 2022-09-02 HISTORY — DX: Malignant neoplasm of unspecified site of unspecified female breast: C50.919

## 2022-10-11 ENCOUNTER — Ambulatory Visit: Payer: No Typology Code available for payment source

## 2023-05-09 NOTE — Patient Instructions (Signed)
Please continue using your BiPAP regularly. While your insurance requires that you use BiPAP at least 4 hours each night on 70% of the nights, I recommend, that you not skip any nights and use it throughout the night if you can. Getting used to BiPAP and staying with the treatment long term does take time and patience and discipline. Untreated obstructive sleep apnea when it is moderate to severe can have an adverse impact on cardiovascular health and raise her risk for heart disease, arrhythmias, hypertension, congestive heart failure, stroke and diabetes. Untreated obstructive sleep apnea causes sleep disruption, nonrestorative sleep, and sleep deprivation. This can have an impact on your day to day functioning and cause daytime sleepiness and impairment of cognitive function, memory loss, mood disturbance, and problems focussing. Using BiPAP regularly can improve these symptoms.  We will update supply orders, today. You are eligible for a new machine. I will order a repeat sleep study that you will do at home. Please listen out for a call from our sleep lab staff to schedule. Once you complete study, our sleep doctors with evaluate the data and we will use that to order a new machine as indicated. This process could take a few weeks. Once new machine is ordered, you will hear back from your DME company to schedule set up of your new machine.   We will need to see you back within 61-90 days following set up of your new BiPAP to document compliance as required by your insurance company. Please call the office to schedule follow up when you receive your new machine. Please feel free to reach out with any questions or concerns.

## 2023-05-09 NOTE — Progress Notes (Unsigned)
No chief complaint on file.   HISTORY OF PRESENT ILLNESS:  05/09/23 ALL:  Eileen Holland returns for follow up for seizures and OSA on BIPAP. She continues levetiracetam 500mg  twice daily.     05/10/2022 ALL: Eileen Holland is a 62 y.o. female here today for follow up for seizures and OSA on BiPAP. She was last seen by Aundra Millet, NP 12/2019 and doing well on levetiracetam 500mg  BID. She reports doing well over the past two years until 2 weeks ago. She ran out of levetiracetam for 24 hours and reports having a breakthrough seizure. She states that she woke up with blood on her pillow and her tongue was sore. She had brain fog that resolved shortly after she woke up. All previous seizures have been nocturnal. PCP was filling med for her but requested she return to [place] for a visit. She is able to drive but does not have a car. She uses public transportation.   She continues BiPAP every night. She is doing well on therapy. She does note a significant leak but reports it is due to not being able to change out mask regularly. She is uninsured. She is unable to afford replacement supplies regularly. Her daughter is helping her and she plans to replace mask this week. She denies concerns with machine.     12/25/2019 MM (mychart): Eileen Holland is a 62 year old female with a history of obstructive sleep apnea on CPAP.  Her download indicates that she use her machine nightly for compliance of 100%.  She has her machine greater than 4 hours each night.  On average she uses her machine 7 hours and 7 minutes.  Her residual AHI is 0.5 on 21/16 centimeters of water with pressure support of 4 cm of water.  Her leak in the 95th percentile is 90.7 L/min.  Her leak has remained high despite several mask refitting's.  She is currently happy with her current mask.   Seizures: Denies any seizure events.  Continues on Keppra 500 mg twice a day   07/29/19 ALL (mychart): Eileen Holland is a 62 y.o. female here today for follow up  for  seizure. She continues levetiracetam 500mg  twice daily.  She is tolerating medication well with no obvious adverse effects.  She denies any further seizure activity.  She is followed by Dr Frances Furbish for OSA on BiPAP. She was last seen in 04/2019 with follow up recommended in 1 year.  Compliance reports reviewed today for dates 06/29/2019 through 07/28/2019 reveals excellent compliance.  She is using BiPAP therapy every day.  29 of the last 30 days she used BiPAP therapy greater than 4 hours.  On average 7 hours and 5 minutes.  Residual AHI was 0.2 with IPAP of 21 cm of water and EPAP of 16 cm of water.  Pressure support of 4 cm of water.  She continues to have an elevated leak in the 95th percentile of 95.2.  She has had multiple mask refitting's but feels that current mask is the best option for her.  She feels that leak is related to restless sleeping.  She will continue to be mindful of a leak and ensure that mask and headgear are well fitting.   REVIEW OF SYSTEMS: Out of a complete 14 system review of symptoms, the patient complains only of the following symptoms, none and all other reviewed systems are negative.  ESS 6/24  ALLERGIES: Allergies  Allergen Reactions   Amoxicillin Other (See Comments)    Unknown reaction  Aspirin Hives   Ibuprofen Hives    HOME MEDICATIONS: Outpatient Medications Prior to Visit  Medication Sig Dispense Refill   ACETAMINOPHEN-BUTALBITAL 50-325 MG TABS Take 1 tablet by mouth every 4 (four) hours as needed (headache.).     ALPRAZolam (XANAX) 0.5 MG tablet Take by mouth.     amLODipine (NORVASC) 10 MG tablet Take 10 mg by mouth every evening. @@7 :30 pm     B Complex Vitamins (B COMPLEX PO) Take 1 tablet by mouth daily at 12 noon. (B complex-C-E-zinc)     Cholecalciferol (VITAMIN D HIGH POTENCY) 1000 units capsule Take 1,000 Units daily by mouth.      docusate sodium (COLACE) 100 MG capsule Take 100 mg by mouth 2 (two) times daily.     hydrochlorothiazide 25 MG  tablet Take 25 mg by mouth every morning.      HYDROcodone-acetaminophen (NORCO/VICODIN) 5-325 MG tablet Take 1-2 tablets by mouth every 6 (six) hours as needed. 12 tablet 0   ketoconazole (NIZORAL) 2 % cream Apply 1 application topically daily as needed for irritation.     letrozole (FEMARA) 2.5 MG tablet TAKE ONE TABLET BY MOUTH EVERY DAY 90 tablet 1   levETIRAcetam (KEPPRA) 500 MG tablet Take 1 tablet (500 mg total) by mouth 2 (two) times daily. 180 tablet 3   levocetirizine (XYZAL) 5 MG tablet Take 5 mg by mouth daily at 12 noon. For allergies      losartan (COZAAR) 100 MG tablet Take 100 mg by mouth every morning.     metoprolol succinate (TOPROL-XL) 100 MG 24 hr tablet Take 100 mg by mouth every morning. Take with or immediately following a meal.     PARoxetine (PAXIL) 20 MG tablet Take 20 mg by mouth every morning.       pravastatin (PRAVACHOL) 20 MG tablet Take 20 mg by mouth daily.      pravastatin (PRAVACHOL) 40 MG tablet Take by mouth.     predniSONE (DELTASONE) 50 MG tablet Take 1 tablet (50 mg total) by mouth daily. 5 tablet 0   Prenatal Vit-Fe Fumarate-FA (PRENATAL PO) Take 1 tablet by mouth daily at 12 noon.     No facility-administered medications prior to visit.     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  Advised if needs it more regularly, to follow-up and discuss management furtehr    High cholesterol    Hyperlipidemia    Hypertension    Personal history of radiation therapy 2016    Radiation 07/13/15-08/03/15   right breast 4256 cGy   Seizures (HCC) 10/26/2017     PAST SURGICAL HISTORY: Past Surgical History:  Procedure Laterality Date   BREAST BIOPSY Right 04/28/2015   BREAST LUMPECTOMY Right 06/09/2015   BREAST LUMPECTOMY WITH RADIOACTIVE SEED LOCALIZATION Right 06/09/2015   Procedure: RIGHT BREAST LUMPECTOMY WITH RADIOACTIVE SEED LOCALIZATION;  Surgeon: Harriette Bouillon, MD;  Location: [facility];  Service: General;  Laterality: Right;   COLON SURGERY  07/17/2014   COLONOSCOPY     KNEE ARTHROSCOPY Right 2000   POLYPECTOMY  07/17/2014   polyp removed surgicallty    VAGINAL HYSTERECTOMY  2012     FAMILY HISTORY: Family History  Problem Relation Age of Onset   Diabetes Mother    Hypertension Father    Lung cancer Father 26       former smoker   Colon polyps Father        unspecified number   Breast cancer Sister 13       negative BRCA testing in 2007   Stomach cancer Maternal Uncle        dx. 50s-early 60s   Heart attack Paternal Uncle    Colon cancer Neg Hx    Esophageal cancer Neg Hx    Rectal cancer Neg Hx      SOCIAL HISTORY: Social History   Socioeconomic History   Marital status: Single    Spouse name: Not on file   Number of children: 1   Years of education: Not on file   Highest education level: High school graduate  Occupational History   Not on file  Tobacco Use   Smoking status: Every Day    Current packs/day: 0.50    Average packs/day: 0.5 packs/day for 32.0 years (16.0 ttl pk-yrs)    Types: Cigarettes   Smokeless tobacco: Never  Vaping Use   Vaping status: Never Used  Substance and Sexual Activity   Alcohol use: Yes    Alcohol/week: 0.0 standard drinks of alcohol    Comment: occas - maybe 1 drink every 3 mos   Drug use: No   Sexual activity: Not Currently    Birth control/protection: Surgical  Other Topics Concern   Not on file  Social History Narrative   Not on file   Social Determinants of  Health   Financial Resource Strain: Not on File (12/02/2021)   Received from [employer], [place] [employer] Strain    Financial Resource Strain: 0  Food Insecurity: Not at Risk (07/13/2022)   Received from [place], [place]   Food Insecurity    Food: 1  Transportation Needs: Not at Risk (07/13/2022)   Received from [place], [employer] Needs    Transportation: 1  Physical Activity: Not on File (12/02/2021)   Received from [place], [place]   Physical Activity    Physical Activity: 0  Stress: Not on File (12/02/2021)   Received from [facility], [place]   Stress    Stress: 0  Social Connections: Not on File (04/24/2023)   Received from [facility]   Social Connections    Connectedness: 0  Intimate Partner Violence: Unknown (11/16/2021)   Received from [facility], [facility]   HITS    Physically Hurt: Not on file    Insult or Talk Down To: Not on file    Threaten Physical Harm: Not on file    Scream or Curse: Not on file     PHYSICAL EXAM  There were no vitals filed for this visit.  There is no height or weight on file to calculate BMI.  Generalized: Well developed, in no acute distress  Cardiology: normal rate and rhythm, no murmur auscultated  Respiratory: clear to auscultation bilaterally    Neurological examination  Mentation: Alert oriented to time, place, history taking. Follows all commands speech and language fluent Cranial nerve II-XII: Pupils were equal round reactive to  light. Extraocular movements were full, visual field were full on confrontational test. Facial sensation and strength were normal. Uvula tongue midline. Head turning and shoulder shrug  were normal and symmetric. Motor: The motor testing reveals 5 over 5 strength of all 4 extremities. Good symmetric motor tone is noted throughout.  Sensory: Sensory testing is intact to soft touch on all 4 extremities. No evidence of extinction is noted.  Coordination: Cerebellar testing reveals good finger-nose-finger  and heel-to-shin bilaterally.  Gait and station: Gait is normal. Tandem gait is normal. Romberg is negative. No drift is seen.  Reflexes: Deep tendon reflexes are symmetric and normal bilaterally.    DIAGNOSTIC DATA (LABS, IMAGING, TESTING) - I reviewed patient records, labs, notes, testing and imaging myself where available.  Lab Results  Component Value Date   WBC 16.6 (H) 10/03/2021   HGB 14.0 10/03/2021   HCT 40.2 10/03/2021   MCV 100.5 (H) 10/03/2021   PLT 234 10/03/2021      Component Value Date/Time   NA 135 10/03/2021 1029   NA 139 03/28/2017 1034   NA 138 09/27/2016 0820   K 3.4 (L) 10/03/2021 1029   K 4.0 03/28/2017 1034   K 3.5 09/27/2016 0820   CL 102 10/03/2021 1029   CL 103 03/28/2017 1034   CO2 25 10/03/2021 1029   CO2 30 03/28/2017 1034   CO2 27 09/27/2016 0820   GLUCOSE 139 (H) 10/03/2021 1029   GLUCOSE 110 03/28/2017 1034   BUN 10 10/03/2021 1029   BUN 11 03/28/2017 1034   BUN 8.6 09/27/2016 0820   CREATININE 0.85 10/03/2021 1029   CREATININE 0.96 07/04/2019 1045   CREATININE 1.1 03/28/2017 1034   CREATININE 0.9 09/27/2016 0820   CALCIUM 9.4 10/03/2021 1029   CALCIUM 9.8 03/28/2017 1034   CALCIUM 9.8 09/27/2016 0820   PROT 7.2 07/04/2019 1045   PROT 8.0 03/28/2017 1034   PROT 7.6 09/27/2016 0820   ALBUMIN 4.5 07/04/2019 1045   ALBUMIN 3.8 03/28/2017 1034   ALBUMIN 3.8 09/27/2016 0820   AST 19 07/04/2019 1045   AST 20 09/27/2016 0820   ALT 15 07/04/2019 1045   ALT 21 03/28/2017 1034   ALT 16 09/27/2016 0820   ALKPHOS 72 07/04/2019 1045   ALKPHOS 81 03/28/2017 1034   ALKPHOS 80 09/27/2016 0820   BILITOT 0.5 07/04/2019 1045   BILITOT 0.43 09/27/2016 0820   GFRNONAA >60 10/03/2021 1029   GFRNONAA >60 07/04/2019 1045   GFRAA >60 07/04/2019 1045   Lab Results  Component Value Date   CHOL 173 10/27/2017   HDL 61 10/27/2017   LDLCALC 86 10/27/2017   TRIG 132 10/27/2017   CHOLHDL 2.8 10/27/2017   Lab Results  Component Value Date    HGBA1C 4.7 (L) 10/27/2017   No results found for: "VITAMINB12" No results found for: "TSH"      No data to display               No data to display           ASSESSMENT AND PLAN  62 y.o. year old female  has a past medical history of Allergy, Anxiety, Breast calcification, right, Breast cancer (HCC), Cancer (HCC), Constipation, Depression, Headache, Headache, migraine (07/04/2014), High cholesterol, Hyperlipidemia, Hypertension, Personal history of radiation therapy (2016), Radiation (07/13/15-08/03/15), and Seizures (HCC) (10/26/2017). here with    No diagnosis found.  Eileen Holland is doing well. She had a breakthrough nocturnal seizure about two weeks ago in the setting of missed  AED. She will resume levetiracetam 500mg  twice daily. She is followed closely by PCP and now has [facility] patient assistance. She may request refills through PCP if willing. She was educated on driving restrictions. She uses public transportation. She will continue BiPAP nightly for at least 4 hours. Compliance report shows excellent compliance. We have discussed air leak and elevated AHI. She plans to change mask this week. She will call me if AHI or leak does not improve. Healthy lifestyle habits encouraged. She will follow up with PCP as directed. She will return to see me in 1 year, sooner if needed. She verbalizes understanding and agreement with this plan.   No orders of the defined types were placed in this encounter.    No orders of the defined types were placed in this encounter.    Shawnie Dapper, MSN, FNP-C 05/09/2023, 3:58 PM  [facility] [address], [address] [place], [place] [address] [phone]

## 2023-05-11 ENCOUNTER — Ambulatory Visit (INDEPENDENT_AMBULATORY_CARE_PROVIDER_SITE_OTHER): Payer: Medicaid Other | Admitting: Family Medicine

## 2023-05-11 ENCOUNTER — Encounter: Payer: Self-pay | Admitting: Family Medicine

## 2023-05-11 VITALS — BP 135/84 | HR 79 | Ht 60.2 in | Wt 151.0 lb

## 2023-05-11 DIAGNOSIS — G4733 Obstructive sleep apnea (adult) (pediatric): Secondary | ICD-10-CM | POA: Diagnosis not present

## 2023-05-11 DIAGNOSIS — G40909 Epilepsy, unspecified, not intractable, without status epilepticus: Secondary | ICD-10-CM

## 2023-05-11 MED ORDER — LEVETIRACETAM 500 MG PO TABS: 500.0000 mg | ORAL_TABLET | Freq: Two times a day (BID) | ORAL | 3 refills | Status: DC

## 2023-06-27 ENCOUNTER — Ambulatory Visit
Admission: EM | Admit: 2023-06-27 | Discharge: 2023-06-27 | Disposition: A | Discharge status: Home / Self Care | Payer: Medicaid Other | Attending: Family Medicine | Admitting: Family Medicine

## 2023-06-27 DIAGNOSIS — K047 Periapical abscess without sinus: Secondary | ICD-10-CM

## 2023-06-27 DIAGNOSIS — R220 Localized swelling, mass and lump, head: Secondary | ICD-10-CM | POA: Diagnosis not present

## 2023-06-27 MED ORDER — CLINDAMYCIN HCL 150 MG PO CAPS: ORAL_CAPSULE | ORAL | 0 refills | Status: DC

## 2023-06-27 MED ORDER — PREDNISONE 20 MG PO TABS: ORAL_TABLET | ORAL | 0 refills | Status: DC

## 2023-06-27 MED ORDER — HYDROCODONE-ACETAMINOPHEN 5-325 MG PO TABS: 1.0000 | ORAL_TABLET | Freq: Three times a day (TID) | ORAL | 0 refills | Status: AC | PRN

## 2023-06-27 NOTE — Discharge Instructions (Addendum)
Advised patient to take medications as directed with food to completion.  Advised patient to take prednisone with clindamycin until complete.  Advised may use Norco for severe dental pain.  Patient advised of sedative effects of this medication.  Encouraged to increase daily water intake to 64 ounces per day while taking these medications.  Advised patient to follow-up with dentist as soon as possible for further evaluation of dental infection.

## 2023-06-27 NOTE — ED Provider Notes (Signed)
[facility]    CSN: [id number] Arrival date & time: 06/27/23  1607      History   Chief Complaint Chief Complaint  Patient presents with   Dental Problem   Facial Swelling    HPI Eileen Holland is a 62 y.o. female.   HPI Pleasant 62 year old female presents with dental pain and accompanying facial swelling.  Reports left-sided facial swelling for 5 days.  Reports filling came out of her tooth on top left, after which tooth had cracked.  Patient reports just moving to this area and needs to find a dentist.  PMH significant for acute CVA, headache migraine, and current tobacco use.  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   Allergic rhinitis 03/20/2015   Colon polyp 03/20/2015   Adenomatous colon polyp s/p robotic right proximal colectomy 07/17/2014 07/17/2014   Headache, migraine 07/04/2014   HTN  (hypertension) 06/04/2013   HLD (hyperlipidemia) 06/04/2013   Current tobacco use 06/04/2013   Depression, major, in remission (HCC) 06/04/2013   Major depression in remission (HCC) 06/04/2013   BP (high blood pressure) 06/04/2013    Past Surgical History:  Procedure Laterality Date   BREAST BIOPSY Right 04/28/2015   BREAST LUMPECTOMY Right 06/09/2015   BREAST LUMPECTOMY WITH RADIOACTIVE SEED LOCALIZATION Right 06/09/2015   Procedure: RIGHT BREAST LUMPECTOMY WITH RADIOACTIVE SEED LOCALIZATION;  Surgeon: Harriette Bouillon, MD;  Location: [facility];  Service: General;  Laterality: Right;   COLON SURGERY  07/17/2014   COLONOSCOPY     KNEE ARTHROSCOPY Right 2000   POLYPECTOMY  07/17/2014   polyp removed surgicallty    VAGINAL HYSTERECTOMY  2012    OB History   No obstetric history on file.      Home Medications    Prior to Admission medications   Medication Sig Start Date End Date Taking? Authorizing Provider  clindamycin (CLEOCIN) 150 MG capsule Take 2 caps p.o. twice daily x 7 days 06/27/23  Yes Trevor Iha, FNP  HYDROcodone-acetaminophen (NORCO/VICODIN) 5-325 MG tablet Take 1 tablet by mouth every 8 (eight) hours as needed for up to 7 days. 06/27/23 07/04/23 Yes Trevor Iha, FNP  predniSONE (DELTASONE) 20 MG tablet Take 3 tabs PO daily x 5 days. 06/27/23  Yes Trevor Iha,  FNP  ACETAMINOPHEN-BUTALBITAL 50-325 MG TABS Take 1 tablet by mouth every 4 (four) hours as needed (headache.).    [provider]  amLODipine (NORVASC) 10 MG tablet Take 10 mg by mouth every evening. @@7 :30 pm    [provider]  B Complex Vitamins (B COMPLEX PO) Take 1 tablet by mouth daily at 12 noon. (B complex-C-E-zinc)    [provider]  Cholecalciferol (VITAMIN D HIGH POTENCY) 1000 units capsule Take 1,000 Units daily by mouth.     [provider]  docusate sodium (COLACE) 100 MG capsule Take 100 mg by mouth 2 (two) times daily.    [provider]  hydrochlorothiazide 25 MG tablet Take 25 mg by mouth every morning.     [provider]  ketoconazole (NIZORAL) 2 % cream Apply 1 application topically daily as needed for irritation.    [provider]  levETIRAcetam (KEPPRA) 500 MG tablet Take 1 tablet (500 mg total) by mouth 2 (two) times daily. 05/11/23   Lomax, Amy, NP  levocetirizine (XYZAL) 5 MG tablet Take 5 mg by mouth daily at 12 noon. For allergies     [provider]  losartan (COZAAR) 100 MG tablet Take 100 mg by mouth every morning.    [provider]  metoprolol succinate (TOPROL-XL) 100 MG 24 hr tablet Take 100 mg by mouth every morning. Take with or immediately following a meal.    [provider]  PARoxetine (PAXIL) 20 MG tablet Take 20 mg by mouth every morning.      [provider]  pravastatin (PRAVACHOL) 20 MG tablet Take 20 mg by mouth daily.  03/26/17   [provider]  pravastatin (PRAVACHOL) 40 MG tablet Take by mouth. 04/10/19   [provider]  Prenatal Vit-Fe Fumarate-FA (PRENATAL PO) Take 1 tablet by mouth daily at 12 noon.    [provider]    Family History Family History  Problem Relation Age of Onset   Diabetes Mother    Hypertension Father    Lung cancer Father 63       former smoker   Colon polyps Father        unspecified number   Breast cancer Sister 69       negative BRCA testing in 2007   Stomach cancer Maternal Uncle        dx. 50s-early 60s   Heart attack Paternal Uncle    Colon cancer Neg Hx    Esophageal cancer Neg Hx    Rectal cancer Neg Hx     Social History Social History   Tobacco Use   Smoking status: Every Day    Current packs/day: 0.50    Average packs/day: 0.5 packs/day for 32.0 years (16.0 ttl pk-yrs)    Types: Cigarettes   Smokeless tobacco: Never  Vaping Use   Vaping status: Never Used  Substance Use Topics   Alcohol use: Yes    Alcohol/week: 0.0 standard drinks of alcohol     Comment: occas - maybe 1 drink every 3 mos   Drug use: No     Allergies   Amoxicillin, Aspirin, and Ibuprofen   Review of Systems Review of Systems  HENT:  Positive for dental problem and facial swelling.   All other systems reviewed and are negative.    Physical Exam Triage Vital Signs ED Triage Vitals  Encounter Vitals Group     BP 06/27/23 1632 137/85     Systolic BP Percentile --  Diastolic BP Percentile --      Pulse Rate 06/27/23 1632 74     Resp 06/27/23 1632 16     Temp 06/27/23 1631 98.9 F (37.2 C)     Temp src --      SpO2 06/27/23 1632 98 %     Weight --      Height --      Head Circumference --      Peak Flow --      Pain Score 06/27/23 1629 6     Pain Loc --      Pain Education --      Exclude from Growth Chart --    No data found.  Updated Vital Signs BP 137/85   Pulse 74   Temp 98.9 F (37.2 C)   Resp 16   LMP 03/14/2011   SpO2 98%    Physical Exam Vitals and nursing note reviewed.  Constitutional:      Appearance: Normal appearance. She is normal weight.  HENT:     Head: Normocephalic and atraumatic.     Mouth/Throat:     Mouth: Mucous membranes are moist.     Pharynx: Oropharynx is clear.     Comments: Upper maxillary: Missing second molar (#15) and first molar (#14) erythematous gingival mucosa with significant soft tissue swelling of left cheek please see image below Eyes:     Extraocular Movements: Extraocular movements intact.     Conjunctiva/sclera: Conjunctivae normal.     Pupils: Pupils are equal, round, and reactive to light.  Cardiovascular:     Rate and Rhythm: Normal rate and regular rhythm.     Pulses: Normal pulses.     Heart sounds: Normal heart sounds.  Pulmonary:     Effort: Pulmonary effort is normal.     Breath sounds: Normal breath sounds. No wheezing, rhonchi or rales.  Musculoskeletal:        General: Normal range of motion.     Cervical back: Normal range of motion and neck supple.  Skin:     General: Skin is warm and dry.  Neurological:     General: No focal deficit present.     Mental Status: She is alert and oriented to person, place, and time. Mental status is at baseline.  Psychiatric:        Mood and Affect: Mood normal.        Behavior: Behavior normal.      UC Treatments / Results  Labs (all labs ordered are listed, but only abnormal results are displayed) Labs Reviewed - No data to display  EKG   Radiology No results found.  Procedures Procedures (including critical care time)  Medications Ordered in UC Medications - No data to display  Initial Impression / Assessment and Plan / UC Course  I have reviewed the triage vital signs and the nursing notes.  Pertinent labs & imaging results that were available during my care of the patient were reviewed by me and considered in my medical decision making (see chart for details).     MDM: 1.  Dental infection-Rx'd clindamycin 150 mg capsule: Take 2 caps p.o. twice daily x 7 days, Rx'd Norco 5/325 mg tablet: Take 1 tablet every 8 hours as needed for severe dental pain. 2.  Facial swelling-Rx'd prednisone 20 mg tablet: Take 3 tabs p.o. daily x 5 days. Advised patient to take medications as directed with food to completion.  Advised patient to take prednisone with clindamycin until complete.  Advised  may use Norco for severe dental pain.  Patient advised of sedative effects of this medication.  Encouraged to increase daily water intake to 64 ounces per day while taking these medications.  Advised patient to follow-up with dentist as soon as possible for further evaluation of dental infection.  Dental contact provided with this AVS this evening. Final Clinical Impressions(s) / UC Diagnoses   Final diagnoses:  Facial swelling  Dental infection     Discharge Instructions      Advised patient to take medications as directed with food to completion.  Advised patient to take prednisone with clindamycin until  complete.  Advised may use Norco for severe dental pain.  Patient advised of sedative effects of this medication.  Encouraged to increase daily water intake to 64 ounces per day while taking these medications.  Advised patient to follow-up with dentist as soon as possible for further evaluation of dental infection.     ED Prescriptions     Medication Sig Dispense Auth. Provider   HYDROcodone-acetaminophen (NORCO/VICODIN) 5-325 MG tablet Take 1 tablet by mouth every 8 (eight) hours as needed for up to 7 days. 21 tablet Trevor Iha, FNP   predniSONE (DELTASONE) 20 MG tablet Take 3 tabs PO daily x 5 days. 15 tablet Trevor Iha, FNP   clindamycin (CLEOCIN) 150 MG capsule Take 2 caps p.o. twice daily x 7 days 28 capsule Trevor Iha, FNP      I have reviewed the PDMP during this encounter.   Trevor Iha, FNP 06/27/23 1705

## 2023-06-27 NOTE — ED Triage Notes (Signed)
[facility] with left sided facial swelling for 5 days. Pt reports she had a filling fall out of her tooth on the top left then the tooth cracked and she has had pain and swelling since. Pt reports she just moved here and needs to find a dentist. Pt reports she has been taking her headache medication for the pain.

## 2023-09-08 ENCOUNTER — Other Ambulatory Visit: Payer: Self-pay | Admitting: Physician Assistant

## 2023-09-08 DIAGNOSIS — Z720 Tobacco use: Secondary | ICD-10-CM

## 2023-09-08 DIAGNOSIS — Z122 Encounter for screening for malignant neoplasm of respiratory organs: Secondary | ICD-10-CM

## 2023-09-08 DIAGNOSIS — Z1231 Encounter for screening mammogram for malignant neoplasm of breast: Secondary | ICD-10-CM

## 2023-10-04 ENCOUNTER — Other Ambulatory Visit: Payer: Medicaid Other

## 2024-02-22 ENCOUNTER — Encounter: Payer: Self-pay | Admitting: Neurology

## 2024-03-22 ENCOUNTER — Encounter: Payer: Self-pay | Admitting: Cardiology

## 2024-03-22 ENCOUNTER — Ambulatory Visit: Attending: Cardiology | Admitting: Cardiology

## 2024-03-22 VITALS — BP 130/86 | HR 56 | Ht 62.5 in | Wt 143.6 lb

## 2024-03-22 DIAGNOSIS — I10 Essential (primary) hypertension: Secondary | ICD-10-CM | POA: Diagnosis present

## 2024-03-22 NOTE — Patient Instructions (Addendum)
 Lab Work: Bmp Renin aldo Tsh  If you have labs (blood work) drawn today and your tests are completely normal, you will receive your results only by: MyChart Message (if you have MyChart) OR A paper copy in the mail If you have any lab test that is abnormal or we need to change your treatment, we will call you to review the results.  Testing/Procedures: Echo  Your physician has requested that you have an echocardiogram. Echocardiography is a painless test that uses sound waves to create images of your heart. It provides your doctor with information about the size and shape of your heart and how well your heart's chambers and valves are working. This procedure takes approximately one hour. There are no restrictions for this procedure. Please do NOT wear cologne, perfume, aftershave, or lotions (deodorant is allowed). Please arrive 15 minutes prior to your appointment time.  Please note: We ask at that you not bring children with you during ultrasound (echo/ vascular) testing. Due to room size and safety concerns, children are not allowed in the ultrasound rooms during exams. Our front office staff cannot provide observation of children in our lobby area while testing is being conducted. An adult accompanying a patient to their appointment will only be allowed in the ultrasound room at the discretion of the ultrasound technician under special circumstances. We apologize for any inconvenience.  RENAL US   Your physician has requested that you have a renal artery duplex. During this test, an ultrasound is used to evaluate blood flow to the kidneys. Allow one hour for this exam. Do not eat after midnight the day before and avoid carbonated beverages. Take your medications as you usually do.    Follow-Up: At [facility], you and your health needs are our priority.  As part of our continuing mission to provide you with exceptional heart care, our providers are all part of one team.  This  team includes your primary Cardiologist (physician) and Advanced Practice Providers or APPs (Physician Assistants and Nurse Practitioners) who all work together to provide you with the care you need, when you need it.  Your next appointment:   3 month(s)  Provider:   One of our Advanced Practice Providers (APPs): Morse Clause, PA-C  Lamarr Satterfield, NP Miriam Shams, NP  Olivia Pavy, PA-C Josefa Beauvais, NP  Leontine Salen, PA-C Orren Fabry, PA-C  [place], PA-C Ernest Dick, NP  Damien Braver, NP Jon Hails, PA-C  Waddell Donath, PA-C    Dayna Dunn, PA-C  Scott Weaver, PA-C Lum Louis, NP Katlyn West, NP Callie Goodrich, PA-C  Evan Williams, PA-C Sheng Haley, PA-C  Xika Zhao, NP Kathleen Johnson, PA-C    We recommend signing up for the patient portal called MyChart.  Sign up information is provided on this After Visit Summary.  MyChart is used to connect with patients for Virtual Visits (Telemedicine).  Patients are able to view lab/test results, encounter notes, upcoming appointments, etc.  Non-urgent messages can be sent to your provider as well.   To learn more about what you can do with MyChart, go to [email or web address].   Other Instructions Hypertension, Adult Hypertension is another name for high blood pressure. High blood pressure forces your heart to work harder to pump blood. This can cause problems over time. There are two numbers in a blood pressure reading. There is a top number (systolic) over a bottom number (diastolic). It is best to have a blood pressure that is below 120/80. What are the causes?  The cause of this condition is not known. Some other conditions can lead to high blood pressure. What increases the risk? Some lifestyle factors can make you more likely to develop high blood pressure: Smoking. Not getting enough exercise or physical activity. Being overweight. Having too much fat, sugar, calories, or salt (sodium) in your diet. Drinking too  much alcohol. Other risk factors include: Having any of these conditions: Heart disease. Diabetes. High cholesterol. Kidney disease. Obstructive sleep apnea. Having a family history of high blood pressure and high cholesterol. Age. The risk increases with age. Stress. What are the signs or symptoms? High blood pressure may not cause symptoms. Very high blood pressure (hypertensive crisis) may cause: Headache. Fast or uneven heartbeats (palpitations). Shortness of breath. Nosebleed. Vomiting or feeling like you may vomit (nauseous). Changes in how you see. Very bad chest pain. Feeling dizzy. Seizures. How is this treated? This condition is treated by making healthy lifestyle changes, such as: Eating healthy foods. Exercising more. Drinking less alcohol. Your doctor may prescribe medicine if lifestyle changes do not help enough and if: Your top number is above 130. Your bottom number is above 80. Your personal target blood pressure may vary. Follow these instructions at home: Eating and drinking  If told, follow the DASH eating plan. To follow this plan: Fill one half of your plate at each meal with fruits and vegetables. Fill one fourth of your plate at each meal with whole grains. Whole grains include whole-wheat pasta, brown rice, and whole-grain bread. Eat or drink low-fat dairy products, such as skim milk or low-fat yogurt. Fill one fourth of your plate at each meal with low-fat (lean) proteins. Low-fat proteins include fish, chicken without skin, eggs, beans, and tofu. Avoid fatty meat, cured and processed meat, or chicken with skin. Avoid pre-made or processed food. Limit the amount of salt in your diet to less than 1,500 mg each day. Do not drink alcohol if: Your doctor tells you not to drink. You are pregnant, may be pregnant, or are planning to become pregnant. If you drink alcohol: Limit how much you have to: 0-1 drink a day for women. 0-2 drinks a day for  men. Know how much alcohol is in your drink. In the [place] one drink equals one 12 oz bottle of beer (355 mL), one 5 oz glass of wine (148 mL), or one 1 oz glass of hard liquor (44 mL). Lifestyle  Work with your doctor to stay at a healthy weight or to lose weight. Ask your doctor what the best weight is for you. Get at least 30 minutes of exercise that causes your heart to beat faster (aerobic exercise) most days of the week. This may include walking, swimming, or biking. Get at least 30 minutes of exercise that strengthens your muscles (resistance exercise) at least 3 days a week. This may include lifting weights or doing Pilates. Do not smoke or use any products that contain nicotine or tobacco. If you need help quitting, ask your doctor. Check your blood pressure at home as told by your doctor. Keep all follow-up visits. Medicines Take over-the-counter and prescription medicines only as told by your doctor. Follow directions carefully. Do not skip doses of blood pressure medicine. The medicine does not work as well if you skip doses. Skipping doses also puts you at risk for problems. Ask your doctor about side effects or reactions to medicines that you should watch for. Contact a doctor if: You think you are having a reaction  to the medicine you are taking. You have headaches that keep coming back. You feel dizzy. You have swelling in your ankles. You have trouble with your vision. Get help right away if: You get a very bad headache. You start to feel mixed up (confused). You feel weak or numb. You feel faint. You have very bad pain in your: Chest. Belly (abdomen). You vomit more than once. You have trouble breathing. These symptoms may be an emergency. Get help right away. Call 911. Do not wait to see if the symptoms will go away. Do not drive yourself to the hospital. Summary Hypertension is another name for high blood pressure. High blood pressure forces your heart to work  harder to pump blood. For most people, a normal blood pressure is less than 120/80. Making healthy choices can help lower blood pressure. If your blood pressure does not get lower with healthy choices, you may need to take medicine. This information is not intended to replace advice given to you by your health care provider. Make sure you discuss any questions you have with your health care provider. Document Revised: 05/20/2021 Document Reviewed: 05/20/2021 Elsevier Patient Education  2024 Elsevier Inc[employer]DASH Eating Plan DASH stands for Dietary Approaches to Stop Hypertension. The DASH eating plan is a healthy eating plan that has been shown to: Lower high blood pressure (hypertension). Reduce your risk for type 2 diabetes, heart disease, and stroke. Help with weight loss. What are tips for following this plan? Reading food labels Check food labels for the amount of salt (sodium) per serving. Choose foods with less than 5 percent of the Daily Value (DV) of sodium. In general, foods with less than 300 milligrams (mg) of sodium per serving fit into this eating plan. To find whole grains, look for the word whole as the first word in the ingredient list. Shopping Buy products labeled as low-sodium or no salt added. Buy fresh foods. Avoid canned foods and pre-made or frozen meals. Cooking Try not to add salt when you cook. Use salt-free seasonings or herbs instead of table salt or sea salt. Check with your health care provider or pharmacist before using salt substitutes. Do not fry foods. Cook foods in healthy ways, such as baking, boiling, grilling, roasting, or broiling. Cook using oils that are good for your heart. These include olive, canola, avocado, soybean, and sunflower oil. Meal planning  Eat a balanced diet. This should include: 4 or more servings of fruits and 4 or more servings of vegetables each day. Try to fill half of your plate with fruits and vegetables. 6-8 servings of  whole grains each day. 6 or less servings of lean meat, poultry, or fish each day. 1 oz is 1 serving. A 3 oz (85 g) serving of meat is about the same size as the palm of your hand. One egg is 1 oz (28 g). 2-3 servings of low-fat dairy each day. One serving is 1 cup (237 mL). 1 serving of nuts, seeds, or beans 5 times each week. 2-3 servings of heart-healthy fats. Healthy fats called omega-3 fatty acids are found in foods such as walnuts, flaxseeds, fortified milks, and eggs. These fats are also found in cold-water fish, such as sardines, salmon, and mackerel. Limit how much you eat of: Canned or prepackaged foods. Food that is high in trans fat, such as fried foods. Food that is high in saturated fat, such as fatty meat. Desserts and other sweets, sugary drinks, and other foods with added sugar. Full-fat  dairy products. Do not salt foods before eating. Do not eat more than 4 egg yolks a week. Try to eat at least 2 vegetarian meals a week. Eat more home-cooked food and less restaurant, buffet, and fast food. Lifestyle When eating at a restaurant, ask if your food can be made with less salt or no salt. If you drink alcohol: Limit how much you have to: 0-1 drink a day if you are female. 0-2 drinks a day if you are female. Know how much alcohol is in your drink. In the [place] one drink is one 12 oz bottle of beer (355 mL), one 5 oz glass of wine (148 mL), or one 1 oz glass of hard liquor (44 mL). General information Avoid eating more than 2,300 mg of salt a day. If you have hypertension, you may need to reduce your sodium intake to 1,500 mg a day. Work with your provider to stay at a healthy body weight or lose weight. Ask what the best weight range is for you. On most days of the week, get at least 30 minutes of exercise that causes your heart to beat faster. This may include walking, swimming, or biking. Work with your provider or dietitian to adjust your eating plan to meet your specific  calorie needs. What foods should I eat? Fruits All fresh, dried, or frozen fruit. Canned fruits that are in their natural juice and do not have sugar added to them. Vegetables Fresh or frozen vegetables that are raw, steamed, roasted, or grilled. Low-sodium or reduced-sodium tomato and vegetable juice. Low-sodium or reduced-sodium tomato sauce and tomato paste. Low-sodium or reduced-sodium canned vegetables. Grains Whole-grain or whole-wheat bread. Whole-grain or whole-wheat pasta. Brown rice. [place]il Madeira. Bulgur. Whole-grain and low-sodium cereals. Pita bread. Low-fat, low-sodium crackers. Whole-wheat flour tortillas. Meats and other proteins Skinless chicken or [place]. Ground chicken or [place]. Pork with fat trimmed off. Fish and seafood. Egg whites. Dried beans, peas, or lentils. Unsalted nuts, nut butters, and seeds. Unsalted canned beans. Lean cuts of beef with fat trimmed off. Low-sodium, lean precooked or cured meat, such as sausages or meat loaves. Dairy Low-fat (1%) or fat-free (skim) milk. Reduced-fat, low-fat, or fat-free cheeses. Nonfat, low-sodium ricotta or cottage cheese. Low-fat or nonfat yogurt. Low-fat, low-sodium cheese. Fats and oils Soft margarine without trans fats. Vegetable oil. Reduced-fat, low-fat, or light mayonnaise and salad dressings (reduced-sodium). Canola, safflower, olive, avocado, soybean, and sunflower oils. Avocado. Seasonings and condiments Herbs. Spices. Seasoning mixes without salt. Other foods Unsalted popcorn and pretzels. Fat-free sweets. The items listed above may not be all the foods and drinks you can have. Talk to a dietitian to learn more. What foods should I avoid? Fruits Canned fruit in a light or heavy syrup. Fried fruit. Fruit in cream or butter sauce. Vegetables Creamed or fried vegetables. Vegetables in a cheese sauce. Regular canned vegetables that are not marked as low-sodium or reduced-sodium. Regular canned tomato sauce and paste  that are not marked as low-sodium or reduced-sodium. Regular tomato and vegetable juices that are not marked as low-sodium or reduced-sodium. Dene. Olives. Grains Baked goods made with fat, such as croissants, muffins, or some breads. Dry pasta or rice meal packs. Meats and other proteins Fatty cuts of meat. Ribs. Fried meat. [place]. Bologna, salami, and other precooked or cured meats, such as sausages or meat loaves, that are not lean and low in sodium. Fat from the back of a pig (fatback). Bratwurst. Salted nuts and seeds. Canned beans with added salt. Canned  or smoked fish. Whole eggs or egg yolks. Chicken or [place] with skin. Dairy Whole or 2% milk, cream, and half-and-half. Whole or full-fat cream cheese. Whole-fat or sweetened yogurt. Full-fat cheese. Nondairy creamers. Whipped toppings. Processed cheese and cheese spreads. Fats and oils Butter. Stick margarine. Lard. Shortening. Ghee. Bacon fat. Tropical oils, such as coconut, palm kernel, or palm oil. Seasonings and condiments Onion salt, garlic salt, seasoned salt, table salt, and sea salt. Worcestershire sauce. Tartar sauce. Barbecue sauce. Teriyaki sauce. Soy sauce, including reduced-sodium soy sauce. Steak sauce. Canned and packaged gravies. Fish sauce. Oyster sauce. Cocktail sauce. Store-bought horseradish. Ketchup. Mustard. Meat flavorings and tenderizers. Bouillon cubes. Hot sauces. Pre-made or packaged marinades. Pre-made or packaged taco seasonings. Relishes. Regular salad dressings. Other foods Salted popcorn and pretzels. The items listed above may not be all the foods and drinks you should avoid. Talk to a dietitian to learn more. Where to find more information National Heart, Lung, and Blood Institute (NHLBI): BuffaloDryCleaner.gl American Heart Association (AHA): heart.org Academy of Nutrition and Dietetics: eatright.org National Kidney Foundation (NKF): kidney.org This information is not intended to replace advice given to you by  your health care provider. Make sure you discuss any questions you have with your health care provider. Document Revised: 08/18/2022 Document Reviewed: 08/18/2022 Elsevier Patient Education  2024 [employer].

## 2024-03-22 NOTE — Progress Notes (Signed)
 Cardiology Office Note:  .   Date:  03/22/2024  ID:  Eileen Holland, DOB 03/11/1961, MRN [id number] PCP: Buck Search, PA-C  [facility] Providers Cardiologist:  Newman Lawrence, MD PCP: Buck Search, PA-C  Chief Complaint  Patient presents with   resistant hypertension     Eileen Holland is a 63 y.o. female with hypertension  Discussed the use of AI scribe software for clinical note transcription with the patient, who gave verbal consent to proceed.  History of Present Illness Eileen Holland is a 63 year old female with hypertension who presents for management of her blood pressure.  Her hypertension was diagnosed 12 to 13 years ago. Current medications include amlodipine  10 mg, hydrochlorothiazide  25 mg, losartan  100 mg, metoprolol  succinate 100 mg once daily, and clonidine 0.1 mg twice daily. A new medication was added three weeks ago. She also takes pravastatin 40 mg for cholesterol. Home blood pressure readings are around 130/90 mmHg, with a high of 150/100 mmHg. Blood pressure has decreased slightly since starting a potassium supplement.  Family history is significant for hypertension, as her father had high blood pressure. She smokes half a pack of cigarettes per day and engages in limited physical activity, primarily house cleaning. She does not exercise regularly. Alcohol consumption is minimal, limited to twice a year.  She experiences poor sleep quality due to depression and nocturia. She uses a CPAP machine for snoring and sleep apnea. No chest pain, shortness of breath, lightheadedness, or syncope.      Vitals:   03/22/24 1050  BP: 130/86  Pulse: (!) 56  SpO2: 93%      Review of Systems  Cardiovascular:  Negative for chest pain, dyspnea on exertion, leg swelling, palpitations and syncope.        Studies Reviewed: SABRA        EKG 03/22/2024: Sinus rhythm 56 bpm Possible Left atrial enlargement Nonspecific T wave abnormality When compared with ECG of  26-Oct-2017 13:44, No significant change was found  Labs 06/2022: Chol 177, TG 76, HDL 65, LDL 98 HbA1C 5.4%  09/2021: Hb 14, MCV 100.5 Cr 0.85, K 3.4   Physical Exam Vitals and nursing note reviewed.  Constitutional:      General: She is not in acute distress. Neck:     Vascular: No JVD.  Cardiovascular:     Rate and Rhythm: Normal rate and regular rhythm.     Heart sounds: Normal heart sounds. No murmur heard. Pulmonary:     Effort: Pulmonary effort is normal.     Breath sounds: Normal breath sounds. No wheezing or rales.  Musculoskeletal:     Right lower leg: No edema.     Left lower leg: No edema.      VISIT DIAGNOSES:   ICD-10-CM   1. Primary hypertension  I10 EKG 12-Lead    Basic metabolic panel with GFR    Aldosterone + renin activity w/ ratio    ECHOCARDIOGRAM COMPLETE    VAS US  RENAL ARTERY DUPLEX    TSH    Aldosterone + renin activity w/ ratio       Eileen Holland is a 63 y.o. female with hypertension Assessment & Plan Hypertension: Hypertension for 12-13 years, on multiple antihypertensives. Home readings improved but not optimal. Recent clonidine addition. Lack of physical activity and high salt intake may contribute. Further evaluation for secondary causes warranted. - Order blood tests for kidney function, renin, aldosterone, and thyroid function. - Order ultrasound of heart and kidney arteries. - Advise  increasing physical activity to 10,000 steps per day. - Recommend low-salt diet, avoiding pre-prepared frozen meals, canned food, fries, chips, and pickles. - Advise reducing caffeine intake, including decaffeinated coffee.    F/u in 3 months If no secondary cause found and blood pressure better controlled, we can then see her as needed.  Signed, Newman [name], MD

## 2024-03-23 LAB — BASIC METABOLIC PANEL WITH GFR
BUN/Creatinine Ratio: 11 — ABNORMAL LOW (ref 12–28)
BUN: 10 mg/dL (ref 8–27)
CO2: 25 mmol/L (ref 20–29)
Calcium: 9.6 mg/dL (ref 8.7–10.3)
Chloride: 102 mmol/L (ref 96–106)
Creatinine, Ser: 0.95 mg/dL (ref 0.57–1.00)
Glucose: 91 mg/dL (ref 70–99)
Potassium: 3.8 mmol/L (ref 3.5–5.2)
Sodium: 142 mmol/L (ref 134–144)
eGFR: 68 mL/min/1.73 (ref >59)

## 2024-03-23 LAB — TSH: TSH: 1.47 u[IU]/mL (ref 0.450–4.500)

## 2024-03-27 ENCOUNTER — Ambulatory Visit: Payer: Self-pay | Admitting: *Deleted

## 2024-03-27 DIAGNOSIS — I351 Nonrheumatic aortic (valve) insufficiency: Secondary | ICD-10-CM

## 2024-03-27 DIAGNOSIS — I10 Essential (primary) hypertension: Secondary | ICD-10-CM

## 2024-04-01 LAB — ALDOSTERONE + RENIN ACTIVITY W/ RATIO
Aldos/Renin Ratio: >73.1 — AB (ref 0.0–30.0)
Aldosterone: 12.2 ng/dL (ref 0.0–30.0)
Renin Activity, Plasma: <0.167 ng/mL/h — AB (ref 0.167–5.380)

## 2024-04-03 NOTE — Progress Notes (Signed)
 High likelihood of primary hyperaldosteronism. If blood pressure is currently controlled, I would recommend referral to hypertension clinic to try to get her on spironolactone and wean off certain other medications, as spironolactone works better in this situation.  May also need some imaging.  Refer to hypertension clinic, if not already done so, for resistant hypertension.  Thanks MJP

## 2024-04-09 ENCOUNTER — Other Ambulatory Visit: Payer: Self-pay | Admitting: Cardiology

## 2024-04-09 DIAGNOSIS — I10 Essential (primary) hypertension: Secondary | ICD-10-CM

## 2024-04-10 ENCOUNTER — Ambulatory Visit (HOSPITAL_BASED_OUTPATIENT_CLINIC_OR_DEPARTMENT_OTHER)
Admission: RE | Admit: 2024-04-10 | Discharge: 2024-04-10 | Disposition: A | Discharge status: Home / Self Care | Source: Ambulatory Visit | Attending: Cardiology | Admitting: Cardiology

## 2024-04-10 ENCOUNTER — Ambulatory Visit (HOSPITAL_COMMUNITY)
Admission: RE | Admit: 2024-04-10 | Discharge: 2024-04-10 | Disposition: A | Discharge status: Home / Self Care | Source: Ambulatory Visit | Attending: Cardiology | Admitting: Cardiology

## 2024-04-10 DIAGNOSIS — I10 Essential (primary) hypertension: Secondary | ICD-10-CM | POA: Diagnosis not present

## 2024-04-10 LAB — ECHOCARDIOGRAM COMPLETE
AR max vel: 1.53 cm2
AV Area VTI: 1.54 cm2
AV Area mean vel: 1.49 cm2
AV Mean grad: 3 mmHg
AV Peak grad: 5.3 mmHg
Ao pk vel: 1.15 m/s
Area-P 1/2: 4.31 cm2
P 1/2 time: 457 ms
S' Lateral: 2.39 cm

## 2024-04-11 NOTE — Progress Notes (Signed)
 No renal artery stenosis. Is she going to see hypertension clinic? 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  Thanks MJP

## 2024-04-11 NOTE — Progress Notes (Signed)
 See separate message re: renal artery duplex results. Normal pumping function of the heart. No severe heart valve abnormalities noted.  Mild aortic regurgitation. Recommend repeat echocardiogram in 08/2025.  Thanks MJP

## 2024-04-12 NOTE — Progress Notes (Signed)
Pt contacted and advised.

## 2024-05-01 ENCOUNTER — Ambulatory Visit (INDEPENDENT_AMBULATORY_CARE_PROVIDER_SITE_OTHER): Admitting: Neurology

## 2024-05-01 ENCOUNTER — Encounter: Payer: Self-pay | Admitting: Neurology

## 2024-05-01 VITALS — BP 142/89 | HR 69 | Ht 62.5 in | Wt 143.0 lb

## 2024-05-01 DIAGNOSIS — R569 Unspecified convulsions: Secondary | ICD-10-CM | POA: Diagnosis not present

## 2024-05-01 DIAGNOSIS — R42 Dizziness and giddiness: Secondary | ICD-10-CM | POA: Diagnosis not present

## 2024-05-01 DIAGNOSIS — R519 Headache, unspecified: Secondary | ICD-10-CM

## 2024-05-01 DIAGNOSIS — R413 Other amnesia: Secondary | ICD-10-CM | POA: Diagnosis not present

## 2024-05-01 MED ORDER — LEVETIRACETAM 500 MG PO TABS: 500.0000 mg | ORAL_TABLET | Freq: Two times a day (BID) | ORAL | 3 refills | Status: DC

## 2024-05-01 NOTE — Progress Notes (Signed)
 NEUROLOGY CONSULTATION NOTE  Eileen Holland MRN: [id number] DOB: 06-19-1961  Referring provider: Annabella Rigg, FNP Primary care provider: Joesph Porch, PA-C  Reason for consult:  establish care for seizures, migraines, tremors   Thank you for your kind referral of Eileen Holland for consultation of the above symptoms. Although her history is well known to you, please allow me to reiterate it for the purpose of our medical record. She is alone in the office today. Records and images were personally reviewed where available.  HISTORY OF PRESENT ILLNESS: This is a 63 year old right-handed woman with a history of hypertension, hyperlipidemia, breast cancer s/p lumpectomy and radiation, OSA, and left temporal lobe epilepsy. She reports several concerns today, particularly new onset headaches, dizziness, and memory loss.  Seizures. Records from [facility] were reviewed. The first seizure occurred in 10/2017. She only remembers pieces of it, she got up that morning, got ready for work, and took 2 buses to get to work. She vaguely recalls being unable to get herself together on the computer at work so she left. Her daughter picked her up at the bus station. When she got home, she found her pillow was bloodied and noticed her tongue was slit in half. She was admitted to [facility] where brain MRI without contrast did not show any acute changes, hippocampi symmetric. There were nonspecific white matter changes. CTA head and neck no large vessel stenosis. Her EEG showed occasional left frontotemporal spike and wave discharges. She was discharged home on Levetiracetam  500mg  BID. Since then, she reports only 2 nocturnal seizures both in the setting of medication issues. One nocturnal seizure was witnessed by her granddaughter with report of body shaking. The last one was in 04/2022 when she ran out of LEV for 24 hours and woke up with blood on her pillow, tongue sore, brain fog that resolved soon after she woke up. She denies any  staring/unresponsive episodes, gaps in time, olfactory/gustatory hallucinations, rising epigastric sensation, focal numbness/tingling/weakness, myoclonic jerks. No side effects on LEV. She had a normal birth and early development.  There is no history of febrile convulsions, CNS infections such as meningitis/encephalitis, significant traumatic brain injury, neurosurgical procedures, or family history of seizures.  2.   Headaches and dizziness. She has a history of migraines all my life with visual symptoms of seeing stars. However in the past few weeks, she has had a constant headache that is different from her typical migraines. She has a weird headache on the back of her head where head and neck meet, as well as a pain above the left eye, no tenderness. Pain has been constant, she takes Tylenol  3 times a week and rarely Butalbital if the Tylenol  does not calm down the pain. No nausea/vomiting. Two weeks ago, she had visual changes seeing something similar to a bar code on both eyes (covered each eye and still present) lasting an hour. Since the headaches started, she has also been having a different type of dizziness. There is no spinning sensation. She calls it a clicking when she moves her eyeballs or head to either side. She has difficulty describing the sensation, the inside of her head is getting heavy. She feels like she will get lightheaded but not all the way. It affects her balance, she is concerned about walking on stairs. On primary position she is fine. Interestingly, she reports that when she was off Paxil  for a week, the clicking was worse. She restarted it last Saturday. There is no pain, no  tinnitus. Her granddaughter tells her she has hearing loss but she has not noticed. No neck pain. No diplopia, dysarthria/dysphagia, focal numbness/tingling/weakness. No recent head injuries or infections, no new medications. She has chronic back pain and constipation. Her sister and daughter have  migraines.  3. Tremors. She has had hand tremors in both hands, they come and go, noticeable when she is writing. They are more when she wakes up in the morning and quiets down during the day. It does not affect using utensils or drinking from a cup. Her mother has dementia and some tremors. TSH normal.  4. Memory changes. She started noticing short-term memory changes a year ago. She lives with her daughter and grandchildren. She denies getting lost driving or missing medications. Her daughter manages finances. She denies leaving the stove on. She reports moving to [place] in October, when family talks about incidents from their prior home, she cannot recall them. Her mother and maternal aunt have dementia. She gets 5 hours of sleep with her CPAP machine. Mood is irritated due to the clicking.     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  Advised if needs it more regularly, to follow-up and discuss management furtehr    High cholesterol    Hyperlipidemia    Hypertension    Personal history of radiation therapy 2016   Radiation 07/13/15-08/03/15   right breast 4256 cGy   Seizures (HCC) 10/26/2017    PAST SURGICAL HISTORY: Past Surgical History:  Procedure Laterality Date   BREAST BIOPSY Right 04/28/2015   BREAST LUMPECTOMY Right 06/09/2015   BREAST LUMPECTOMY WITH RADIOACTIVE SEED LOCALIZATION Right 06/09/2015   Procedure: RIGHT BREAST LUMPECTOMY WITH RADIOACTIVE SEED LOCALIZATION;  Surgeon: Debby Shipper, MD;  Location:  SURGERY CENTER;  Service: General;  Laterality: Right;   COLON SURGERY  07/17/2014   COLONOSCOPY     KNEE ARTHROSCOPY Right 2000   POLYPECTOMY  07/17/2014   polyp removed surgicallty    VAGINAL HYSTERECTOMY  2012    MEDICATIONS: Current Outpatient Medications on File Prior to Visit  Medication Sig Dispense Refill   ACETAMINOPHEN -BUTALBITAL 50-325 MG TABS Take 1 tablet by mouth every 4 (four) hours as needed (headache.).     amLODipine  (NORVASC ) 10 MG tablet Take 10 mg by mouth every evening. @@7 :30 pm     B Complex Vitamins (B COMPLEX PO) Take 1 tablet by mouth daily at 12 noon. (B complex-C-E-zinc)     cetirizine (ZYRTEC) 10 MG tablet Take 10 mg by mouth daily.     cloNIDine (CATAPRES) 0.1 MG tablet Take 0.1 mg by mouth 2 (two) times daily.     hydrochlorothiazide  25 MG tablet Take 25 mg by mouth every morning.      levETIRAcetam  (KEPPRA ) 500 MG tablet Take 1 tablet (500 mg total) by mouth 2 (two) times daily. 180 tablet 3   levocetirizine (XYZAL ) 5 MG tablet  Take 5 mg by mouth daily at 12 noon. For allergies      losartan  (COZAAR ) 100 MG tablet Take 100 mg by mouth every morning.     metoprolol  succinate (TOPROL -XL) 100 MG 24 hr tablet Take 100 mg by mouth every morning. Take with or immediately following a meal.     PARoxetine  (PAXIL ) 20 MG tablet Take 20 mg by mouth every morning.       potassium chloride  (KLOR-CON ) 10 MEQ tablet Take 10 mEq by mouth daily.     pravastatin (PRAVACHOL) 40 MG tablet Take by mouth.     Prenatal Vit-Fe Fumarate-FA (PRENATAL PO) Take 1 tablet by mouth daily at 12 noon.     No current facility-administered medications on file prior to visit.    ALLERGIES: Allergies  Allergen Reactions   Amoxicillin Other (See Comments)    Unknown reaction   Aspirin Hives   Ibuprofen Hives    FAMILY HISTORY: Family History  Problem Relation Age of Onset   Diabetes Mother    Dementia Mother    Hypertension Father    Lung  cancer Father 54       former smoker   Colon polyps Father        unspecified number   Breast cancer Sister 37       negative BRCA testing in 2007   Stomach cancer Maternal Uncle        dx. 50s-early 60s   Heart attack Paternal Uncle    Colon cancer Neg Hx    Esophageal cancer Neg Hx    Rectal cancer Neg Hx     SOCIAL HISTORY: Social History   Socioeconomic History   Marital status: Single    Spouse name: Not on file   Number of children: 1   Years of education: Not on file   Highest education level: High school graduate  Occupational History   Not on file  Tobacco Use   Smoking status: Every Day    Current packs/day: 0.50    Average packs/day: 0.5 packs/day for 32.0 years (16.0 ttl pk-yrs)    Types: Cigarettes   Smokeless tobacco: Never  Vaping Use   Vaping status: Never Used  Substance and Sexual Activity   Alcohol use: Yes    Alcohol/week: 0.0 standard drinks of alcohol    Comment: occas - maybe 1 drink every 3 mos   Drug use: No   Sexual activity: Not Currently    Birth control/protection: Surgical  Other Topics Concern   Not on file  Social History Narrative   Living w/ daughter-2 story home 15 steps   Right handed   Caffeine 2 cups a day   Social Drivers of Health   Financial Resource Strain: Not on File (12/02/2021)   Received from [employer]    Financial Resource Strain: 0  Food Insecurity: At Risk (09/08/2023)   Received from [facility] Insecurity    Within the past 12 months, you worried that your food would run out before you got money to buy more.: 2  Transportation Needs: At Risk (09/08/2023)   Received from [facility] Needs    In the past 12 months, has lack of transportation kept you from medical appointments, meetings, work or from getting things needed for daily living?: 2  Physical Activity: Not on File (12/02/2021)   Received from [facility]   Physical Activity    Physical Activity: 0  Stress: Not on File  (  12/02/2021)   Received from [facility]   Stress    Stress: 0  Social Connections: Not on File (04/24/2023)   Received from [facility]   Social Connections    Connectedness: 0  Intimate Partner Violence: Unknown (11/16/2021)   Received from [facility]   HITS    Physically Hurt: Not on file    Insult or Talk Down To: Not on file    Threaten Physical Harm: Not on file    Scream or Curse: Not on file     PHYSICAL EXAM: Vitals:   05/01/24 1148  BP: (!) 142/89  Pulse: 69  SpO2: 96%   General: No acute distress Head:  Normocephalic/atraumatic, no temporal tenderness Skin/Extremities: No rash, no edema Neurological Exam: Mental status: alert and oriented to person, place, and time, no dysarthria or aphasia, Fund of knowledge is appropriate.  Recent memory impaired.  Attention and concentration are normal.    Able to name objects and repeat phrases. MMSE 27/30    05/01/2024   10:00 AM  MMSE - Mini Mental State Exam  Orientation to time 4  Orientation to Place 5  Registration 3  Attention/ Calculation 5  Recall 1  Language- name 2 objects 2  Language- repeat 1  Language- follow 3 step command 3  Language- read & follow direction 1  Write a sentence 1  Copy design 1  Total score 27    Cranial nerves: CN I: not tested CN II: pupils equal, round, visual fields intact CN III, IV, VI:  full range of motion, no nystagmus, no ptosis CN V: facial sensation intact CN VII: upper and lower face symmetric CN VIII: hearing intact to conversation Bulk & Tone: normal, no fasciculations, no cogwheeling. Motor: 5/5 throughout with no pronator drift. Sensation: intact to light touch, cold, pin, vibration sense.  No extinction to double simultaneous stimulation.  Romberg test negative Deep Tendon Reflexes: +2 throughout, negative Hoffman sign Cerebellar: no incoordination on finger to nose, heel to shin. No dysdiadochokinesia Gait: narrow-based and steady, able to tandem walk adequately. Tremor:  none in the office today   IMPRESSION: This is a 63 year old right-handed woman with a history of hypertension, hyperlipidemia, breast cancer s/p lumpectomy and radiation, OSA, and left temporal lobe epilepsy. She reports several concerns today, particularly new onset headaches, dizziness, and memory loss. Her neurological exam is normal, MMSE today 27/30. She denies any seizures since 2023 on Levetiracetam  500mg  BID, refills sent. We discussed concerns today, etiology of new symptoms unclear, MRI brain with and without contrast will be ordered to assess for underlying structural abnormality. We discussed symptomatic treatment of headaches with Gabapentin, which can also help with tremors and sleep. Vestibular therapy may help with the positional symptoms she is having. She would like to hold off and wait for MRI first. She will be scheduled for Neurocognitive testing to further evaluate cognitive concerns. Advised to start B12 supplement. She is aware of [place] driving laws to stop driving after a seizure until 6 months seizure-free. Follow-up in 3 months or earlier if needed, call for any changes.    Thank you for allowing me to participate in the care of this patient. Please do not hesitate to call for any questions or concerns.   Darice Shivers, M.D.  CC: Joesph Porch, PA-C, Annabella Rigg, FNP

## 2024-05-01 NOTE — Patient Instructions (Addendum)
 Good to meet you.  Schedule MRI brain with and without contrast  2. Schedule Neurocognitive testing  3. Our office will call with MRI results. We can consider starting headache medication or Vestibular therapy to help with the clicking with eye movements  4. Start a daily B12 500mcg supplement  5. Refills sent for Keppra  500mg  twice a day  6. Follow-up in 3 months, call for any changes   You have been referred for a neurocognitive evaluation in our office.   The evaluation has two parts.   The first part of the evaluation is a clinical interview with the neuropsychologist (Dr. Richie or Dr. Gayland). Please bring someone with you to this appointment if possible, as it is helpful for the doctor to hear from both you and another adult who knows you well.   The second part of the evaluation is testing with the doctor's technician (Dana or Walls). The testing includes a variety of tasks- mostly question-and-answer, some paper-and-pencil. There is nothing you need to do to prepare for this appointment, but having a good night's sleep prior to the testing, taking medications as you normally would, and bringing eyeglasses and hearing aids (if you wear them), is advised. Please make sure that you wear a mask to the appointment.  Please note: We have to reserve several hours of the neuropsychologist's time and the psychometrician's time for your evaluation appointment. As such, please note that there is a No-Show fee of $100. If you are unable to attend any of your appointments, please contact our office as soon as possible to reschedule.   Seizure Precautions: 1. If medication has been prescribed for you to prevent seizures, take it exactly as directed.  Do not stop taking the medicine without talking to your doctor first, even if you have not had a seizure in a long time.   2. Avoid activities in which a seizure would cause danger to yourself or to others.  Don't operate dangerous machinery,  swim alone, or climb in high or dangerous places, such as on ladders, roofs, or girders.  Do not drive unless your doctor says you may.  3. If you have any warning that you may have a seizure, lay down in a safe place where you can't hurt yourself.    4.  No driving for 6 months from last seizure, as per [place]  state law.   Please refer to the following link on the [employer]ile[employer] of [employer]'s website for more information: http://www.epilepsyfoundation.org/answerplace/Social/driving/drivingu.cfm   5.  Maintain good sleep hygiene. Avoid alcohol.  6.  Contact your doctor if you have any problems that may be related to the medicine you are taking.  7.  Call 911 and bring the patient back to the ED if:        A.  The seizure lasts longer than 5 minutes.       B.  The patient doesn't awaken shortly after the seizure  C.  The patient has new problems such as difficulty seeing, speaking or moving  D.  The patient was injured during the seizure  E.  The patient has a temperature over 102 F (39C)  F.  The patient vomited and now is having trouble breathing

## 2024-05-15 ENCOUNTER — Encounter: Payer: Self-pay | Admitting: Neurology

## 2024-05-25 ENCOUNTER — Ambulatory Visit
Admission: RE | Admit: 2024-05-25 | Discharge: 2024-05-25 | Disposition: A | Discharge status: Home / Self Care | Source: Ambulatory Visit | Attending: Neurology | Admitting: Neurology

## 2024-05-25 DIAGNOSIS — R519 Headache, unspecified: Secondary | ICD-10-CM

## 2024-05-25 DIAGNOSIS — R42 Dizziness and giddiness: Secondary | ICD-10-CM

## 2024-05-25 DIAGNOSIS — R413 Other amnesia: Secondary | ICD-10-CM

## 2024-05-25 DIAGNOSIS — R569 Unspecified convulsions: Secondary | ICD-10-CM

## 2024-05-25 MED ORDER — GADOPICLENOL 0.5 MMOL/ML IV SOLN
6.0000 mL | Freq: Once | INTRAVENOUS | Status: AC | PRN
Start: 2024-05-25 — End: 2024-05-25
  Administered 2024-05-25: 6 mL via INTRAVENOUS

## 2024-05-29 ENCOUNTER — Ambulatory Visit: Payer: Self-pay | Admitting: Neurology

## 2024-05-29 NOTE — Telephone Encounter (Signed)
 Pt called an informed that the brain MRI looks good, it is normal, no tumor, stroke, or bleed. No brain changes to cause headaches or dizziness. We discussed starting Gabapentin to help with headaches and also Vestibular therapy to help with dizziness. Does she want to proceed? Pt state she has medication for her headaches that there is no need for the vestibular therapy.

## 2024-06-17 ENCOUNTER — Other Ambulatory Visit: Payer: Self-pay | Admitting: Physician Assistant

## 2024-06-17 DIAGNOSIS — Z122 Encounter for screening for malignant neoplasm of respiratory organs: Secondary | ICD-10-CM

## 2024-06-17 DIAGNOSIS — Z720 Tobacco use: Secondary | ICD-10-CM

## 2024-06-21 ENCOUNTER — Ambulatory Visit: Payer: Self-pay

## 2024-06-21 ENCOUNTER — Ambulatory Visit (INDEPENDENT_AMBULATORY_CARE_PROVIDER_SITE_OTHER): Admitting: Psychology

## 2024-06-21 DIAGNOSIS — F4321 Adjustment disorder with depressed mood: Secondary | ICD-10-CM | POA: Diagnosis not present

## 2024-06-21 DIAGNOSIS — R4189 Other symptoms and signs involving cognitive functions and awareness: Secondary | ICD-10-CM

## 2024-06-21 NOTE — Progress Notes (Signed)
   Psychometrician Note   Cognitive testing was administered to Eileen Holland by Evalene Pizza, B.S. (psychometrist) under the supervision of Dr. Renda Beckwith, Psy.D., licensed psychologist on 06/21/2024. Ms. Washburn did not appear overtly distressed by the testing session per behavioral observation or responses across self-report questionnaires. Rest breaks were offered.   The battery of tests administered was selected by Dr. Renda Beckwith, Psy.D. with consideration to Ms. Selway's current level of functioning, the nature of her symptoms, emotional and behavioral responses during interview, level of literacy, observed level of motivation/effort, and the nature of the referral question. This battery was communicated to the psychometrist. Communication between Dr. Renda Beckwith, Psy.D. and the psychometrist was ongoing throughout the evaluation and Dr. Renda Beckwith, Psy.D. was immediately accessible at all times. Dr. Renda Beckwith, Psy.D. provided supervision to the psychometrist on the date of this service to the extent necessary to assure the quality of all services provided.    Evonna Ebner will return within approximately 1-2 weeks for an interactive feedback session with Dr. Beckwith at which time her test performances, clinical impressions, and treatment recommendations will be reviewed in detail. Ms. Arkin understands she can contact our office should she require our assistance before this time.  A total of 190 minutes of billable time were spent face-to-face with Ms. Kanitz by the psychometrist. This includes both test administration and scoring time. Billing for these services is reflected in the clinical report generated by Dr. Renda Beckwith, Psy.D.  This note reflects time spent with the psychometrician and does not include test scores or any clinical interpretations made by Dr. Beckwith. The full report will follow in a separate note.

## 2024-06-21 NOTE — Progress Notes (Signed)
 NEUROPSYCHOLOGICAL EVALUATION [place]  [place] Department of Neurology  Date of Evaluation: 06/21/2024  REASON FOR REFERRAL   Eileen Holland is a 63 year old, right-handed, Black female with 12 years of formal education. She was referred for neuropsychological evaluation by her neurologist, Darice Shivers, M.D., to assess current neurocognitive functioning, document potential cognitive deficits, and assist with treatment planning. This is her first neuropsychological evaluation.  SUMMARY OF RESULTS   Premorbid cognitive abilities are estimated to be in the average range based on word reading and sociodemographic factors. Relative to this baseline estimate, current performance was variable across domains of executive functioning, language, visuospatial abilities, and learning/memory. She otherwise demonstrated consistently intact working memory and processing speed, with fine motor dexterity preserved and without lateralizing signs.   Regarding executive functioning, she demonstrated difficulty on a novel problem-solving task, producing an elevated number of non-perseverative errors, losing track of her sorting strategy twice, and identifying only one solution. All other executive measures, including alternating attention, abstract reasoning, and phonemic fluency, were intact.   Language performance was largely within expectations, with both phonemic and semantic fluency intact; however, confrontation naming was below expectations, though she benefited from semantic and phonemic cues.   Regarding visuospatial abilities, she showed some difficulty copying a complex figure, with a disorganized approach that led to misplacement and distortion of details. She otherwise performed within expectations when constructing block designs, judging line orientations, and copying simple figures.  Learning/memory were similarly variable. She demonstrated difficulty encoding, recalling, and  recognizing words from a list. However, she retained the majority of the words she learned, and her low recognition scores appeared to reflect her limited initial learning. For the complex figure, she immediately recalled less than half of the details she initially copied, with even fewer details recalled after a long delay. Given that her copy of the figure was initially poor, though, it is reasonable to consider that this may have contributed to some of her difficulty truly encoding the material. Recognition of the figure's details remained within expectations. By contrast, she performed well when encoding, recalling, and recognizing of short stories and simple shapes  On self-report questionnaires, she endorsed severe symptoms of depression and anxiety.  DIAGNOSTIC IMPRESSION   Results of the current evaluation indicated an overall pattern of cognitive strengths and weaknesses that did not strongly localize to a specific brain region or lateralize to one hemisphere. Deficits were subtle and scattered across problem-solving, figure copying, naming, and learning/memory tasks, with many other areas preserved. If anything, this pattern reflects mild inefficiencies in executive functioning. These inefficiencies cannot be definitively ruled out as related to seizure activity. However, she has not had a documented seizure since 2023, and structural imaging shows no abnormalities that would suggest seizure-related changes. Other possible contributing factors to the overall clinical picture include mood disturbance, chronic headaches, intermittent sleep issues, and mild cerebrovascular changes. Importantly, she is also in a stage of life transition and appears to be experiencing difficulty adjusting to being unemployed and having little structured activity during the day.  ICD-10 Codes: F43.21 Adjustment disorder with depressed mood; R41.89 Cognitive changes  RECOMMENDATIONS   In consultation with your doctor,  schedule cognitive reevaluation on an as-needed basis to assess for cognitive decline and update treatment recommendations. Reevaluation should occur during a period of medical and affective stability.  It is understandable that finding employment at this stage can feel challenging, and your efforts so far are commendable. Focusing on your transferable skills and the strengths you  bring from past experiences can help highlight your value to potential employers. Attending job fairs or connecting with community and professional networks may provide additional opportunities and support your search. While continuing to pursue employment is important, engaging in structured activities such as part-time work, volunteering, or other meaningful daily activities can be very helpful in the meantime. These experiences support routine, social interaction, and cognitive stimulation, promoting mental health and overall functioning, while potentially opening doors to future employment.  Continue mental treatment, especially given that emotional distress can exacerbate cognitive difficulties. Discuss current medication regimen with your prescribing provider to ensure you are receiving maximum benefit. If symptoms begin to interfere with daily functioning, you may wish to reconsider exploring additional treatment options, such as mindfulness, relaxation techniques, or counseling.  Prioritize physical health through diet, exercise, and sleep. Regular physical activity supports cardiovascular health, improves mood, and helps preserve mobility and independence. Aim for at least 150 minutes of moderate aerobic exercise per week (e.g., brisk walking, swimming, gardening). A brain-healthy diet such as the Mediterranean or MIND diet is rich in fruits, vegetables, whole grains, healthy fats, and lean proteins, and has been associated with reduced risk of cognitive decline. Additionally, getting adequate, quality sleep and managing  chronic conditions with the help of healthcare providers are essential components of healthy aging.  Continue to stay socially and mentally engaged. Maintaining strong social connections and regularly stimulating your brain can help protect against cognitive decline. This includes staying connected with friends and family, volunteering, or participating in community groups. Mentally engaging activities--such as reading, doing puzzles, playing strategy games, or learning a new language or musical instrument--promote brain plasticity. If you are interested in activities to support cognitive engagement, this site offers a variety of apps and games organized by difficulty level:  https://www.[facility].org/get-to-know-barrow/centers-programs/neurorehabilitation-center/neuro-rehab-apps-and-games/  Consider implementing compensatory strategies to maximize independence and maintain daily functioning. Examples include:  Adhere to routine. Compensatory strategies work best when they are used consistently. Use a planner, calendar, or white board that has the schedule and important events for the day clearly listed to reference and cross off when tasks are complete.  Ask for written information, especially if it is new or unfamiliar (e.g., information provided at a doctor's appointment).  Create an organized environment. Keep items that can be easily misplaced in a sensible location and get into the habit of always returning the items to those places. Pay attention and reduce distractions. Make a point of focusing attention on information you want to remember. One-on-one interaction is more likely to facilitate attention and minimize distraction. Make eye contact and repeat the information out loud after you hear it. Reduce interruptions or distractions especially when attempting to learn new information.  Create associations. When learning something new, think about and understand the information. Explain it in  your own words or try to associate it with something you already know. Take notes to help remember important details. Evaluate goals and plan accordingly. When confronted by many different tasks, begin by making a list that prioritizes each task and estimates the time it will take to complete. Break down complicated tasks into smaller, more manageable steps. Focus on one task at a time and complete each task before starting another. Avoid multitasking.  DISPOSITION   Patient will follow up with the referring provider, Dr. Georjean. No follow-up neuropsychological testing was scheduled at this time. Please feel free to refer the patient for repeated evaluation if she shows a significant change in neurocognitive status. She will be provided  verbal feedback in approximately one week regarding the findings and impression during this visit.  The remainder of the report includes the details of the patient's background and a table of results from the current evaluation, which support the summary and recommendations described above.  BACKGROUND   History of Presenting Illness: The following information was obtained from a review of medical records and an interview with the patient. Briefly, the patient established care with Dr. Georjean at [facility] on 05/01/2024 for evaluation of seizures, migraines, and intermittent bilateral hand tremors. Her first seizure occurred in March 2019, and she has reportedly experienced only two nocturnal seizures since, with the most recent episode occurring in 2023. Please refer to neurology notes for a detailed seizure history. Approximately one year ago, the patient began noticing short-term memory changes. Despite these concerns, she remains relatively independent in her daily activities. MMSE = 27/30. She was referred for a neuropsychological evaluation for further assessment.  Cognitive Functioning: During today's appointment, the patient reported cognitive changes  over the past year. She specifically described a sense of "blanking out" with recent events, feeling as though she was not fully present or that the events did not truly occur. She denied major difficulties recalling conversations but occasionally misplaces items or struggles to recall an event, sometimes requiring hours for the memory to return. She endorsed word-finding difficulties and slower processing speed. When there are no distractions, she is generally able to maintain concentration, but otherwise, she can lose focus. She does not report problems with executive functioning, such as planning or organizing, provided that tasks are written down. She also denied any navigational difficulties.  Physical Functioning: Patient endorsed occasional difficulty with sleep maintenance, usually related to situational factors, such as her stepson waking her and her family during the night. She reported a longstanding history of insomnia, which has resolved over the past several years. She has sleep apnea and consistently uses her CPAP machine nightly. Appetite is stable. She noted a diminished sense of smell, though her sense of taste remains intact. Vision is generally adequate, but she is overdue for an eye exam and feels she may need a stronger prescription for her glasses. She does not report significant hearing loss, although her daughter has suggested she may have some degree of reduced hearing. Patient experiences vertigo and intermittent balance difficulties but denies any history of falls. She also intermittently experiences hand tremors, typically worse in the morning and improving throughout the day. Additionally, she has chronic headaches.  Emotional Functioning: Patient reported feeling stressed recently, which she attributes to difficulty finding employment and her current dependence on others for housing and support due to lack of income. She described her daily activities as limited, noting that she  spends much of her time cleaning around the house or sitting on the couch using social media. She denied any suicidal ideation.  Neuroimaging: MRI of the brain (05/25/2024) documented mild chronic small vessel disease. Hippocampi symmetric in size and signal. No gray matter heterotopia or cortical dysplasia. Unremarkable sella.  Other Relevant Medical History: Remarkable for hypertension, hyperlipidemia, sleep apnea, and history of breast cancer. Please refer to the medical record for a more comprehensive problem list and a detailed description of the patient's seizure and migraine history. There is no reported history of stroke, CNS infection, or head injury. Patient did mention possibly being told at one time that she had a "mini stroke," though she was uncertain about the details surrounding this event.  Current Medications: Per record, acetaminophen -butalbital,  amlodipine , B complex vitamins, cetirizine, clonidine, hydrochlorothiazide , levetiracetam , losartan , metoprolol , paroxetine , potassium chloride , pravastatin, and prenatal vitamins.   Functional Status: Patient continues to drive without any reported accidents, traffic violations, or navigational difficulties. She manages her medications independently without reported issues. Her daughter currently manages the finances, primarily because the patient has moved in with her daughter and her family and does not have a personal income at this time. Patient remains independent in all basic activities of daily living.  Family Neurological History: Remarkable for dementia in the patient's mother and maternal aunt.  Psychiatric History: Remarkable for situational depression, currently managed with medication. History of anxiety, counseling, suicidal ideation, hallucinations, and psychiatric hospitalizations was not reported.  Substance Use History: Patient smokes approximately ten cigarettes per day, noting this as a reduction in use, as she intends  to quit. She denied current use of alcohol, marijuana, and other illicit substances.  Social and Developmental History: Patient was born in [place], [place]. History of perinatal complications and developmental delays was not reported. She is single and has one daughter. She currently resides with her daughter, her daughter's partner, and their three children.   Educational and Occupational History: No history of childhood learning disability, special education services, or grade retention was reported. 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 She graduated high school one year early. 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 Since then, she has struggled to find new employment but remains interested in returning to work.  BEHAVIORAL OBSERVATIONS   Patient arrived on time and was unaccompanied. She ambulated independently and without gait disturbance. She was alert and fully oriented. She was appropriately groomed and dressed for the setting. No significant sensory or motor abnormalities were observed. Vision (with glasses) and hearing were adequate for testing purposes. Speech was of normal rate, prosody, and volume. No conversational word-finding difficulties, paraphasic errors, or dysarthria were observed. Comprehension was conversationally intact. Thought processes were linear, logical, and coherent. Thought content was organized and devoid of delusions. Insight appeared appropriate. Affect was even and congruent with mood. She was cooperative and appeared to give adequate effort during testing, including on standalone and embedded measures of performance validity. Results are thought to accurately reflect her cognitive functioning at this time.  NEUROPSYCHOLOGICAL TESTING RESULTS   Tests Administered: Animal Naming Test; Brief Visuospatial Memory Test-Revised (BVMT-R) - Form 1; Controlled Oral Word  Association Test (COWAT): FAS; Geriatric Anxiety Scale-10 Item (GAS-10); Geriatric Depression Scale Short Form (GDS-SF); Grooved Pegboard Test; [employer] Learning Test-Revised (HVLT-R) - From 1; Judgment of Line Orientation (JLO) - Form V; Neuropsychological Assessment Battery (NAB) - Subtest(s): Naming Form 1; Rey Complex Figure Test (RCFT); Standalone performance validity test (PVT); Test of Premorbid Functioning (TOPF); Trail Making Test (TMT); Wechsler Adult Intelligence Scale Fifth Edition (WAIS-5) - Subtest(s): Similarities, Clinical Cytogeneticist, Matrix Reasoning, Digit Sequencing, Coding, Running Digits, Symbol Search, Symbol Span; Wechsler Memory Scale Fourth Edition (WMS-IV) - Subtest(s): Logical Memory (LM); and [place]  Card Sorting Test 64 Card Version (WCST-64).  Test results are provided in the table below. Whenever possible, the patient's scores were compared against age-, sex-, and education-corrected normative samples. Interpretive descriptions are based on the AACN consensus conference statement on uniform labeling (Guilmette et al., 2020).  PREMORBID FUNCTIONING RAW  RANGE  TOPF 36 StdS=95 Average  ATTENTION & WORKING MEMORY RAW  RANGE  WAIS-5 Digit Sequencing -- ss=10 Average  WAIS-5 Running Digits -- ss=10 Average  WAIS-5 Symbol Span -- ss=10 Average  PROCESSING SPEED RAW  RANGE  Trails A 32''0e T=56 Average  WAIS-5 Coding  -- ss=10 Average  WAIS-5 Symbol Search -- ss=9 Average  EXECUTIVE FUNCTION RAW  RANGE  Trails B 62''0e T=66 Above Average  WAIS-5 Matrix Reasoning -- ss=8 Average  WAIS-5 Similarities -- ss=6 Low Average  COWAT Letter Fluency 15+9+17 T=57 High Average  WCST-64 Total Errors 26 T=40 Low Average  WCST-64 Perseverative Errors 9 T=50 Average  WCST-64 Nonperseverative Errors 17 T=33 Below Average  WCST-64 Categories Completed 1 6-10%ile Below Average to Low Average  WCSR-64 FMS 2 -- --  LANGUAGE RAW  RANGE  COWAT Letter Fluency 15+9+17 T=57 High Average   Animal Naming Test 19 T=59 High Average  NAB Naming Test 25/31 +2 w/[place] +2 w/PC T=29 BNL  VISUOSPATIAL RAW  RANGE  WAIS-5 Block Design -- ss=6 Low Average  JLO C/S=22/30 22%ile Low Average  RCFT Copy 25/36 <1%ile Exceptionally Low  BVMT-R Copy Trial 10/12 -- WNL  VERBAL LEARNING & MEMORY RAW  RANGE  HVLT-R Learning Trials (6+6+9)/36 T=34 Below Average  HVLT-R Delayed Recall 6/12 T=30 Below Average  HVLT-R Recognition Hits 10 -- --  HVLT-R Recognition False Positives 1 -- --  HVLT-R Discrimination Index 9 T=36 Below Average  WMS-IV LM-I  (14+13)/50 ss=11 Average  WMS-IV LM-II  (11+12)/50 ss=11 Average  WMS-IV LM Recognition  (13+13)/30 >75%ile High Average to Exceptionally High  VISUAL LEARNING & MEMORY RAW  RANGE  RCFT Copy 25/36 <1%ile Exceptionally Low  RCFT Immediate 12/36 T=40 Low Average  RCFT Delayed 8/36 T=30 Below Average  RCFT Recognition Total 21/24 T=53 Average  BVMT-R Total Recall (5+9+10)/36 T=53 Average  BVMT-R Delayed Recall 10/12 T=57 High Average  BVMT-R Recognition Hits 6 >16%ile WNL  BVMT-R Recognition False Alarms 0 >16%ile WNL  BVMT-R Recognition Discrimination Index 6 >16%ile WNL  FINE MOTOR DEXTERITY RAW  RANGE  Grooved Pegboard (Dominant Hand) 108''0d T=41 Low Average  Grooved Pegboard (Non-Dominant Hand) 99''3d T=52 Average  QUESTIONNAIRES RAW  RANGE  GDS-SF 12 -- Severe  GAS-10 15 -- Severe  *Note: ss = scaled score; StdS = standard score; T = t-score; C/S = corrected raw score; WNL = within normal limits; BNL= below normal limits; D/C = discontinued. Scores from skewed distributions are typically interpreted as WNL (>=16th %ile) or BNL (<16th %ile).   INFORMED CONSENT   Patient was provided with a verbal description of the nature and purpose of the neuropsychological evaluation. Also reviewed were the foreseeable risks and/or discomforts and benefits of the procedure, limits of confidentiality, and mandatory reporting requirements of this provider.  Patient was given the opportunity to have their questions answered. Oral consent to participate was provided by the patient.   This report was prepared as part of a clinical evaluation and is not intended for forensic use.  SERVICE   This evaluation was conducted by Renda Beckwith, Psy.D. In addition to time spent directly with the patient, total professional time (180 minutes) includes record review, integration of relevant medical history, test selection, interpretation of findings, and report preparation. A technician, Evalene Pizza, B.S., provided testing and scoring assistance (190 minutes).  Psychiatric Diagnostic Evaluation Services (Professional): [id number] x 1 Neuropsychological Testing Evaluation Services (Professional): [phone] x 1 Neuropsychological Testing Evaluation Services (Professional): [id number] x 2 Neuropsychological Test Administration and Scoring (Technician): [phone] x 1 Neuropsychological Test Administration and Scoring (Technician): [phone] x 5  This report was generated using voice recognition software. While this document has been carefully  reviewed, transcription errors may be present. I apologize in advance for any inconvenience. Please contact me if further clarification is needed.            Renda Beckwith, Psy.D.             Neuropsychologist

## 2024-06-24 NOTE — Progress Notes (Unsigned)
 Cardiology Office Note   Date:  06/26/2024  ID:  Eileen Holland, DOB Mar 15, 1961, MRN [id number] PCP: Eileen Search, PA-C  [facility] Providers Cardiologist:  Eileen Eileen Lawrence, MD     History of Present Illness Eileen Holland is a 63 y.o. female with PMH of hypertension, tobacco abuse and OSA on CPAP.  She also has significant family history of hypertension.  She has been seen by cardiology service for resistant hypertension.  She was last seen by Dr. Lawrence on 03/22/2024 at which time her blood pressure was in the 130s.  Secondary hypertension workup including TSH and renal artery ultrasound both normal.  Aldosterone/renin ratio was quite high at 73.1.  There was high suspicion for primary hyperaldosteronism, she was referred to hypertension clinic with anticipation of starting on spironolactone and weaning off some of the other blood pressure medications.  Echocardiogram obtained on 04/10/2024 showed EF 65 to 70%, no regional wall motion abnormality, normal RV, trivial MR, mild AI.  Since the last visit, patient has been seen by Dr. Georjean of neurology service for new onset of headache.  MRI of the brain obtained on 05/25/2024 showed no acute intracranial abnormality or mass.  There was some consideration to start gabapentin to help with that headaches and also vestibular therapy to help with the dizziness.  Patient presents today for follow-up.  She denies any chest pain or shortness of breath.  We reviewed the recent blood work and echocardiogram.  I will stop her clonidine and potassium supplement and start her on spironolactone 50 mg daily.  If systolic blood pressures greater than 130 mmHg after 3 days, she has been instructed to increase spironolactone to 100 mg daily.  Our plan is to hopefully consolidateto a number of her blood pressure medications in the future.  She would require a basic metabolic panel in 1 week.  We have pushed her previously scheduled appointment with hypertension  clinic in 2 days to a week for now to review the result of medication adjustment and to discuss possibility of salt loading.  ROS:   She denies chest pain, palpitations, dyspnea, pnd, orthopnea, n, v, dizziness, syncope, edema, weight gain, or early satiety. All other systems reviewed and are otherwise negative except as noted above.    Studies Reviewed      Cardiac Studies & Procedures   ______________________________________________________________________________________________     ECHOCARDIOGRAM  ECHOCARDIOGRAM COMPLETE 04/10/2024  Narrative ECHOCARDIOGRAM REPORT    Patient Name:   JINI Holland     Date of Exam: 04/10/2024 Medical Rec #:  [id number]       Height:       62.5 in Accession #:    [id number]      Weight:       143.6 lb Date of Birth:  Aug 03, 1961       BSA:          1.671 m Patient Age:    62 years        BP:           135/77 mmHg Patient Gender: F               HR:           51 bpm. Exam Location:  [address]  Procedure: 2D Echo, Cardiac Doppler and Color Doppler (Both Spectral and Color Flow Doppler were utilized during procedure).  Indications:    Dilated aortic root, hypertension  History:        Patient has no prior history  of Echocardiogram examinations. Risk Factors:Hypertension and Dyslipidemia.  Sonographer:    Eileen Holland MHA, RDMS, RVT, RDCS Referring Phys: [id number] [facility] J PATWARDHAN  IMPRESSIONS   1. Left ventricular ejection fraction, by estimation, is 65 to 70%. The left ventricle has normal function. The left ventricle has no regional wall motion abnormalities. Left ventricular diastolic parameters were normal. The average left ventricular global longitudinal strain is -21.0 %. The global longitudinal strain is normal. 2. Right ventricular systolic function is normal. The right ventricular size is normal. 3. The mitral valve is grossly normal. Trivial mitral valve regurgitation. No evidence of mitral stenosis. 4. The aortic  valve was not well visualized. Aortic valve regurgitation is mild. 5. The inferior vena cava is normal in size with greater than 50% respiratory variability, suggesting right atrial pressure of 3 mmHg.  Comparison(s): Changes from prior study are noted. Mild aortic regurgitation now present. Aortic size measures within normal range on current study.  Conclusion(s)/Recommendation(s): Otherwise normal echocardiogram, with minor abnormalities described in the report.  FINDINGS Left Ventricle: Left ventricular ejection fraction, by estimation, is 65 to 70%. The left ventricle has normal function. The left ventricle has no regional wall motion abnormalities. The average left ventricular global longitudinal strain is -21.0 %. Strain was performed and the global longitudinal strain is normal. The left ventricular internal cavity size was normal in size. There is borderline left ventricular hypertrophy. Left ventricular diastolic parameters were normal.  Right Ventricle: The right ventricular size is normal. No increase in right ventricular wall thickness. Right ventricular systolic function is normal.  Left Atrium: Left atrial size was normal in size.  Right Atrium: Right atrial size was normal in size.  Pericardium: There is no evidence of pericardial effusion.  Mitral Valve: The mitral valve is grossly normal. Trivial mitral valve regurgitation. No evidence of mitral valve stenosis.  Tricuspid Valve: The tricuspid valve is normal in structure. Tricuspid valve regurgitation is trivial. No evidence of tricuspid stenosis.  Aortic Valve: The aortic valve was not well visualized. Aortic valve regurgitation is mild. Aortic regurgitation PHT measures 457 msec. Aortic valve mean gradient measures 3.0 mmHg. Aortic valve peak gradient measures 5.3 mmHg. Aortic valve area, by VTI measures 1.54 cm.  Pulmonic Valve: The pulmonic valve was not well visualized. Pulmonic valve regurgitation is trivial. No  evidence of pulmonic stenosis.  Aorta: The aortic root, ascending aorta, aortic arch and descending aorta are all structurally normal, with no evidence of dilitation or obstruction.  Venous: The inferior vena cava is normal in size with greater than 50% respiratory variability, suggesting right atrial pressure of 3 mmHg.  IAS/Shunts: The atrial septum is grossly normal.   LEFT VENTRICLE PLAX 2D LVIDd:         3.67 cm   Diastology LVIDs:         2.39 cm   LV e' medial:    8.92 cm/s LV PW:         1.22 cm   LV E/e' medial:  8.5 LV IVS:        1.08 cm   LV e' lateral:   9.46 cm/s LVOT diam:     1.47 cm   LV E/e' lateral: 8.0 LV SV:         44 LV SV Index:   27        2D Longitudinal Strain LVOT Area:     1.70 cm  2D Strain GLS (A4C):   -18.7 % 2D Strain GLS (A3C):   23.1 %  2D Strain GLS (A2C):   -21.1 % 2D Strain GLS Avg:     -21.0 %  RIGHT VENTRICLE             IVC RV Basal diam:  3.28 cm     IVC diam: 0.85 cm RV Mid diam:    2.71 cm RV S prime:     11.20 cm/s TAPSE (M-mode): 2.5 cm  LEFT ATRIUM             Index        RIGHT ATRIUM           Index LA diam:        3.16 cm 1.89 cm/m   RA Area:     16.00 cm LA Vol (A2C):   39.7 ml 23.76 ml/m  RA Volume:   39.50 ml  23.64 ml/m LA Vol (A4C):   37.7 ml 22.57 ml/m LA Biplane Vol: 40.6 ml 24.30 ml/m AORTIC VALVE AV Area (Vmax):    1.53 cm AV Area (Vmean):   1.49 cm AV Area (VTI):     1.54 cm AV Vmax:           115.00 cm/s AV Vmean:          81.400 cm/s AV VTI:            0.288 m AV Peak Grad:      5.3 mmHg AV Mean Grad:      3.0 mmHg LVOT Vmax:         104.00 cm/s LVOT Vmean:        71.300 cm/s LVOT VTI:          0.262 m LVOT/AV VTI ratio: 0.91 AI PHT:            457 msec  AORTA Ao Root diam: 2.83 cm Ao Asc diam:  3.57 cm  MITRAL VALVE MV Area (PHT): 4.31 cm    SHUNTS MV Decel Time: 176 msec    Systemic VTI:  0.26 m MV E velocity: 75.50 cm/s  Systemic Diam: 1.47 cm MV A velocity: 76.50 cm/s MV E/A ratio:   0.99  Shelda Bruckner MD Electronically signed by Shelda Bruckner MD Signature Date/Time: 04/10/2024/3:15:24 PM    Final          ______________________________________________________________________________________________      Risk Assessment/Calculations           Physical Exam VS:  BP 124/82   Pulse (!) 55   Ht 5' 2.5 (1.588 m)   Wt 139 lb 3.2 oz (63.1 kg)   LMP 03/14/2011   SpO2 90%   BMI 25.05 kg/m        Wt Readings from Last 3 Encounters:  06/25/24 139 lb 3.2 oz (63.1 kg)  05/01/24 143 lb (64.9 kg)  03/22/24 143 lb 9.6 oz (65.1 kg)    GEN: Well nourished, well developed in no acute distress NECK: No JVD; No carotid bruits CARDIAC: RRR, no murmurs, rubs, gallops RESPIRATORY:  Clear to auscultation without rales, wheezing or rhonchi  ABDOMEN: Soft, non-tender, non-distended EXTREMITIES:  No edema; No deformity   ASSESSMENT AND PLAN  Resistant hypertension: Patient is currently on 5 different blood pressure medications.  Recent blood work was concerning for hyperaldosteronism.  Current plan is to start her on spironolactone and hopefully reduce the number of antihypertensive medications patient will require.  Will start spironolactone 50 mg daily with the intention to uptitrate spironolactone to 100 mg daily after 3 days if systolic blood pressure is  still greater than 140 mmHg.  She has been instructed to stop clonidine and potassium supplement.  She has upcoming follow-up with hypertension clinic        Dispo: Follow-up with hypertension clinic.  Signed, Scot Ford, PA

## 2024-06-25 ENCOUNTER — Ambulatory Visit: Attending: Physician Assistant | Admitting: Physician Assistant

## 2024-06-25 ENCOUNTER — Encounter: Payer: Self-pay | Admitting: Physician Assistant

## 2024-06-25 VITALS — BP 124/82 | HR 55 | Ht 62.5 in | Wt 139.2 lb

## 2024-06-25 DIAGNOSIS — I10 Essential (primary) hypertension: Secondary | ICD-10-CM

## 2024-06-25 DIAGNOSIS — I1A0 Resistant hypertension: Secondary | ICD-10-CM | POA: Insufficient documentation

## 2024-06-25 MED ORDER — SPIRONOLACTONE 50 MG PO TABS: 50.0000 mg | ORAL_TABLET | Freq: Every day | ORAL | 3 refills | Status: DC

## 2024-06-25 NOTE — Patient Instructions (Signed)
 Medication Instructions:  Your physician has recommended you make the following change in your medication:  STOP CLONIDINE STOP POTASSIUM  START SPIRONOLACTONE 50 MG DAILY. IF SYSTOLIC  BLOOD PRESSURE (TOP NUMBER) IS GREATER THAN 130 IN 3 DAYS INCREASE SPIRONOLACTONE TO 100 MG DAILY AND LET THE OFFICE KNOW [phone] *If you need a refill on your cardiac medications before your next appointment, please call your pharmacy*  Lab Work: TO BE DONE ON 07/04/24: BMET If you have labs (blood work) drawn today and your tests are completely normal, you will receive your results only by: MyChart Message (if you have MyChart) OR A paper copy in the mail If you have any lab test that is abnormal or we need to change your treatment, we will call you to review the results.  Testing/Procedures: NONE  Follow-Up: At [facility], you and your health needs are our priority.  As part of our continuing mission to provide you with exceptional heart care, our providers are all part of one team.  This team includes your primary Cardiologist (physician) and Advanced Practice Providers or APPs (Physician Assistants and Nurse Practitioners) who all work together to provide you with the care you need, when you need it.  Your next appointment:   KEEP SCHEDULED FOLLOW-UP  We recommend signing up for the patient portal called MyChart.  Sign up information is provided on this After Visit Summary.  MyChart is used to connect with patients for Virtual Visits (Telemedicine).  Patients are able to view lab/test results, encounter notes, upcoming appointments, etc.  Non-urgent messages can be sent to your provider as well.   To learn more about what you can do with MyChart, go to [email or web address].   Other Instructions

## 2024-06-27 ENCOUNTER — Institutional Professional Consult (permissible substitution) (HOSPITAL_BASED_OUTPATIENT_CLINIC_OR_DEPARTMENT_OTHER): Admitting: Family

## 2024-07-03 ENCOUNTER — Ambulatory Visit
Admission: RE | Admit: 2024-07-03 | Discharge: 2024-07-03 | Disposition: A | Discharge status: Home / Self Care | Source: Ambulatory Visit | Attending: Physician Assistant | Admitting: Physician Assistant

## 2024-07-03 DIAGNOSIS — Z122 Encounter for screening for malignant neoplasm of respiratory organs: Secondary | ICD-10-CM

## 2024-07-03 DIAGNOSIS — Z720 Tobacco use: Secondary | ICD-10-CM

## 2024-07-04 ENCOUNTER — Ambulatory Visit (INDEPENDENT_AMBULATORY_CARE_PROVIDER_SITE_OTHER): Admitting: Psychology

## 2024-07-04 ENCOUNTER — Other Ambulatory Visit: Payer: Self-pay

## 2024-07-04 DIAGNOSIS — R4189 Other symptoms and signs involving cognitive functions and awareness: Secondary | ICD-10-CM | POA: Diagnosis not present

## 2024-07-04 DIAGNOSIS — I1A0 Resistant hypertension: Secondary | ICD-10-CM

## 2024-07-04 DIAGNOSIS — F4321 Adjustment disorder with depressed mood: Secondary | ICD-10-CM

## 2024-07-04 NOTE — Progress Notes (Signed)
   NEUROPSYCHOLOGY FEEDBACK SESSION [facility]  [place] Department of Neurology  Date of Feedback Session: 07/04/2024  REASON FOR REFERRAL   Eileen Holland is a 63 year old, right-handed, Black female with 12 years of formal education. She was referred for neuropsychological evaluation by her neurologist, Darice Shivers, M.D., to assess current neurocognitive functioning, document potential cognitive deficits, and assist with treatment planning. This is her first neuropsychological evaluation.  FEEDBACK   Patient completed a comprehensive neuropsychological evaluation on 06/21/2024. Please refer to that encounter for the full report and recommendations. Briefly, results indicated an overall pattern of cognitive strengths and weaknesses that did not strongly localize to a specific brain region or lateralize to one hemisphere. Deficits were subtle and scattered across problem-solving, figure copying, naming, and learning/memory tasks, with many other areas preserved. If anything, this pattern reflects mild inefficiencies in executive functioning. These inefficiencies cannot be definitively ruled out as related to seizure activity. However, she has not had a documented seizure since 2023, and structural imaging shows no abnormalities that would suggest seizure-related changes. Other possible contributing factors to the overall clinical picture include mood disturbance, chronic headaches, intermittent sleep issues, and mild cerebrovascular changes. Importantly, she is also in a stage of life transition and appears to be experiencing difficulty adjusting to being unemployed and having little structured activity during the day.   Today, the patient was unaccompanied. She was provided verbal feedback regarding the findings and impression during this visit, and her questions were answered. A copy of the report was provided at the conclusion of the visit.  DISPOSITION   Patient will follow  up with the referring provider, Dr. Shivers. No follow-up neuropsychological testing was scheduled at this time. Please feel free to refer the patient for repeated evaluation if she shows a significant change in neurocognitive status.  SERVICE   This feedback session was conducted by Renda Beckwith, Psy.D. [place] [id number] (35 minutes) was billed for Dr. Beckwith' time spent in preparing, conducting, and documenting the current feedback session.  This report was generated using voice recognition software. While this document has been carefully reviewed, transcription errors may be present. I apologize in advance for any inconvenience. Please contact me if further clarification is needed.

## 2024-07-05 ENCOUNTER — Ambulatory Visit: Payer: Self-pay | Admitting: Physician Assistant

## 2024-07-05 ENCOUNTER — Encounter (HOSPITAL_BASED_OUTPATIENT_CLINIC_OR_DEPARTMENT_OTHER): Payer: Self-pay | Admitting: Family

## 2024-07-05 ENCOUNTER — Ambulatory Visit (INDEPENDENT_AMBULATORY_CARE_PROVIDER_SITE_OTHER): Admitting: Family

## 2024-07-05 VITALS — BP 131/85 | HR 78 | Ht 62.5 in | Wt 137.8 lb

## 2024-07-05 DIAGNOSIS — I10 Essential (primary) hypertension: Secondary | ICD-10-CM | POA: Diagnosis not present

## 2024-07-05 DIAGNOSIS — G4733 Obstructive sleep apnea (adult) (pediatric): Secondary | ICD-10-CM

## 2024-07-05 LAB — BASIC METABOLIC PANEL WITH GFR
BUN/Creatinine Ratio: 20 (ref 12–28)
BUN: 18 mg/dL (ref 8–27)
CO2: 25 mmol/L (ref 20–29)
Calcium: 9.6 mg/dL (ref 8.7–10.3)
Chloride: 104 mmol/L (ref 96–106)
Creatinine, Ser: 0.88 mg/dL (ref 0.57–1.00)
Glucose: 116 mg/dL — ABNORMAL HIGH (ref 70–99)
Potassium: 3.5 mmol/L (ref 3.5–5.2)
Sodium: 143 mmol/L (ref 134–144)
eGFR: 74 mL/min/1.73 (ref >59)

## 2024-07-05 MED ORDER — OLMESARTAN-AMLODIPINE-HCTZ 40-10-25 MG PO TABS: 1.0000 | ORAL_TABLET | Freq: Every day | ORAL | 5 refills | Status: DC

## 2024-07-05 MED ORDER — COMFORT TOUCH BP CUFF/MEDIUM MISC
1.0000 [IU] | Freq: Once | 0 refills | Status: AC
Start: 2024-07-05 — End: 2024-07-05

## 2024-07-05 NOTE — Progress Notes (Unsigned)
 [facility] Initial Assessment:    Date:  07/06/2024   ID:  Waynetta Metheny, DOB 09-04-1960, MRN [id number]  PCP:  Buck Search, PA-C  Cardiologist:  Newman [name], MD  Nephrologist:  Referring MD: Buck Search, PA-C   CC: Hypertension  History of Present Illness:    Eileen Holland is a 63 y.o. female with a hx of HTN, tobacco use, OSA on CPAP here to establish care in the [facility].   CT 2023 with normal R adrenal, mild L adrenal thickening. Labs 8/825 with normal TSH. Renin < 0.167 and aldosterone 12.2 indeterminate for hyperaldosteronism.  Renal duplex 04/10/24 with no renal artery stenosis. Echo 04/10/24 LVEF 65-70%, no RWMA, normal RV, trivial MR, mild AI.   At visit 06/15/24 with Hao Meng, PA she was advised to stop Clonidine and potassium and start Spironolactone  50mg  daily.   Harriette Yogi was diagnosed with hypertension in her 30s or 57s. It has been difficult to control. She is not monitoring BP at home, concerns her home wrist cuff is not accurate. she reports tobacco use 0.5 PPD. For exercise she has no formal routine. she eats at home and does not add salt to foods however often eating high sodium foods such as frozen meals. Reports some concerns with food availability as lives with her husband, daughter, and 3 grandchildren who are 10, 38, and 17 years old.   Reports no shortness of breath nor dyspnea on exertion. Reports no chest pain, pressure, or tightness. No edema, orthopnea, PND. Reports no palpitations.  She has some dizziness related to vertigo on occasion.   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  Advised if needs it more regularly, to follow-up and discuss management furtehr    High cholesterol    Hyperlipidemia    Hypertension    Personal history of radiation therapy 2016   Radiation 07/13/15-08/03/15   right breast 4256 cGy   Seizures (HCC) 10/26/2017    Past Surgical History:  Procedure Laterality Date   BREAST BIOPSY Right 04/28/2015   BREAST LUMPECTOMY Right 06/09/2015   BREAST LUMPECTOMY WITH RADIOACTIVE SEED LOCALIZATION Right 06/09/2015   Procedure: RIGHT BREAST LUMPECTOMY WITH RADIOACTIVE SEED LOCALIZATION;  Surgeon: Debby Shipper, MD;  Location:  [facility];  Service: General;  Laterality: Right;   COLON SURGERY  07/17/2014   COLONOSCOPY     KNEE ARTHROSCOPY Right 2000   POLYPECTOMY  07/17/2014   polyp removed surgicallty    VAGINAL HYSTERECTOMY  2012    Current Medications: Current Meds  Medication Sig   ACETAMINOPHEN -BUTALBITAL 50-325 MG TABS Take 1 tablet by mouth every 4 (four) hours as needed (headache.).   B Complex Vitamins (B COMPLEX PO) Take 1 tablet by mouth daily at 12 noon. (B complex-C-E-zinc)   [EXPIRED] Blood Pressure Monitoring (COMFORT TOUCH BP CUFF/MEDIUM) MISC 1 Units by Does not apply  route once for 1 dose.   cetirizine (ZYRTEC) 10 MG tablet Take 10 mg by mouth daily.   levETIRAcetam  (KEPPRA ) 500 MG tablet Take 1 tablet (500 mg total) by mouth 2 (two) times daily.   metoprolol  succinate (TOPROL -XL) 100 MG 24 hr tablet Take 100 mg by mouth every morning. Take with or immediately following a meal.   Olmesartan -amLODIPine -HCTZ 40-10-25 MG TABS Take 1 tablet by mouth daily.   PARoxetine  (PAXIL ) 20 MG tablet Take 20 mg by mouth every morning.     pravastatin (PRAVACHOL) 40 MG tablet Take by mouth.   Prenatal Vit-Fe Fumarate-FA (PRENATAL PO) Take 1 tablet by mouth  daily at 12 noon.   [DISCONTINUED] amLODipine  (NORVASC ) 10 MG tablet Take 10 mg by mouth every evening. @@7 :30 pm   [DISCONTINUED] hydrochlorothiazide  25 MG tablet Take 25 mg by mouth every morning.    [DISCONTINUED] losartan  (COZAAR ) 100 MG tablet Take 100 mg by mouth every morning.     Allergies:   Amoxicillin, Aspirin, and Ibuprofen   Social History   Socioeconomic History   Marital status: Single    Spouse name: Not on file   Number of children: 1   Years of education: Not on file   Highest education level: High school graduate  Occupational History   Not on file  Tobacco Use   Smoking status: Every Day    Current packs/day: 0.50    Average packs/day: 0.5 packs/day for 32.0 years (16.0 ttl pk-yrs)    Types: Cigarettes   Smokeless tobacco: Never  Vaping Use   Vaping status: Never Used  Substance and Sexual Activity   Alcohol use: Yes    Alcohol/week: 0.0 standard drinks of alcohol    Comment: occas - maybe 1 drink every 3 mos   Drug use: No   Sexual activity: Not Currently    Birth control/protection: Surgical  Other Topics Concern   Not on file  Social History Narrative   Living w/ daughter-2 story home 15 steps   Right handed   Caffeine 2 cups a day   Social Drivers of Health   Financial Resource Strain: High Risk (07/05/2024)   Overall Financial Resource Strain (CARDIA)    Difficulty of Paying Living Expenses: Hard  Food Insecurity: Food Insecurity Present (07/05/2024)   Hunger Vital Sign    Worried About Running Out of Food in the Last Year: Sometimes true    Ran Out of Food in the Last Year: Often true  Transportation Needs: Unmet Transportation Needs (07/05/2024)   PRAPARE - Transportation    Lack of Transportation (Medical): No    Lack of Transportation (Non-Medical): Yes  Physical Activity: Inactive (07/05/2024)   Exercise Vital Sign    Days of Exercise per Week: 0 days    Minutes of Exercise per Session: 0 min  Stress: No Stress Concern Present  (07/05/2024)   [employer] of Occupational Health - Occupational Stress Questionnaire    Feeling of Stress: Not at all  Social Connections: Socially Isolated (07/05/2024)   Social Connection and Isolation Panel    Frequency of Communication with Friends and Family: Once a week    Frequency of Social Gatherings with Friends and Family: More than three times a week    Attends Religious Services: Never    Database Administrator or Organizations: No    Attends Banker Meetings: Never    Marital Status: Divorced     Family History: The patient's family history includes Breast cancer (age of onset:  45) in her sister; Colon polyps in her father; Dementia in her mother; Diabetes in her mother; Heart attack in her paternal uncle; Hypertension in her father; Lung cancer (age of onset: 18) in her father; Stomach cancer in her maternal uncle. There is no history of Colon cancer, Esophageal cancer, or Rectal cancer.  ROS:   Please see the history of present illness.     All other systems reviewed and are negative.  EKGs/Labs/Other Studies Reviewed:         Recent Labs: 03/22/2024: TSH 1.470 07/04/2024: BUN 18; Creatinine, Ser 0.88; Potassium 3.5; Sodium 143   Recent Lipid Panel    Component Value Date/Time   CHOL 173 10/27/2017 0430   TRIG 132 10/27/2017 0430   HDL 61 10/27/2017 0430   CHOLHDL 2.8 10/27/2017 0430   VLDL 26 10/27/2017 0430   LDLCALC 86 10/27/2017 0430    Physical Exam:   VS:  BP 131/85 (BP Location: Left Arm)   Pulse 78   Ht 5' 2.5 (1.588 m)   Wt 137 lb 12.8 oz (62.5 kg)   LMP 03/14/2011   SpO2 95%   BMI 24.80 kg/m  , BMI Body mass index is 24.8 kg/m.  Vitals:   07/05/24 0809 07/05/24 0810  BP: 124/86 131/85  Pulse: 78   Height: 5' 2.5 (1.588 m)   Weight: 137 lb 12.8 oz (62.5 kg)   SpO2: 95%   BMI (Calculated): 24.79     GENERAL:  Well appearing HEENT: Pupils equal round and reactive, fundi not visualized, oral mucosa  unremarkable NECK:  No jugular venous distention, waveform within normal limits, carotid upstroke brisk and symmetric, no bruits, no thyromegaly LYMPHATICS:  No cervical adenopathy LUNGS:  Clear to auscultation bilaterally HEART:  RRR.  PMI not displaced or sustained,S1 and S2 within normal limits, no S3, no S4, no clicks, no rubs, no murmurs ABD:  Flat, positive bowel sounds normal in frequency in pitch, no bruits, no rebound, no guarding, no midline pulsatile mass, no hepatomegaly, no splenomegaly EXT:  2 plus pulses throughout, no edema, no cyanosis no clubbing SKIN:  No rashes no nodules NEURO:  Cranial nerves II through XII grossly intact, motor grossly intact throughout PSYCH:  Cognitively intact, oriented to person place and time   ASSESSMENT/PLAN:    HTN - BP in clinic 124/86 and 131/85. Interestingly BP relatively controlled since discontinuation of Clonidine though she did not start Spironolactone  yet. Goal to get BP <130/80 as well as simplify regimen for adherence.  Stop losartan , amlodipine , hydrochlorothiazide  Start Olmesartan -Amlodipine -hydrochlorothiazide  40-10-25mg  daily BMP in 1-2 weeks Labs indeterminate for hyperaldosteronism. She prefers not to proceed with slat loading for confirmatory testing at this time. Interestingly, after stopping potassium supplement her potassium is normal on recent labs. If BP persistently >130/80, consider addition of Spironolactone .  Discussed to monitor BP at home at least 2 hours after medications and sitting for 5-10 minutes. Rx for BP cuff sent to [facility] for delivery to her home. Recommend aiming for 150 minutes of moderate intensity activity per week and following a heart healthy diet.   Food insecurity - Resources within her county provided as well as bag of food from [employer]  OSA - CPAP compliance encouraged.   Screening for Secondary Hypertension:     07/05/2024    3:00 PM  Causes  Renovascular HTN Screened      - Comments 03/2024 no RAS  Sleep Apnea Screened  Thyroid Disease Screened     - Comments  03/2024 normal TSH  Hyperaldosteronism Screened     - Comments 03/2024 labs indeterminate, politely declines to proceed with salt loading for confirmatory testing. could be reconsidered in future  Pheochromocytoma N/A     - Comments 2023 CT normal R adrenal, mildly thickened L adrenal, no adenomas  Cushing's Syndrome N/A     - Comments non cushingoid appearance  Coarctation of the Aorta N/A     - Comments BP symmetrical    Relevant Labs/Studies:    Latest Ref Rng & Units 07/04/2024   10:57 AM 03/22/2024   11:42 AM 10/03/2021   10:29 AM  Basic Labs  Sodium 134 - 144 mmol/L 143  142  135   Potassium 3.5 - 5.2 mmol/L 3.5  3.8  3.4   Creatinine 0.57 - 1.00 mg/dL 9.11  9.04  9.14        Latest Ref Rng & Units 03/22/2024   11:42 AM  Thyroid   TSH 0.450 - 4.500 uIU/mL 1.470        Latest Ref Rng & Units 03/22/2024   11:42 AM  Renin/Aldosterone   Aldosterone 0.0 - 30.0 ng/dL 87.7   Aldos/Renin Ratio 0.0 - 30.0 >73.1              04/10/2024    9:44 AM  Renovascular   Renal Artery US  Completed Yes     Disposition:    FU with MD/APP/PharmD in 2 months    Medication Adjustments/Labs and Tests Ordered: Current medicines are reviewed at length with the patient today.  Concerns regarding medicines are outlined above.  Orders Placed This Encounter  Procedures   Basic metabolic panel with GFR   Meds ordered this encounter  Medications   Olmesartan -amLODIPine -HCTZ 40-10-25 MG TABS    Sig: Take 1 tablet by mouth daily.    Dispense:  30 tablet    Refill:  5    Discontinue amlodipine , losartan , hydrochlorothiazide , spironolactone , clonidine, potassium    Supervising Provider:   CHRISTOPHER, BRIDGETTE [[id number]]   Blood Pressure Monitoring (COMFORT TOUCH BP CUFF/MEDIUM) MISC    Sig: 1 Units by Does not apply route once for 1 dose.    Dispense:  1 each    Refill:  0    Please mail to patient     Supervising Provider:   LONNI SLAIN [[id number]]     Signed, Reche GORMAN Finder, NP  07/06/2024 10:23 AM    [facility]

## 2024-07-05 NOTE — Patient Instructions (Signed)
 Medication Instructions:  STOP THE FOLLOWING  AMLODIPINE   Hydrochlorothiazide  LOSARTAN    DO  NOT START SPIRONOLACTONE    START OLMESARTAN -AMLODIPINE -HCT 40-10-25 MG DAILY   Labwork: BMET IN 1 TO 2 WEEKS  Testing/Procedures: NONE  Follow-Up: 2 MONTHS WITH CAITLIN W NP OR DR [place]   Any Other Special Instructions Will Be Listed Below (If Applicable). MONITOR YOUR BLOOD PRESSURE TWICE A DAY, LOG IN THE BOOK PROVIDED. BRING THE BOOK AND YOUR BLOOD PRESSURE MACHINE TO YOUR FOLLOW UP   If you need a refill on your cardiac medications before your next appointment, please call your pharmacy.

## 2024-07-06 ENCOUNTER — Encounter (HOSPITAL_BASED_OUTPATIENT_CLINIC_OR_DEPARTMENT_OTHER): Payer: Self-pay | Admitting: Family

## 2024-07-15 ENCOUNTER — Encounter: Payer: Self-pay | Admitting: Neurology

## 2024-07-15 ENCOUNTER — Ambulatory Visit: Admitting: Neurology

## 2024-07-15 VITALS — BP 134/86 | HR 80 | Ht 62.5 in | Wt 140.0 lb

## 2024-07-15 DIAGNOSIS — R251 Tremor, unspecified: Secondary | ICD-10-CM

## 2024-07-15 DIAGNOSIS — R569 Unspecified convulsions: Secondary | ICD-10-CM

## 2024-07-15 DIAGNOSIS — R519 Headache, unspecified: Secondary | ICD-10-CM

## 2024-07-15 DIAGNOSIS — G40009 Localization-related (focal) (partial) idiopathic epilepsy and epileptic syndromes with seizures of localized onset, not intractable, without status epilepticus: Secondary | ICD-10-CM

## 2024-07-15 MED ORDER — LEVETIRACETAM 500 MG PO TABS: 500.0000 mg | ORAL_TABLET | Freq: Two times a day (BID) | ORAL | 3 refills | Status: AC

## 2024-07-15 NOTE — Patient Instructions (Signed)
 Good to see you.  Continue Keppra  500mg  twice a day for seizure prevention  2. Start keeping a headache diary and monitor any triggers, continue working on sleep hygiene  3. Follow-up in 5 months, call for any changes   Seizure Precautions: 1. If medication has been prescribed for you to prevent seizures, take it exactly as directed.  Do not stop taking the medicine without talking to your doctor first, even if you have not had a seizure in a long time.   2. Avoid activities in which a seizure would cause danger to yourself or to others.  Don't operate dangerous machinery, swim alone, or climb in high or dangerous places, such as on ladders, roofs, or girders.  Do not drive unless your doctor says you may.  3. If you have any warning that you may have a seizure, lay down in a safe place where you can't hurt yourself.    4.  No driving for 6 months from last seizure, as per [place]  state law.   Please refer to the following link on the [employer]ile[employer] of [employer]'s website for more information: http://www.epilepsyfoundation.org/answerplace/Social/driving/drivingu.cfm   5.  Maintain good sleep hygiene. Avoid alcohol.  6.  Contact your doctor if you have any problems that may be related to the medicine you are taking.  7.  Call 911 and bring the patient back to the ED if:        A.  The seizure lasts longer than 5 minutes.       B.  The patient doesn't awaken shortly after the seizure  C.  The patient has new problems such as difficulty seeing, speaking or moving  D.  The patient was injured during the seizure  E.  The patient has a temperature over 102 F (39C)  F.  The patient vomited and now is having trouble breathing

## 2024-07-15 NOTE — Progress Notes (Signed)
 NEUROLOGY FOLLOW UP OFFICE NOTE  Eileen Holland [id number] Jun 14, 1961  Discussed the use of AI scribe software for clinical note transcription with the patient, who gave verbal consent to proceed.  History of Present Illness I had the pleasure of seeing Eileen Holland in follow-up in the neurology clinic on 62/08/2023.  The patient was last seen 3 months ago for several neurological issues. She is alone in the office today. Records and images were personally reviewed where available.  She has left temporal lobe epilepsy and has not had any seizures since 2023 on Levetiracetam  500mg  BID. She has a history of migraines but reported constant daily headaches different from typical migraines. She was also reporting dizziness, tremors, and memory changes. I personally reviewed MRI brain with and without contrast done 05/2024 which did not show any acute changes. There was mild chronic microvascular disease. Neurocognitive testing in 06/2024 did not indicate a neurodegenerative process. Pattern seen could reflect mild inefficiencies in executive functioning. It did not strongly localize to a specific brain region or lateralize to one hemisphere.   She experiences persistent headaches every other day, described as a sensation of her head 'popping'. Initially managed with Tylenol , she occasionally uses butalbital if needed. No associated nausea, vomiting, or photophobia or phonophobia. She has chronic neck pain, which she attributes to her sleeping/pillow arrangements. She performs neck exercises for relief.  She denies any focal numbness/tingling/weakness, no falls. She experiences morning tremors that subside throughout the day but worsen with writing. She suspects her medication regimen may contribute to these symptoms. Her sleep is disrupted, averaging four to five hours per night with frequent awakenings not related to nocturia. She attributes sleep disturbances to stress and describes mood changes as  circumstantial.     History on Initial Assessment 05/01/2024: This is a 63 year old right-handed woman with a history of hypertension, hyperlipidemia, breast cancer s/p lumpectomy and radiation, OSA, and left temporal lobe epilepsy. She reports several concerns today, particularly new onset headaches, dizziness, and memory loss.  Seizures. Records from [facility] were reviewed. The first seizure occurred in 10/2017. She only remembers pieces of it, she got up that morning, got ready for work, and took 2 buses to get to work. She vaguely recalls being unable to get herself together on the computer at work so she left. Her daughter picked her up at the bus station. When she got home, she found her pillow was bloodied and noticed her tongue was slit in half. She was admitted to [facility] where brain MRI without contrast did not show any acute changes, hippocampi symmetric. There were nonspecific white matter changes. CTA head and neck no large vessel stenosis. Her EEG showed occasional left frontotemporal spike and wave discharges. She was discharged home on Levetiracetam  500mg  BID. Since then, she reports only 2 nocturnal seizures both in the setting of medication issues. One nocturnal seizure was witnessed by her granddaughter with report of body shaking. The last one was in 04/2022 when she ran out of LEV for 24 hours and woke up with blood on her pillow, tongue sore, brain fog that resolved soon after she woke up. She denies any staring/unresponsive episodes, gaps in time, olfactory/gustatory hallucinations, rising epigastric sensation, focal numbness/tingling/weakness, myoclonic jerks. No side effects on LEV. She had a normal birth and early development.  There is no history of febrile convulsions, CNS infections such as meningitis/encephalitis, significant traumatic brain injury, neurosurgical procedures, or family history of seizures.  2.   Headaches and dizziness. She has  a history of migraines all my life with  visual symptoms of seeing stars. However in the past few weeks, she has had a constant headache that is different from her typical migraines. She has a weird headache on the back of her head where head and neck meet, as well as a pain above the left eye, no tenderness. Pain has been constant, she takes Tylenol  3 times a week and rarely Butalbital if the Tylenol  does not calm down the pain. No nausea/vomiting. Two weeks ago, she had visual changes seeing something similar to a bar code on both eyes (covered each eye and still present) lasting an hour. Since the headaches started, she has also been having a different type of dizziness. There is no spinning sensation. She calls it a clicking when she moves her eyeballs or head to either side. She has difficulty describing the sensation, the inside of her head is getting heavy. She feels like she will get lightheaded but not all the way. It affects her balance, she is concerned about walking on stairs. On primary position she is fine. Interestingly, she reports that when she was off Paxil  for a week, the clicking was worse. She restarted it last Saturday. There is no pain, no tinnitus. Her granddaughter tells her she has hearing loss but she has not noticed. No neck pain. No diplopia, dysarthria/dysphagia, focal numbness/tingling/weakness. No recent head injuries or infections, no new medications. She has chronic back pain and constipation. Her sister and daughter have migraines.  3. Tremors. She has had hand tremors in both hands, they come and go, noticeable when she is writing. They are more when she wakes up in the morning and quiets down during the day. It does not affect using utensils or drinking from a cup. Her mother has dementia and some tremors. TSH normal.  4. Memory changes. She started noticing short-term memory changes a year ago. She lives with her daughter and grandchildren. She denies getting lost driving or missing medications. Her daughter  manages finances. She denies leaving the stove on. She reports moving to [place] in October, when family talks about incidents from their prior home, she cannot recall them. Her mother and maternal aunt have dementia. She gets 5 hours of sleep with her CPAP machine. Mood is irritated due to the clicking.    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  Advised if needs it more regularly, to follow-up and discuss management furtehr    High cholesterol    Hyperlipidemia    Hypertension    Personal history of radiation therapy 2016   Radiation 07/13/15-08/03/15   right breast 4256 cGy   Seizures (HCC) 10/26/2017    MEDICATIONS: Current Outpatient Medications on File Prior to Visit  Medication Sig Dispense Refill   ACETAMINOPHEN -BUTALBITAL 50-325 MG TABS Take 1 tablet by mouth every 4 (four) hours as needed (headache.).  B Complex Vitamins (B COMPLEX PO) Take 1 tablet by mouth daily at 12 noon. (B complex-C-E-zinc)     cetirizine (ZYRTEC) 10 MG tablet Take 10 mg by mouth daily.     levETIRAcetam  (KEPPRA ) 500 MG tablet Take 1 tablet (500 mg total) by mouth 2 (two) times daily. 180 tablet 3   metoprolol  succinate (TOPROL -XL) 100 MG 24 hr tablet Take 100 mg by mouth every morning. Take with or immediately following a meal.     PARoxetine  (PAXIL ) 20 MG tablet Take 20 mg by mouth every morning.       pravastatin (PRAVACHOL) 40 MG tablet Take by mouth.     Prenatal Vit-Fe Fumarate-FA (PRENATAL PO) Take 1 tablet by  mouth daily at 12 noon.     Olmesartan -amLODIPine -HCTZ 40-10-25 MG TABS Take 1 tablet by mouth daily. 30 tablet 5   No current facility-administered medications on file prior to visit.    ALLERGIES: Allergies  Allergen Reactions   Amoxicillin Other (See Comments)    Unknown reaction   Aspirin Hives   Ibuprofen Hives    FAMILY HISTORY: Family History  Problem Relation Age of Onset   Diabetes Mother    Dementia Mother    Hypertension Father    Lung cancer Father 57       former smoker   Colon polyps Father        unspecified number   Breast cancer Sister 89       negative BRCA testing in 2007   Stomach cancer Maternal Uncle        dx. 50s-early 60s   Heart attack Paternal Uncle    Colon cancer Neg Hx    Esophageal cancer Neg Hx    Rectal cancer Neg Hx     SOCIAL HISTORY: Social History   Socioeconomic History   Marital status: Single    Spouse name: Not on file   Number of children: 1   Years of education: Not on file   Highest education level: High school graduate  Occupational History   Not on file  Tobacco Use   Smoking status: Every Day    Current packs/day: 0.50    Average packs/day: 0.5 packs/day for 32.0 years (16.0 ttl pk-yrs)    Types: Cigarettes   Smokeless tobacco: Never  Vaping Use   Vaping status: Never Used  Substance and Sexual Activity   Alcohol use: Yes    Alcohol/week: 0.0 standard drinks of alcohol    Comment: occas - maybe 1 drink every 3 mos   Drug use: No   Sexual activity: Not Currently    Birth control/protection: Surgical  Other Topics Concern   Not on file  Social History Narrative   Living w/ daughter-2 story home 15 steps   Right handed   Caffeine 2 cups a day   Social Drivers of Health   Financial Resource Strain: High Risk (07/05/2024)   Overall Financial Resource Strain (CARDIA)    Difficulty of Paying Living Expenses: Hard  Food Insecurity: Food Insecurity Present (07/05/2024)   Hunger Vital Sign    Worried About  Running Out of Food in the Last Year: Sometimes true    Ran Out of Food in the Last Year: Often true  Transportation Needs: Unmet Transportation Needs (07/05/2024)   PRAPARE - Transportation    Lack of Transportation (Medical): No    Lack of Transportation (Non-Medical): Yes  Physical Activity: Inactive (07/05/2024)   Exercise Vital Sign    Days of Exercise per Week:  0 days    Minutes of Exercise per Session: 0 min  Stress: No Stress Concern Present (07/05/2024)   [employer] of Occupational Health - Occupational Stress Questionnaire    Feeling of Stress: Not at all  Social Connections: Socially Isolated (07/05/2024)   Social Connection and Isolation Panel    Frequency of Communication with Friends and Family: Once a week    Frequency of Social Gatherings with Friends and Family: More than three times a week    Attends Religious Services: Never    Database Administrator or Organizations: No    Attends Banker Meetings: Never    Marital Status: Divorced  Catering Manager Violence: Unknown (11/16/2021)   Received from [facility]   HITS    Physically Hurt: Not on file    Insult or Talk Down To: Not on file    Threaten Physical Harm: Not on file    Scream or Curse: Not on file     PHYSICAL EXAM: Vitals:   07/15/24 1011  BP: 134/86  Pulse: 80  SpO2: 95%   General: No acute distress Head:  Normocephalic/atraumatic Skin/Extremities: No rash, no edema Neurological Exam: alert and awake. No aphasia or dysarthria. Fund of knowledge is appropriate.  Attention and concentration are normal.   Cranial nerves: Pupils equal, round. Extraocular movements intact with no nystagmus. Visual fields full.  No facial asymmetry.  Motor: Bulk and tone normal, muscle strength 5/5 throughout with no pronator drift.   Finger to nose testing intact.  Gait narrow-based and steady, no ataxia. No tremors in the office today.  Assessment & Plan This is a 63 yo RH woman with a history of  hypertension, hyperlipidemia, breast cancer s/p lumpectomy and radiation, OSA, and left temporal lobe epilepsy. On initial visit in 04/2024 she reported new onset headaches, dizziness, tremors, and memory loss. No tremor in office today.   She has been seizure-free since 2023 on Levetiracetam  500mg  BID, refills sent. She is aware of [place] driving laws to stop driving after a seizure until 6 months seizure-free.  Headaches occur every other day without migraine features. No acute changes on brain MRI. Current acetaminophen  use effective, but butalbital used sparingly. We discussed starting a daily headache preventative medication such as Gabapentin (which can also help with tremors) or amitriptyline. She prefers to avoid additional medications for now. - Keep headache diary to identify triggers. - Consider dietary modifications to avoid triggers. - Improve sleep hygiene and stress management. - Follow up in five months to review diary and discuss management.  Memory testing did not indicate a neurodegenerative process. We discussed results of Neurocognitive testing and how mood changes, sleep, headaches, and adjustment disorder affect cognition.     Thank you for allowing me to participate in her care.  Please do not hesitate to call for any questions or concerns.    Eileen Holland, M.D.   CC: Eileen Porch, PA-C

## 2024-08-22 ENCOUNTER — Ambulatory Visit: Admitting: Family Medicine

## 2024-09-10 ENCOUNTER — Ambulatory Visit: Admitting: Family Medicine

## 2024-09-11 NOTE — Progress Notes (Unsigned)
 "  [facility] Assessment:    Date:  09/12/2024   ID:  Eileen Holland, DOB Mar 16, 1961, MRN [id number]  PCP:  Buck Search, PA-C  Cardiologist:  Newman [name], MD  Nephrologist:  Referring MD: Buck Search, PA-C   CC: Hypertension  History of Present Illness:    Eileen Holland is a 64 y.o. female with a hx of HTN, tobacco use, OSA on CPAP here to follow up in the [facility].   CT 2023 with normal R adrenal, mild L adrenal thickening. Labs 8/825 with normal TSH. Renin < 0.167 and aldosterone 12.2 indeterminate for hyperaldosteronism.  Renal duplex 04/10/24 with no renal artery stenosis. Echo 04/10/24 LVEF 65-70%, no RWMA, normal RV, trivial MR, mild AI.   At visit 06/15/24 with Hao Meng, PA she was advised to stop Clonidine and potassium and start Spironolactone  50mg  daily.   Established with [facility] 07/05/24. [name] was diagnosed with hypertension in her 30s or 74s. Tobacco use 0.5 PPD. For exercise she had no formal routine. she was eating at home and does not add salt to foods however often eating high sodium foods such as frozen meals. BP was relatively controlled at 131/85 off Clonidine, had not yet started Spironolactone . She was advised to stop losartan , amlodipine , hydrochlorothiazide . She was started on olmesartan -amlodipine  -hydrochlorothiazide  40-10-25g daily. Labs were indeterminate for hyperaldosteronism, she politely deferred confirmatory testing with salt loading. It was noted spironolactone  could be re-considered in the future.   Presents today for follow up and reports feeling good. Her blood pressures at home have been ranging 120s-130s. Her last home reading 124/76. She has had no issues with the new combination pill form started at her last visit and reports that it has worked much better for her.   Her HR notably today is 58. She does have a pulse reading on her home BP machine, however she does not pay attention  to this reading. She denies any lethargy, fatigue, pre-syncope, or syncope.   Reports no shortness of breath nor on exertion. Reports no chest pain, pressure, or tightness. No edema, orthopnea, PND. Reports no palpitations.   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  Advised if needs it more regularly, to follow-up and discuss management furtehr    High cholesterol    Hyperlipidemia    Hypertension    Personal history of radiation therapy 2016   Radiation 07/13/15-08/03/15   right breast 4256 cGy   Seizures (HCC) 10/26/2017    Past Surgical History:  Procedure Laterality Date   BREAST BIOPSY Right 04/28/2015   BREAST LUMPECTOMY Right 06/09/2015   BREAST LUMPECTOMY WITH RADIOACTIVE SEED LOCALIZATION Right 06/09/2015   Procedure: RIGHT BREAST LUMPECTOMY WITH RADIOACTIVE SEED LOCALIZATION;  Surgeon: Debby Shipper, MD;  Location: [facility];  Service: General;  Laterality: Right;   COLON SURGERY  07/17/2014  COLONOSCOPY     KNEE ARTHROSCOPY Right 2000   POLYPECTOMY  07/17/2014   polyp removed surgicallty    VAGINAL HYSTERECTOMY  2012    Current Medications: Current Meds  Medication Sig   ACETAMINOPHEN -BUTALBITAL 50-325 MG TABS Take 1 tablet by mouth every 4 (four) hours as needed (headache.).   B Complex Vitamins (B COMPLEX PO) Take 1 tablet by mouth daily at 12 noon. (B complex-C-E-zinc)   cetirizine (ZYRTEC) 10 MG tablet Take 10 mg by mouth daily.    levETIRAcetam  (KEPPRA ) 500 MG tablet Take 1 tablet (500 mg total) by mouth 2 (two) times daily.   metoprolol  succinate (TOPROL -XL) 100 MG 24 hr tablet Take 100 mg by mouth every morning. Take with or immediately following a meal.   Olmesartan -amLODIPine -HCTZ 40-10-25 MG TABS Take 1 tablet by mouth daily.   PARoxetine  (PAXIL ) 20 MG tablet Take 20 mg by mouth every morning.     pravastatin (PRAVACHOL) 40 MG tablet Take by mouth.   Prenatal Vit-Fe Fumarate-FA (PRENATAL PO) Take 1 tablet by mouth daily at 12 noon.     Allergies:   Amoxicillin, Aspirin, and Ibuprofen   Social History   Socioeconomic History   Marital status: Single    Spouse name: Not on file   Number of children: 1   Years of education: Not on file   Highest education level: High school graduate  Occupational History   Not on file  Tobacco Use   Smoking status: Every Day    Current packs/day: 0.50    Average packs/day: 0.5 packs/day for 32.0 years (16.0 ttl pk-yrs)    Types: Cigarettes   Smokeless tobacco: Never  Vaping Use   Vaping status: Never Used  Substance and Sexual Activity   Alcohol use: Yes    Alcohol/week: 0.0 standard drinks of alcohol    Comment: occas - maybe 1 drink every 3 mos   Drug use: No   Sexual activity: Not Currently    Birth control/protection: Surgical  Other Topics Concern   Not on file  Social History Narrative   Living w/ daughter-2 story home 15 steps   Right handed   Caffeine 2 cups a day   Social Drivers of Health   Tobacco Use: High Risk (07/15/2024)   Patient History    Smoking Tobacco Use: Every Day    Smokeless Tobacco Use: Never    Passive Exposure: Not on file  Financial Resource Strain: High Risk (07/05/2024)   Overall Financial Resource Strain (CARDIA)    Difficulty of Paying Living Expenses: Hard  Food Insecurity: Food Insecurity Present (07/05/2024)   Epic    Worried About Programme Researcher, Broadcasting/film/video in the Last Year: Sometimes true    Ran Out of Food in the Last  Year: Often true  Transportation Needs: Unmet Transportation Needs (07/05/2024)   Epic    Lack of Transportation (Medical): No    Lack of Transportation (Non-Medical): Yes  Physical Activity: Inactive (07/05/2024)   Exercise Vital Sign    Days of Exercise per Week: 0 days    Minutes of Exercise per Session: 0 min  Stress: No Stress Concern Present (07/05/2024)   [employer] of Occupational Health - Occupational Stress Questionnaire    Feeling of Stress: Not at all  Social Connections: Socially Isolated (07/05/2024)   Social Connection and Isolation Panel    Frequency of Communication with Friends and Family: Once a week    Frequency of Social Gatherings with Friends and Family: More than three times  a week    Attends Religious Services: Never    Active Member of Clubs or Organizations: No    Attends Banker Meetings: Never    Marital Status: Divorced  Depression (PHQ2-9): High Risk (07/05/2024)   Depression (PHQ2-9)    PHQ-2 Score: 12  Alcohol Screen: Low Risk (07/05/2024)   Alcohol Screen    Last Alcohol Screening Score (AUDIT): 1  Housing: Low Risk (07/05/2024)   Epic    Unable to Pay for Housing in the Last Year: No    Number of Times Moved in the Last Year: 1    Homeless in the Last Year: No  Utilities: At Risk (07/05/2024)   Epic    Threatened with loss of utilities: Yes  Health Literacy: Adequate Health Literacy (07/05/2024)   B1300 Health Literacy    Frequency of need for help with medical instructions: Never     Family History: The patient's family history includes Breast cancer (age of onset: 26) in her sister; Colon polyps in her father; Dementia in her mother; Diabetes in her mother; Heart attack in her paternal uncle; Hypertension in her father; Lung cancer (age of onset: 54) in her father; Stomach cancer in her maternal uncle. There is no history of Colon cancer, Esophageal cancer, or Rectal cancer.  ROS:   Please see the history of present  illness.     All other systems reviewed and are negative.  EKGs/Labs/Other Studies Reviewed:         Recent Labs: 03/22/2024: TSH 1.470 07/04/2024: BUN 18; Creatinine, Ser 0.88; Potassium 3.5; Sodium 143   Recent Lipid Panel    Component Value Date/Time   CHOL 173 10/27/2017 0430   TRIG 132 10/27/2017 0430   HDL 61 10/27/2017 0430   CHOLHDL 2.8 10/27/2017 0430   VLDL 26 10/27/2017 0430   LDLCALC 86 10/27/2017 0430    Physical Exam:   VS:  BP 120/78 (BP Location: Right Arm, Patient Position: Sitting, Cuff Size: Normal)   Pulse (!) 58   Ht 5' 2 (1.575 m)   Wt 143 lb 12.8 oz (65.2 kg)   LMP 02/21/2011   SpO2 97%   BMI 26.30 kg/m  , BMI Body mass index is 26.3 kg/m.  Vitals:   09/12/24 0814  BP: 120/78  Pulse: (!) 58  Height: 5' 2 (1.575 m)  Weight: 143 lb 12.8 oz (65.2 kg)  SpO2: 97%  BMI (Calculated): 26.29   GENERAL:  Well appearing HEENT: Pupils equal round and reactive, fundi not visualized, oral mucosa unremarkable NECK:  No jugular venous distention, waveform within normal limits, carotid upstroke brisk and symmetric, no bruits, no thyromegaly LYMPHATICS:  No cervical adenopathy LUNGS:  Clear to auscultation bilaterally HEART:  RRR.  PMI not displaced or sustained,S1 and S2 within normal limits, no S3, no S4, no clicks, no rubs, no murmurs ABD:  Flat, positive bowel sounds normal in frequency in pitch, no bruits, no rebound, no guarding, no midline pulsatile mass, no hepatomegaly, no splenomegaly EXT:  2 plus pulses throughout, no edema, no cyanosis no clubbing SKIN:  No rashes no nodules NEURO:  Cranial nerves II through XII grossly intact, motor grossly intact throughout PSYCH:  Cognitively intact, oriented to person place and time   ASSESSMENT/PLAN:    HTN - BP in clinic 120/78. Reports similar numbers at home. Goal is <130/80. New simplified regimen is working well for her.  Labs indeterminate for hyperaldosteronism. She previously elected not to  proceed with slat loading for confirmatory  testing at this time. Spironolactone  was never started. If BP persistently >130/80, consider addition of Spironolactone .  Continue Olmesartan -Amlodipine -hydrochlorothiazide  40-10-25mg  daily. Wrote for 90 day supply refill to help with reduced pharmacy visits.  BMET today to check kidney function after medication change. Previously was taking potassium while on clonidine. She was wondering today if she still needed to take this again. Last K was 3.5 so not needed if stable.  Recommend aiming for 150 minutes of moderate intensity activity per week and following a heart healthy diet.   2. Bradycardia - HR in the office is 58. Previous visits HR was 70-80s. She is on Toprol -XL 100 daily, started previously for HTN. She does have a pulse reader on her home BP machine, however she has not noticed any trends with this. She denies andy lethargy, fatigue, pre-syncope, or syncope. Discussed starting to trend HR at home and if she is maintaining HR <55 or is symptomatic to reach out. She may need dose titration.   OSA - CPAP compliance encouraged.   Screening for Secondary Hypertension:     07/05/2024    3:00 PM  Causes  Renovascular HTN Screened     - Comments 03/2024 no RAS  Sleep Apnea Screened  Thyroid Disease Screened     - Comments 03/2024 normal TSH  Hyperaldosteronism Screened     - Comments 03/2024 labs indeterminate, politely declines to proceed with salt loading for confirmatory testing. could be reconsidered in future  Pheochromocytoma N/A     - Comments 2023 CT normal R adrenal, mildly thickened L adrenal, no adenomas  Cushing's Syndrome N/A     - Comments non cushingoid appearance  Coarctation of the Aorta N/A     - Comments BP symmetrical    Relevant Labs/Studies:    Latest Ref Rng & Units 07/04/2024   10:57 AM 03/22/2024   11:42 AM 10/03/2021   10:29 AM  Basic Labs  Sodium 134 - 144 mmol/L 143  142  135   Potassium 3.5 - 5.2 mmol/L 3.5   3.8  3.4   Creatinine 0.57 - 1.00 mg/dL 9.11  9.04  9.14        Latest Ref Rng & Units 03/22/2024   11:42 AM  Thyroid   TSH 0.450 - 4.500 uIU/mL 1.470        Latest Ref Rng & Units 03/22/2024   11:42 AM  Renin/Aldosterone   Aldosterone 0.0 - 30.0 ng/dL 87.7   Aldos/Renin Ratio 0.0 - 30.0 >73.1              04/10/2024    9:44 AM  Renovascular   Renal Artery US  Completed Yes     Disposition:    FU with cardiology/HTN clinic as needed   Medication Adjustments/Labs and Tests Ordered: Current medicines are reviewed at length with the patient today.  Concerns regarding medicines are outlined above.  No orders of the defined types were placed in this encounter.  No orders of the defined types were placed in this encounter.    Signed, Reche GORMAN Finder, NP  09/12/2024 8:21 AM    [facility] "

## 2024-09-12 ENCOUNTER — Ambulatory Visit (INDEPENDENT_AMBULATORY_CARE_PROVIDER_SITE_OTHER): Admitting: Family

## 2024-09-12 ENCOUNTER — Encounter (HOSPITAL_BASED_OUTPATIENT_CLINIC_OR_DEPARTMENT_OTHER): Payer: Self-pay | Admitting: Family

## 2024-09-12 VITALS — BP 120/78 | HR 58 | Ht 62.0 in | Wt 143.8 lb

## 2024-09-12 DIAGNOSIS — R001 Bradycardia, unspecified: Secondary | ICD-10-CM

## 2024-09-12 DIAGNOSIS — I10 Essential (primary) hypertension: Secondary | ICD-10-CM

## 2024-09-12 DIAGNOSIS — G4733 Obstructive sleep apnea (adult) (pediatric): Secondary | ICD-10-CM

## 2024-09-12 MED ORDER — OLMESARTAN-AMLODIPINE-HCTZ 40-10-25 MG PO TABS: 1.0000 | ORAL_TABLET | Freq: Every day | ORAL | 3 refills | Status: AC

## 2024-09-12 NOTE — Patient Instructions (Addendum)
 Medication Instructions:  Continue your current medications   Follow-Up: Please follow up as needed with cardiology and [facility]    Special Instructions:    Our goal is for your heart rate to be 55-99 bpm when sitting and resting.  If you are getting heart rates consistently less than 55 bpm or if you are experiencing frequent lightheadedness, dizziness please call and let us  know.  Our goal is for your blood pressure to be less than 130/80 - your BP today looked great!

## 2024-09-13 ENCOUNTER — Ambulatory Visit (HOSPITAL_BASED_OUTPATIENT_CLINIC_OR_DEPARTMENT_OTHER): Payer: Self-pay | Admitting: Family

## 2024-09-13 LAB — BASIC METABOLIC PANEL WITH GFR
BUN/Creatinine Ratio: 12 (ref 12–28)
BUN: 12 mg/dL (ref 8–27)
CO2: 27 mmol/L (ref 20–29)
Calcium: 10 mg/dL (ref 8.7–10.3)
Chloride: 100 mmol/L (ref 96–106)
Creatinine, Ser: 0.98 mg/dL (ref 0.57–1.00)
Glucose: 100 mg/dL — ABNORMAL HIGH (ref 70–99)
Potassium: 3.7 mmol/L (ref 3.5–5.2)
Sodium: 142 mmol/L (ref 134–144)
eGFR: 65 mL/min/{1.73_m2}

## 2024-09-16 ENCOUNTER — Encounter: Payer: Self-pay | Admitting: Gastroenterology

## 2024-09-17 ENCOUNTER — Ambulatory Visit: Admitting: Family Medicine

## 2024-12-26 ENCOUNTER — Ambulatory Visit: Admitting: Neurology
